# Patient Record
Sex: Male | Born: 1965 | Race: White | Hispanic: No | Marital: Married | Smoking: Former smoker
Health system: Southern US, Community
[De-identification: ages and names within clinical notes are randomized; demographics above are authoritative.]

## PROBLEM LIST (undated history)

## (undated) DIAGNOSIS — G4730 Sleep apnea, unspecified: Secondary | ICD-10-CM

## (undated) DIAGNOSIS — Z9989 Dependence on other enabling machines and devices: Secondary | ICD-10-CM

## (undated) DIAGNOSIS — E119 Type 2 diabetes mellitus without complications: Secondary | ICD-10-CM

## (undated) DIAGNOSIS — M4800 Spinal stenosis, site unspecified: Secondary | ICD-10-CM

## (undated) DIAGNOSIS — K9041 Non-celiac gluten sensitivity: Secondary | ICD-10-CM

## (undated) DIAGNOSIS — R7989 Other specified abnormal findings of blood chemistry: Secondary | ICD-10-CM

## (undated) DIAGNOSIS — K279 Peptic ulcer, site unspecified, unspecified as acute or chronic, without hemorrhage or perforation: Secondary | ICD-10-CM

## (undated) DIAGNOSIS — E785 Hyperlipidemia, unspecified: Secondary | ICD-10-CM

## (undated) DIAGNOSIS — S3992XA Unspecified injury of lower back, initial encounter: Secondary | ICD-10-CM

## (undated) DIAGNOSIS — F32A Depression, unspecified: Secondary | ICD-10-CM

## (undated) DIAGNOSIS — T7840XA Allergy, unspecified, initial encounter: Secondary | ICD-10-CM

## (undated) DIAGNOSIS — M1990 Unspecified osteoarthritis, unspecified site: Secondary | ICD-10-CM

## (undated) DIAGNOSIS — G4733 Obstructive sleep apnea (adult) (pediatric): Secondary | ICD-10-CM

## (undated) DIAGNOSIS — F329 Major depressive disorder, single episode, unspecified: Secondary | ICD-10-CM

## (undated) DIAGNOSIS — R0602 Shortness of breath: Secondary | ICD-10-CM

## (undated) DIAGNOSIS — Z6841 Body Mass Index (BMI) 40.0 and over, adult: Secondary | ICD-10-CM

## (undated) DIAGNOSIS — M21969 Unspecified acquired deformity of unspecified lower leg: Secondary | ICD-10-CM

## (undated) DIAGNOSIS — K219 Gastro-esophageal reflux disease without esophagitis: Secondary | ICD-10-CM

## (undated) HISTORY — DX: Obstructive sleep apnea (adult) (pediatric): G47.33

## (undated) HISTORY — PX: COLONOSCOPY: SHX174

## (undated) HISTORY — PX: BLADDER SURGERY: SHX569

## (undated) HISTORY — DX: Peptic ulcer, site unspecified, unspecified as acute or chronic, without hemorrhage or perforation: K27.9

## (undated) HISTORY — DX: Other specified abnormal findings of blood chemistry: R79.89

## (undated) HISTORY — DX: Depression, unspecified: F32.A

## (undated) HISTORY — DX: Major depressive disorder, single episode, unspecified: F32.9

## (undated) HISTORY — DX: Hyperlipidemia, unspecified: E78.5

## (undated) HISTORY — DX: Dependence on other enabling machines and devices: Z99.89

## (undated) HISTORY — DX: Gastro-esophageal reflux disease without esophagitis: K21.9

## (undated) HISTORY — DX: Unspecified acquired deformity of unspecified lower leg: M21.969

## (undated) HISTORY — DX: Allergy, unspecified, initial encounter: T78.40XA

## (undated) HISTORY — PX: ESOPHAGOGASTRODUODENOSCOPY: SHX1529

---

## 2006-02-26 ENCOUNTER — Emergency Department (HOSPITAL_COMMUNITY): Admission: EM | Admit: 2006-02-26 | Discharge: 2006-02-27 | Payer: Self-pay | Admitting: Emergency Medicine

## 2012-11-22 ENCOUNTER — Encounter (HOSPITAL_COMMUNITY): Payer: Self-pay | Admitting: Emergency Medicine

## 2012-11-22 ENCOUNTER — Observation Stay (HOSPITAL_COMMUNITY)
Admission: EM | Admit: 2012-11-22 | Discharge: 2012-11-23 | Disposition: A | Discharge status: Home / Self Care | Payer: BC Managed Care – PPO | Attending: Internal Medicine | Admitting: Internal Medicine

## 2012-11-22 ENCOUNTER — Other Ambulatory Visit: Payer: Self-pay

## 2012-11-22 ENCOUNTER — Emergency Department (HOSPITAL_COMMUNITY): Payer: BC Managed Care – PPO

## 2012-11-22 DIAGNOSIS — R079 Chest pain, unspecified: Secondary | ICD-10-CM | POA: Diagnosis present

## 2012-11-22 DIAGNOSIS — M4800 Spinal stenosis, site unspecified: Secondary | ICD-10-CM | POA: Insufficient documentation

## 2012-11-22 DIAGNOSIS — Z880 Allergy status to penicillin: Secondary | ICD-10-CM | POA: Insufficient documentation

## 2012-11-22 DIAGNOSIS — R0789 Other chest pain: Principal | ICD-10-CM | POA: Insufficient documentation

## 2012-11-22 DIAGNOSIS — Z87828 Personal history of other (healed) physical injury and trauma: Secondary | ICD-10-CM | POA: Insufficient documentation

## 2012-11-22 DIAGNOSIS — E669 Obesity, unspecified: Secondary | ICD-10-CM

## 2012-11-22 DIAGNOSIS — G4730 Sleep apnea, unspecified: Secondary | ICD-10-CM | POA: Diagnosis present

## 2012-11-22 DIAGNOSIS — E119 Type 2 diabetes mellitus without complications: Secondary | ICD-10-CM | POA: Diagnosis present

## 2012-11-22 DIAGNOSIS — M129 Arthropathy, unspecified: Secondary | ICD-10-CM | POA: Insufficient documentation

## 2012-11-22 HISTORY — DX: Type 2 diabetes mellitus without complications: E11.9

## 2012-11-22 HISTORY — DX: Spinal stenosis, site unspecified: M48.00

## 2012-11-22 HISTORY — DX: Morbid (severe) obesity due to excess calories: E66.01

## 2012-11-22 HISTORY — DX: Shortness of breath: R06.02

## 2012-11-22 HISTORY — DX: Sleep apnea, unspecified: G47.30

## 2012-11-22 HISTORY — DX: Unspecified osteoarthritis, unspecified site: M19.90

## 2012-11-22 HISTORY — DX: Unspecified injury of lower back, initial encounter: S39.92XA

## 2012-11-22 HISTORY — DX: Non-celiac gluten sensitivity: K90.41

## 2012-11-22 HISTORY — DX: Body Mass Index (BMI) 40.0 and over, adult: Z684

## 2012-11-22 LAB — RAPID URINE DRUG SCREEN, HOSP PERFORMED
Amphetamines: NOT DETECTED
Benzodiazepines: NOT DETECTED
Opiates: NOT DETECTED

## 2012-11-22 LAB — BASIC METABOLIC PANEL
CO2: 26 mEq/L (ref 19–32)
Calcium: 9.4 mg/dL (ref 8.4–10.5)
Creatinine, Ser: 0.75 mg/dL (ref 0.50–1.35)
GFR calc Af Amer: >90 mL/min (ref >90)
Glucose, Bld: 365 mg/dL — ABNORMAL HIGH (ref 70–99)
Potassium: 4.3 mEq/L (ref 3.5–5.1)
Sodium: 135 mEq/L (ref 135–145)

## 2012-11-22 LAB — CBC: MCV: 88.5 fL (ref 78.0–100.0)

## 2012-11-22 LAB — POCT I-STAT TROPONIN I: Troponin i, poc: 0.01 ng/mL (ref 0.00–0.08)

## 2012-11-22 LAB — GLUCOSE, CAPILLARY: Glucose-Capillary: 250 mg/dL — ABNORMAL HIGH (ref 70–99)

## 2012-11-22 LAB — PRO B NATRIURETIC PEPTIDE: Pro B Natriuretic peptide (BNP): 7.4 pg/mL (ref 0–125)

## 2012-11-22 MED ORDER — ACETAMINOPHEN 325 MG PO TABS
650.0000 mg | ORAL_TABLET | ORAL | Status: DC | PRN
  Administered 2012-11-23: 650 mg via ORAL
  Filled 2012-11-22: qty 2

## 2012-11-22 MED ORDER — ASPIRIN 81 MG PO CHEW
81.0000 mg | CHEWABLE_TABLET | Freq: Every day | ORAL | Status: DC
  Administered 2012-11-23: 81 mg via ORAL
  Filled 2012-11-22: qty 1

## 2012-11-22 MED ORDER — NITROGLYCERIN 0.4 MG SL SUBL: 0.4000 mg | SUBLINGUAL_TABLET | SUBLINGUAL | Status: DC | PRN

## 2012-11-22 MED ORDER — ONDANSETRON HCL 4 MG/2ML IJ SOLN: 4.0000 mg | Freq: Four times a day (QID) | INTRAMUSCULAR | Status: DC | PRN

## 2012-11-22 MED ORDER — INSULIN ASPART 100 UNIT/ML ~~LOC~~ SOLN: 0.0000 [IU] | Freq: Three times a day (TID) | SUBCUTANEOUS | Status: DC

## 2012-11-22 MED ORDER — MORPHINE SULFATE 2 MG/ML IJ SOLN: 2.0000 mg | INTRAMUSCULAR | Status: DC | PRN

## 2012-11-22 MED ORDER — ENOXAPARIN SODIUM 40 MG/0.4ML ~~LOC~~ SOLN
40.0000 mg | SUBCUTANEOUS | Status: DC
  Filled 2012-11-22 (×2): qty 0.4

## 2012-11-22 NOTE — ED Notes (Signed)
MD at bedside. 

## 2012-11-22 NOTE — ED Notes (Signed)
Presents with sternal chest pressure that began this afternoon while going up and down stairs at work, associated with SOB, nausea and left arm tingling, dizziness. Rest makes pain better. Pain rated 5/10.  HX DM, mother died at early age of MI.

## 2012-11-22 NOTE — ED Notes (Signed)
Pt states he has been experiencing chest pain for a couple of hours centralized. States pain feels like a pressure. Not radiating. Pt denies cardiac hx. Hx of DM. Denies SOB at this time but states he did have SOB a little earlier.

## 2012-11-22 NOTE — ED Notes (Signed)
Gave pt. Warm blanket.

## 2012-11-22 NOTE — ED Provider Notes (Signed)
CSN: [id number]     Arrival date & time 11/22/12  1750 History   First MD Initiated Contact with Patient 11/22/12 1901     Chief Complaint  Patient presents with  . Chest Pain   (Consider location/radiation/quality/duration/timing/severity/associated sxs/prior Treatment) HPI Pt reports having to walk up several flights of stairs during work todayOnset was today at 4 PM while talking on phone with wife.  The pain is mid, located to central chest, described as pressure without radiation. Modifying factors: better with rest.  Associated symptoms: nausea, left hand tingling.  Recent medical care: went to urgent care who did EKG, gave ASA and sent here.   Past Medical History  Diagnosis Date  . Diabetes mellitus without complication   . Arthritis   . Spinal stenosis   . Back injury    History reviewed. No pertinent past surgical history. History reviewed. No pertinent family history. History  Substance Use Topics  . Smoking status: Never Smoker   . Smokeless tobacco: Not on file  . Alcohol Use: No    Review of Systems Constitutional: Negative for fever.  Eyes: Negative for vision loss.  ENT: Negative for difficulty swallowing.  Cardiovascular: Positive for chest pain. Respiratory: Negative for respiratory distress.  Gastrointestinal:  Negative for vomiting.  Genitourinary: Negative for inability to void.  Musculoskeletal: Negative for gait problem.  Integumentary: Negative for rash.  Neurological: Negative for new focal weakness.     Allergies  Penicillins and Gluten meal  Home Medications  No current outpatient prescriptions on file. BP 131/73  Pulse 77  Temp(Src) 98.3 F (36.8 C) (Oral)  Resp 18  Wt 344 lb (156.037 kg)  SpO2 98% Physical Exam Nursing note and vitals reviewed.  Constitutional: Pt is alert and appears stated age. Obese.  Eyes: No injection, no scleral icterus. HENT: Atraumatic, airway open without erythema or exudate.  Respiratory: No respiratory  distress. Equal breathing bilaterally. Cardiovascular: Normal rate. Extremities warm and well perfused.  Abdomen: Soft, non-tender. MSK: Extremities are atraumatic without deformity. Skin: No rash, no wounds.   Neuro: No motor nor sensory deficit.     ED Course  Procedures (including critical care time) Labs Review Labs Reviewed  CBC - Abnormal; Notable for the following:    MCHC 36.4 (*)    All other components within normal limits  BASIC METABOLIC PANEL - Abnormal; Notable for the following:    Glucose, Bld 365 (*)    All other components within normal limits  PRO B NATRIURETIC PEPTIDE  POCT I-STAT TROPONIN I   Imaging Review Dg Chest 2 View  11/22/2012   CLINICAL DATA:  Chest pain, diabetes  EXAM: CHEST  2 VIEW  COMPARISON:  None.  FINDINGS: Cardiomediastinal silhouette is unremarkable. Mild degenerative changes thoracic spine. No acute infiltrate or pleural effusion. No pulmonary edema. Minimal thoracic dextroscoliosis.  IMPRESSION: No active disease. Minimal thoracic dextroscoliosis. Mild degenerative changes thoracic spine.   Electronically Signed   By: Natasha Mead M.D.   On: 11/22/2012 19:09    EKG Interpretation   None       MDM   1. Chest pain    47 y.o. male w/ PMHx of DM, obesity, family h/o cardiac disease presents w/ chest pain concerning for possible ACS. Pt medium risk. HEART score 4 given moderately suspicious story, non specific EKG changes, 3 risk factors. Troponin normal. CXR normal. Other labs normal. Not c/w PE or dissection. Received aspirin at urgent care. Pt does not complain of chest pain here. Pt  and wife updated. Plan for chest pain observation. Medicine called.       I independently viewed, interpreted, and used in my medical decision making all ordered lab and imaging tests. Medical Decision Making discussed with ED attending Darlys Gales, MD      Charm Barges, MD 11/22/12 2026

## 2012-11-22 NOTE — H&P (Signed)
Date: 11/22/2012               Patient Name:  Caleb White MRN: [id number]  DOB: [birth year] Age / Sex: 47 y.o., male   PCP: No primary provider on file.         Medical Service: Internal Medicine Teaching Service         Attending Physician: Dr. Aletta Edouard, MD    First Contact: Dr. Andrey Campanile Pager: [phone]  Second Contact: Dr. Burtis Junes Pager: [phone]       After Hours (After 5p/  First Contact Pager: [phone]  weekends / holidays): Second Contact Pager: [phone]   Chief Complaint: Chest Pain  History of Present Illness: Caleb White is a 47 year old male with a PMH of uncontrolled T2DM, HTN, OSA, Modbid obesity.  He presents today to [facility] after being sent from Urgent care for chest pain.  He reports that earlier this afternoon he had to walk up and down several flights of stairs at work (this is not all that unusual for him) after work he was talking on the phone to his wife who reported he was breathing differently.  He did admit some chest pain to her and she had him go to UC.  He reports that the chest pain was substernal, located in mid chest and 7/10 at worst, it did not radiate.  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  He also notes that his pain is worse with deep inspiration.  At the time of chest pain he does admit dizziness, light headedness, mild nausea.  He reports that the chest pain resolved at Urgent Care after receiving ASA and being placed on supplemental O2.  He currently denies any chest pain unless he takes a deep breath.  His wife reports he seems to be SOB or breath differently about once a week.  However he reports that the last time he had chest pain like this was about 8 years ago.  He was seen at [facility] for this pain and reported that his enzymes were "weird" but that everything was fine with his heart.  He denies any history of cardiac  catheretization or stress testing. He reports the last time he saw his PCP was about 3-4 years ago.  He does not currently take any prescription medications but has been prescribed Glipizide, metformin, tramadol, enalapril, and androgel in the past.  He reports he stopped taking metformin due to his gluten sensitivity and that it gave him similar side effects.   Meds: No current facility-administered medications for this encounter.   No current outpatient prescriptions on file.    Allergies: Allergies as of 11/22/2012 - Review Complete 11/22/2012  Allergen Reaction Noted  . Penicillins Anaphylaxis 11/22/2012  . Gluten meal  11/22/2012   Past Medical History  Diagnosis Date  . Diabetes mellitus without complication   . Spinal stenosis   . Back injury     per patient, history of ruptured or torn disk   . Sleep apnea     on CPAP  . Arthritis     per patient  . Gluten intolerance     self diagnosed, no h/o biopsy or blood testing  . Morbid obesity with BMI of 50.0-59.9, adult    Past Surgical History  Procedure Laterality Date  . Bladder surgery      at 47 years old, patient  unaware of details   Family History  Problem Relation Age of Onset  . Heart attack Mother   . Heart failure Mother   . Heart attack Father   . Heart disease Brother   . Diabetes Mother   . Diabetes Brother   . Hypertension Father   . Clotting disorder Paternal Aunt     Blood clot after minor surgery  . Clotting disorder Paternal Grandmother     Blood clot after minor surgery   History   Social History  . Marital Status: Single    Spouse Name: Loleta Chance    Number of Children: 3  . Years of Education: college   Occupational History  . Sign language interpretor    Social History Main Topics  . Smoking status: Former Smoker -- 1.00 packs/day for 7 years    Types: Cigarettes    Quit date: 09/22/2012  . Smokeless tobacco: Not on file  . Alcohol Use: Yes     Comment: Rarely  . Drug Use:  No  . Sexual Activity: Yes    Partners: Female   Other Topics Concern  . Not on file   Social History Narrative  . No narrative on file    Review of Systems: Review of Systems  Constitutional: Positive for diaphoresis. Negative for fever, chills, weight loss and malaise/fatigue.  HENT: Negative for tinnitus.   Eyes: Negative for blurred vision.  Respiratory: Positive for shortness of breath. Negative for cough, sputum production and wheezing.   Cardiovascular: Positive for chest pain and leg swelling. Negative for palpitations.  Gastrointestinal: Positive for nausea and diarrhea. Negative for heartburn, vomiting, abdominal pain, constipation, blood in stool and melena.  Genitourinary: Positive for frequency. Negative for dysuria and urgency.  Musculoskeletal: Positive for back pain (chronic) and joint pain (chronic). Negative for myalgias.  Neurological: Negative for dizziness, tingling, loss of consciousness and headaches.  Endo/Heme/Allergies: Negative for polydipsia.  Psychiatric/Behavioral: Negative for depression and substance abuse. The patient is not nervous/anxious.      Physical Exam: Blood pressure 131/73, pulse 77, temperature 98.3 F (36.8 C), temperature source Oral, resp. rate 18, height 5\' 7"  (1.702 m), weight 344 lb (156.037 kg), SpO2 98.00%. on RA Physical Exam  Nursing note and vitals reviewed. Constitutional: He is oriented to person, place, and time and well-developed, well-nourished, and in no distress. No distress.  HENT:  Head: Normocephalic and atraumatic.  Eyes: EOM are normal.  Cardiovascular: Normal rate, regular rhythm, normal heart sounds and intact distal pulses.   No murmur heard. Pulmonary/Chest: Effort normal and breath sounds normal. No respiratory distress. He has no wheezes. He has no rales. He exhibits no tenderness.  Abdominal: Soft. Bowel sounds are normal. He exhibits no distension. There is tenderness (Left subchrondral tenderness with  deep palpation). There is no rebound and no guarding.  Musculoskeletal: He exhibits no edema.  Neurological: He is alert and oriented to person, place, and time.  Skin: Skin is warm and dry. He is not diaphoretic.  Psychiatric: Mood, memory and affect normal.    Lab results: Basic Metabolic Panel:  Recent Labs  16/10/96 1820  NA 135  K 4.3  CL 98  CO2 26  GLUCOSE 365*  BUN 8  CREATININE 0.75  CALCIUM 9.4  AG: 11 CBC:  Recent Labs  11/22/12 1820  WBC 6.8  HGB 16.5  HCT 45.3  MCV 88.5  PLT 259   BNP:  Recent Labs  11/22/12 1820  PROBNP 7.4    Imaging results:  Dg Chest 2 View  11/22/2012   CLINICAL DATA:  Chest pain, diabetes  EXAM: CHEST  2 VIEW  COMPARISON:  None.  FINDINGS: Cardiomediastinal silhouette is unremarkable. Mild degenerative changes thoracic spine. No acute infiltrate or pleural effusion. No pulmonary edema. Minimal thoracic dextroscoliosis.  IMPRESSION: No active disease. Minimal thoracic dextroscoliosis. Mild degenerative changes thoracic spine.   Electronically Signed   By: Natasha Mead M.D.   On: 11/22/2012 19:09    Other results: EKG: NSR, Rate 80, Normal Axis, T wave inversion in lead III, no available for comparison   Assessment & Plan by Problem: [name] is a 47 yo male with PMH of uncontrolled DM, HTN, OSA, Obesity, who presents with chest pain and SOB at rest. #  Chest pain at rest -DDx Cardiovascular, MSK (less likely given presentation and non reproducible), GI (denies history of heartburn or metallic taste), Pulmonary (Wells Score 0, No history of Asthma/COPD or evidence on CXR), Skin (No rash, short duration), Referred pain. -  Patient has multiple risk factors for CAD, has a TIMI risk score of 1 for UA/MI -  Admit to Telemetry (observation) - Cycle cardiac enzymes x3. - AM EKG - Nitroglycerin SL PRN - Risk stratify with A1C and Lipid Profile - UDS -  If ACS ruled out, consider cardiac cath vs stress test to evaluate for CAD  in this high risk patient. - AM CMP to evaluate hepatic function if statin is prescribed on D/C. #  Diabetes mellitus, type 2 -  Hx of DM per patient, BG of 365 no AG.  Will start Carb mod diet, check A1C. Encourage patient to restart diabetes medications as outpatient. - SSI-R due to obesity #  Sleep apnea -CPAP #  HTN - Continue to monitor, consider restarting ACEi on discharge given DM. # ?Gluten Intolerance - Will order gluten free diet  Dispo: Disposition is deferred at this time, awaiting improvement of current medical problems. Anticipated discharge in approximately 1 day(s).   The patient does have a current PCP (No primary provider on file.) and does not need an [facility] hospital follow-up appointment after discharge.  The patient does not have transportation limitations that hinder transportation to clinic appointments.  Signed: Carlynn Purl, DO 11/22/2012, 9:02 PM

## 2012-11-22 NOTE — ED Notes (Signed)
Consulting MD at bedside

## 2012-11-23 DIAGNOSIS — I10 Essential (primary) hypertension: Secondary | ICD-10-CM

## 2012-11-23 LAB — GLUCOSE, CAPILLARY
Glucose-Capillary: 188 mg/dL — ABNORMAL HIGH (ref 70–99)
Glucose-Capillary: 238 mg/dL — ABNORMAL HIGH (ref 70–99)

## 2012-11-23 LAB — LIPID PANEL
HDL: 39 mg/dL — ABNORMAL LOW (ref >39)
LDL Cholesterol: 66 mg/dL (ref 0–99)
Total CHOL/HDL Ratio: 3.3 RATIO
Triglycerides: 118 mg/dL (ref <150)
VLDL: 24 mg/dL (ref 0–40)

## 2012-11-23 LAB — COMPREHENSIVE METABOLIC PANEL
ALT: 21 U/L (ref 0–53)
AST: 16 U/L (ref 0–37)
Albumin: 3.4 g/dL — ABNORMAL LOW (ref 3.5–5.2)
Calcium: 8.8 mg/dL (ref 8.4–10.5)
Creatinine, Ser: 0.73 mg/dL (ref 0.50–1.35)
GFR calc Af Amer: >90 mL/min (ref >90)
Glucose, Bld: 200 mg/dL — ABNORMAL HIGH (ref 70–99)
Sodium: 137 mEq/L (ref 135–145)
Total Protein: 6.2 g/dL (ref 6.0–8.3)

## 2012-11-23 LAB — TROPONIN I: Troponin I: <0.3 ng/mL (ref <0.30)

## 2012-11-23 LAB — HEMOGLOBIN A1C: Hgb A1c MFr Bld: 9.8 % — ABNORMAL HIGH (ref <5.7)

## 2012-11-23 MED ORDER — GLIPIZIDE 5 MG PO TABS: 5.0000 mg | ORAL_TABLET | Freq: Every day | ORAL | Status: DC

## 2012-11-23 MED ORDER — ASPIRIN 81 MG PO CHEW: 81.0000 mg | CHEWABLE_TABLET | Freq: Every day | ORAL | Status: DC

## 2012-11-23 MED ORDER — ENOXAPARIN SODIUM 80 MG/0.8ML ~~LOC~~ SOLN
80.0000 mg | SUBCUTANEOUS | Status: DC
  Filled 2012-11-23: qty 0.8

## 2012-11-23 MED ORDER — DIPHENHYDRAMINE HCL 50 MG PO CAPS
50.0000 mg | ORAL_CAPSULE | Freq: Once | ORAL | Status: AC
  Administered 2012-11-23: 50 mg via ORAL
  Filled 2012-11-23: qty 1
  Filled 2012-11-23: qty 2

## 2012-11-23 NOTE — Discharge Summary (Signed)
Patient Name:  Caleb White  MRN: [id number]  PCP: No PCP Per Patient  DOB:  [birth year]       Date of Admission:  11/22/2012  Date of Discharge:  11/23/2012      Attending Physician: Dr. Aletta Edouard, MD         DISCHARGE DIAGNOSES: 1.   Chest pain at rest 2.   Diabetes mellitus, type 2 3.   Morbid obesity with BMI of 50.0-59.9, adult 4.   Sleep apnea   DISPOSITION AND FOLLOW-UP: Caleb White is to follow-up with the listed providers as detailed below, at patient's visiting, please address following issues:  1) Needs outpatient cardiology referral for stress test 2) DM - is his DM being controlled on 5mg  Glipizide   Follow-up Information   Schedule an appointment as soon as possible for a visit with BOUCHERLE, AMY, PA.   Specialty:  Internal Medicine   Contact information:   [address]  [place] [address] [phone]      Discharge Orders   Future Orders Complete By Expires   Call MD for:  difficulty breathing, headache or visual disturbances  As directed    Call MD for:  extreme fatigue  As directed    Call MD for:  persistant dizziness or light-headedness  As directed    Call MD for:  persistant nausea and vomiting  As directed    Call MD for:  severe uncontrolled pain  As directed    Call MD for:  temperature >100.4  As directed    Diet - low sodium heart healthy  As directed    Increase activity slowly  As directed        DISCHARGE MEDICATIONS:   Medication List         aspirin 81 MG chewable tablet  Chew 1 tablet (81 mg total) by mouth daily.     glipiZIDE 5 MG tablet  Commonly known as:  GLUCOTROL  Take 1 tablet (5 mg total) by mouth daily before breakfast.     ibuprofen 200 MG tablet  Commonly known as:  ADVIL,MOTRIN  Take 200-400 mg by mouth as needed for moderate pain.         CONSULTS:    None   PROCEDURES PERFORMED:  Dg Chest 2 View  11/22/2012   CLINICAL DATA:  Chest pain, diabetes  EXAM:  CHEST  2 VIEW  COMPARISON:  None.  FINDINGS: Cardiomediastinal silhouette is unremarkable. Mild degenerative changes thoracic spine. No acute infiltrate or pleural effusion. No pulmonary edema. Minimal thoracic dextroscoliosis.  IMPRESSION: No active disease. Minimal thoracic dextroscoliosis. Mild degenerative changes thoracic spine.   Electronically Signed   By: Natasha Mead M.D.   On: 11/22/2012 19:09      ADMISSION DATA: H&P: History of Present Illness: Caleb White is a 47 year old male with a PMH of uncontrolled T2DM, HTN, OSA, Modbid obesity. He presents today to [facility] after being sent from Urgent care for chest pain. He reports that earlier this afternoon he had to walk up and down several flights of stairs at work (this is not all that unusual for him) after work he was talking on the phone to his wife who reported he was breathing differently. He did admit some chest pain to her and she had him go to UC. He reports that the chest pain was substernal, located in mid chest and 7/10 at worst, it did not radiate. 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 He also notes that his pain is worse with deep inspiration. At the time of chest pain he does admit dizziness, light headedness, mild nausea. He reports that the chest pain resolved at Urgent Care after receiving ASA and being placed on supplemental O2. He currently denies any chest pain unless he takes a deep breath. His wife reports he seems to be SOB or breath differently about once a week. However he reports that the last time he had chest pain like this was about 8 years ago. He was seen at [facility] for this pain and reported that his enzymes were "weird" but that everything was fine with his heart. He denies any history of cardiac catheretization or stress testing.  He reports the last time he saw his PCP was about 3-4 years ago. He does not  currently take any prescription medications but has been prescribed Glipizide, metformin, tramadol, enalapril, and androgel in the past. He reports he stopped taking metformin due to his gluten sensitivity and that it gave him similar side effects.  Physical Exam: Blood pressure 131/73, pulse 77, temperature 98.3 F (36.8 C), temperature source Oral, resp. rate 18, height 5\' 7"  (1.702 m), weight 344 lb (156.037 kg), SpO2 98.00%. on RA  Physical Exam  Nursing note and vitals reviewed.  Constitutional: He is oriented to person, place, and time and well-developed, well-nourished, and in no distress. No distress.  HENT:  Head: Normocephalic and atraumatic.  Eyes: EOM are normal.  Cardiovascular: Normal rate, regular rhythm, normal heart sounds and intact distal pulses.  No murmur heard.  Pulmonary/Chest: Effort normal and breath sounds normal. No respiratory distress. He has no wheezes. He has no rales. He exhibits no tenderness.  Abdominal: Soft. Bowel sounds are normal. He exhibits no distension. There is tenderness (Left subchrondral tenderness with deep palpation). There is no rebound and no guarding.  Musculoskeletal: He exhibits no edema.  Neurological: He is alert and oriented to person, place, and time.  Skin: Skin is warm and dry. He is not diaphoretic.  Psychiatric: Mood, memory and affect normal.   Labs: Basic Metabolic Panel:   Recent Labs   11/22/12 1820   NA  135   K  4.3   CL  98   CO2  26   GLUCOSE  365*   BUN  8   CREATININE  0.75   CALCIUM  9.4   AG: 11  CBC:   Recent Labs   11/22/12 1820   WBC  6.8   HGB  16.5   HCT  45.3   MCV  88.5   PLT  259    BNP:   Recent Labs   11/22/12 1820   PROBNP  7.4     HOSPITAL COURSE: Chest pain rule out ACS [name] was observed overnight on telemetry given his symptoms and multiple risk factors for CAD (including DM, HTN, obesity) and non-compliance with medications.  ACS work-up was negative, including  negative cardiac enzymes and patient was symptom-free during his hospitalization.  He had no electrolyte abnormalities or arrhythmias on telemetry.  He was feeling well and was discharged home in stable condition.  The patient has already scheduled a hospital follow-up appointment with his new PCP, Amy Boucherle PA-C for Friday, 29 November 2012.  At that visit he can be referred for stress test.   Diabetes mellitus, type 2  [name] reported a history of DM type 2 and his BG was 365. He reported having been prescribed Glipizide and metformin in the past but reports he was not currently taking these medications.  He reported stopping the metformin because it aggravated his Celiac's.  Blood glucose in hospital were 180s-250 without treatment so he was discharged on Glipizide 5mg  until he can follow-up with PCP.  Metformin was not prescribed at discharge since he reports intolerance.  HTN - normotensive without medications during hospitalization.  Patient reports non-compliant with medications.  BP 126/61 at discharge.   DISCHARGE DATA: Vital Signs: BP 126/61  Pulse 71  Temp(Src) 98.5 F (36.9 C) (Oral)  Resp 17  Ht 5\' 7"  (1.702 m)  Wt 156.037 kg (344 lb)  BMI 53.87 kg/m2  SpO2 98%  Labs: Results for orders placed during the hospital encounter of 11/22/12 (from the past 24 hour(s))  CBC     Status: Abnormal   Collection Time    11/22/12  6:20 PM      Result Value Range   WBC 6.8  4.0 - 10.5 K/uL   RBC 5.12  4.22 - 5.81 MIL/uL   Hemoglobin 16.5  13.0 - 17.0 g/dL   HCT 16.1  09.6 - 04.5 %   MCV 88.5  78.0 - 100.0 fL   MCH 32.2  26.0 - 34.0 pg   MCHC 36.4 (*) 30.0 - 36.0 g/dL   RDW 40.9  81.1 - 91.4 %   Platelets 259  150 - 400 K/uL  BASIC METABOLIC PANEL     Status: Abnormal   Collection Time    11/22/12  6:20 PM      Result Value Range   Sodium 135  135 - 145 mEq/L   Potassium 4.3  3.5 - 5.1 mEq/L   Chloride 98  96 - 112 mEq/L   CO2 26  19 - 32 mEq/L   Glucose, Bld 365  (*) 70 - 99 mg/dL   BUN 8  6 - 23 mg/dL   Creatinine, Ser 7.82  0.50 - 1.35 mg/dL   Calcium 9.4  8.4 - 95.6 mg/dL   GFR calc non Af Amer >90  >90 mL/min   GFR calc Af Amer >90  >90 mL/min  PRO B NATRIURETIC PEPTIDE     Status: None   Collection Time    11/22/12  6:20 PM      Result Value Range   Pro B Natriuretic peptide (BNP) 7.4  0 - 125 pg/mL  POCT I-STAT TROPONIN I     Status: None   Collection Time    11/22/12  6:58 PM      Result Value Range   Troponin i, poc 0.01  0.00 - 0.08 ng/mL   Comment 3           URINE RAPID DRUG SCREEN (HOSP PERFORMED)     Status: None   Collection Time    11/22/12  8:41 PM      Result Value Range   Opiates NONE DETECTED  NONE DETECTED   Cocaine NONE DETECTED  NONE DETECTED   Benzodiazepines NONE DETECTED  NONE DETECTED   Amphetamines NONE DETECTED  NONE DETECTED   Tetrahydrocannabinol NONE DETECTED  NONE DETECTED   Barbiturates NONE DETECTED  NONE DETECTED  GLUCOSE, CAPILLARY     Status: Abnormal   Collection Time    11/22/12  9:52 PM      Result Value Range   Glucose-Capillary 250 (*) 70 - 99  mg/dL   Comment 1 Notify RN    TROPONIN I     Status: None   Collection Time    11/23/12 12:10 AM      Result Value Range   Troponin I <0.30  <0.30 ng/mL  TROPONIN I     Status: None   Collection Time    11/23/12  5:30 AM      Result Value Range   Troponin I <0.30  <0.30 ng/mL  COMPREHENSIVE METABOLIC PANEL     Status: Abnormal   Collection Time    11/23/12  5:30 AM      Result Value Range   Sodium 137  135 - 145 mEq/L   Potassium 3.6  3.5 - 5.1 mEq/L   Chloride 102  96 - 112 mEq/L   CO2 22  19 - 32 mEq/L   Glucose, Bld 200 (*) 70 - 99 mg/dL   BUN 9  6 - 23 mg/dL   Creatinine, Ser 1.61  0.50 - 1.35 mg/dL   Calcium 8.8  8.4 - 09.6 mg/dL   Total Protein 6.2  6.0 - 8.3 g/dL   Albumin 3.4 (*) 3.5 - 5.2 g/dL   AST 16  0 - 37 U/L   ALT 21  0 - 53 U/L   Alkaline Phosphatase 59  39 - 117 U/L   Total Bilirubin 0.4  0.3 - 1.2 mg/dL   GFR calc  non Af Amer >90  >90 mL/min   GFR calc Af Amer >90  >90 mL/min  LIPID PANEL     Status: Abnormal   Collection Time    11/23/12  5:38 AM      Result Value Range   Cholesterol 129  0 - 200 mg/dL   Triglycerides 045  <409 mg/dL   HDL 39 (*) >81 mg/dL   Total CHOL/HDL Ratio 3.3     VLDL 24  0 - 40 mg/dL   LDL Cholesterol 66  0 - 99 mg/dL  GLUCOSE, CAPILLARY     Status: Abnormal   Collection Time    11/23/12  8:09 AM      Result Value Range   Glucose-Capillary 188 (*) 70 - 99 mg/dL   Comment 1 Documented in Chart     Comment 2 Notify RN       Services Ordered on Discharge: Y = Yes; Blank = No PT:   OT:   RN:   Equipment:   Other:      Time Spent on Discharge: 35 min   Signed: Evelena Peat PGY 1, Internal Medicine Resident 11/23/2012, 11:57 AM

## 2012-11-23 NOTE — Progress Notes (Addendum)
Subjective: [name] was seen and examined by me this morning.  He is feeling better and denies CP/SOB.  His appetite is normal.  He has already scheduled a follow-up appointment with a new PCP for next Friday.  Objective: Vital signs in last 24 hours: Filed Vitals:   11/22/12 2140 11/23/12 0023 11/23/12 0508 11/23/12 0804  BP: 150/97  98/68 99/44  Pulse: 70 75 60 71  Temp: 98 F (36.7 C)  97.7 F (36.5 C) 97.7 F (36.5 C)  TempSrc: Oral  Oral Oral  Resp: 20 19 18 18   Height: 5\' 7"  (1.702 m)     Weight: 156.037 kg (344 lb)     SpO2: 97% 98% 96% 98%   Weight change:  No intake or output data in the 24 hours ending 11/23/12 1128  General: resting in bed in NAD Cardiac: RRR, no rubs, murmurs or gallops Pulm: clear to auscultation bilaterally, moving normal volumes of air Abd: soft, obese, nontender, nondistended, BS present Ext: warm and well perfused, 3+ B/L lower extremity edema Neuro: alert and oriented X3, mentating appropriately  Lab Results: Basic Metabolic Panel:  Recent Labs Lab 11/22/12 1820 11/23/12 0530  NA 135 137  K 4.3 3.6  CL 98 102  CO2 26 22  GLUCOSE 365* 200*  BUN 8 9  CREATININE 0.75 0.73  CALCIUM 9.4 8.8   Liver Function Tests:  Recent Labs Lab 11/23/12 0530  AST 16  ALT 21  ALKPHOS 59  BILITOT 0.4  PROT 6.2  ALBUMIN 3.4*   CBC:  Recent Labs Lab 11/22/12 1820  WBC 6.8  HGB 16.5  HCT 45.3  MCV 88.5  PLT 259   Cardiac Enzymes:  Recent Labs Lab 11/23/12 0010 11/23/12 0530  TROPONINI <0.30 <0.30   BNP:  Recent Labs Lab 11/22/12 1820  PROBNP 7.4   CBG:  Recent Labs Lab 11/22/12 2152 11/23/12 0809  GLUCAP 250* 188*   Fasting Lipid Panel:  Recent Labs Lab 11/23/12 0538  CHOL 129  HDL 39*  LDLCALC 66  TRIG 161  CHOLHDL 3.3   Urine Drug Screen: Drugs of Abuse     Component Value Date/Time   LABOPIA NONE DETECTED 11/22/2012 2041   COCAINSCRNUR NONE DETECTED 11/22/2012 2041   LABBENZ NONE DETECTED  11/22/2012 2041   AMPHETMU NONE DETECTED 11/22/2012 2041   THCU NONE DETECTED 11/22/2012 2041   LABBARB NONE DETECTED 11/22/2012 2041     Studies/Results: Dg Chest 2 View  11/22/2012   CLINICAL DATA:  Chest pain, diabetes  EXAM: CHEST  2 VIEW  COMPARISON:  None.  FINDINGS: Cardiomediastinal silhouette is unremarkable. Mild degenerative changes thoracic spine. No acute infiltrate or pleural effusion. No pulmonary edema. Minimal thoracic dextroscoliosis.  IMPRESSION: No active disease. Minimal thoracic dextroscoliosis. Mild degenerative changes thoracic spine.   Electronically Signed   By: Natasha Mead M.D.   On: 11/22/2012 19:09   Medications: I have reviewed the patient's current medications. Scheduled Meds: . aspirin  81 mg Oral Daily  . enoxaparin (LOVENOX) injection  40 mg Subcutaneous Q24H  . insulin aspart  0-20 Units Subcutaneous TID WC   Continuous Infusions:  PRN Meds:.acetaminophen, morphine injection, nitroGLYCERIN, ondansetron (ZOFRAN) IV  Assessment/Plan: [name] is a 47 yo male with PMH of uncontrolled DM, HTN, OSA, Obesity, who presents with chest pain and SOB at rest.   # Chest pain at rest  -DDx Cardiovascular, MSK (less likely given presentation and non reproducible), GI (denies history of heartburn or metallic taste), Pulmonary (  Wells Score 0, No history of Asthma/COPD or evidence on CXR), Skin (No rash, short duration), Referred pain.  Patient has multiple risk factors for CAD, has a TIMI risk score of 1 for UA/MI.  ACS workup neg, CE neg x 3.   - Outpatient cardiac follow-up - consider cardiac cath vs stress test to evaluate for CAD in this high risk patient.   # Diabetes mellitus, type 2 - Hx of DM per patient, BG 180s-200s on resistant SSI - Dicharge on glipizide 5mg  immediate release - outpatient follow-up with PCP on 11/29/12  # Sleep apnea  -CPAP   # HTN - normotensive without BP medications  Dispo: ACS ruled out, patient stable and feeling ready to go  home.  Plan for d/c this afternoon with rx for Glipizide 5mg  daily.  Follow-up appointment scheduled by patient with PA at [facility] next Friday, 11/29/12.   The patient does have a current PCP (No Pcp Per Patient) and does need an [facility] hospital follow-up appointment after discharge.  The patient does not have transportation limitations that hinder transportation to clinic appointments.  .Services Needed at time of discharge: Y = Yes, Blank = No PT:   OT:   RN:   Equipment:   Other:     LOS: 1 day   Evelena Peat, DO 11/23/2012, 11:28 AM

## 2012-11-23 NOTE — H&P (Signed)
   The patient, Caleb White, is a 47 y.o. year old male, with past medical history of type 2 DM, HTN, OSA and morbid obesity, who comes in with the chief complaint of chest pain. He has not been taking any of his medications for the past many years. Past medical history, social history and medications have been reviewed.  Review of systems as per HPI and resident note. On my exam today, the patient was well, with no more CP. Exam positive for 1+ pedal edema and morbid obesity.   I have read the documentation on this case by Dr. Mikey Bussing and agree with it with the following additions/observations:    Chest pain - ruled out of ACS. No electrolyte abnormalities or arrhythmias notices overnight. No signs or symptoms and Pro BNP suggest HF. Patient to be risk stratified and followed up for stress test as outpatient. Ok to discharge if stable vitals.  Diabetes - Patient to be started on his home medications, metformin and glipizide for now until he is able to see a PCP on the outside.   Disposition - The patient has already booked an appointment with a physician of his choice on the outside and information to be sent to that office as well as given to the patient.   Leg swelling - likely venous statis related. Advised leg elevation and further follow up with PCP.     Counseled on compliance and weight loss for more than 20 minutes.

## 2012-11-23 NOTE — ED Provider Notes (Signed)
I saw and evaluated the patient, reviewed the resident's note and I agree with the findings and plan.  EKG Interpretation     Ventricular Rate:    PR Interval:    QRS Duration:   QT Interval:    QTC Calculation:   R Axis:     Text Interpretation:               Date: 11/23/2012  Rate: 80  Rhythm: normal sinus rhythm  QRS Axis: normal  Intervals: normal  ST/T Wave abnormalities: normal  Conduction Disutrbances:none  Narrative Interpretation:   Old EKG Reviewed: none available  CP in pt with cardiac risk factors.  Medicine admit for r/o.  VSS.  No issue during ER stay.    Darlys Gales, MD 11/23/12 [phone]

## 2012-11-29 NOTE — Discharge Summary (Signed)
I saw this patient on 11/22/12 and wrote a note. Please refer to that note.  I have read the discharge summary by Dr. Andrey Campanile and agree with the documentation.

## 2012-12-09 LAB — MICROALBUMIN, URINE: MICROALB UR: 3

## 2012-12-16 ENCOUNTER — Other Ambulatory Visit: Payer: Self-pay | Admitting: Internal Medicine

## 2012-12-20 ENCOUNTER — Other Ambulatory Visit: Payer: Self-pay | Admitting: Internal Medicine

## 2013-04-07 ENCOUNTER — Other Ambulatory Visit: Payer: Self-pay | Admitting: Internal Medicine

## 2013-04-07 DIAGNOSIS — E236 Other disorders of pituitary gland: Secondary | ICD-10-CM

## 2013-04-21 LAB — HEMOGLOBIN A1C: Hemoglobin A1C: 6.9

## 2013-04-30 ENCOUNTER — Other Ambulatory Visit: Payer: Self-pay | Admitting: Pain Medicine

## 2013-04-30 DIAGNOSIS — M25572 Pain in left ankle and joints of left foot: Secondary | ICD-10-CM

## 2013-05-01 ENCOUNTER — Ambulatory Visit
Admission: RE | Admit: 2013-05-01 | Discharge: 2013-05-01 | Disposition: A | Discharge status: Home / Self Care | Payer: BC Managed Care – PPO | Source: Ambulatory Visit | Attending: Internal Medicine | Admitting: Internal Medicine

## 2013-05-01 ENCOUNTER — Ambulatory Visit
Admission: RE | Admit: 2013-05-01 | Discharge: 2013-05-01 | Disposition: A | Discharge status: Home / Self Care | Payer: BC Managed Care – PPO | Source: Ambulatory Visit | Attending: Pain Medicine | Admitting: Pain Medicine

## 2013-05-01 DIAGNOSIS — E236 Other disorders of pituitary gland: Secondary | ICD-10-CM

## 2013-05-01 DIAGNOSIS — M25572 Pain in left ankle and joints of left foot: Secondary | ICD-10-CM

## 2013-05-01 MED ORDER — GADOBENATE DIMEGLUMINE 529 MG/ML IV SOLN
10.0000 mL | Freq: Once | INTRAVENOUS | Status: AC | PRN
  Administered 2013-05-01: 10 mL via INTRAVENOUS

## 2015-04-19 IMAGING — CR DG CHEST 2V
2 series · 2 of 2 positions shown · non-contrast
Comparison: None.

CLINICAL DATA: Chest pain, diabetes

EXAM:
CHEST  2 VIEW

[w chest pa]
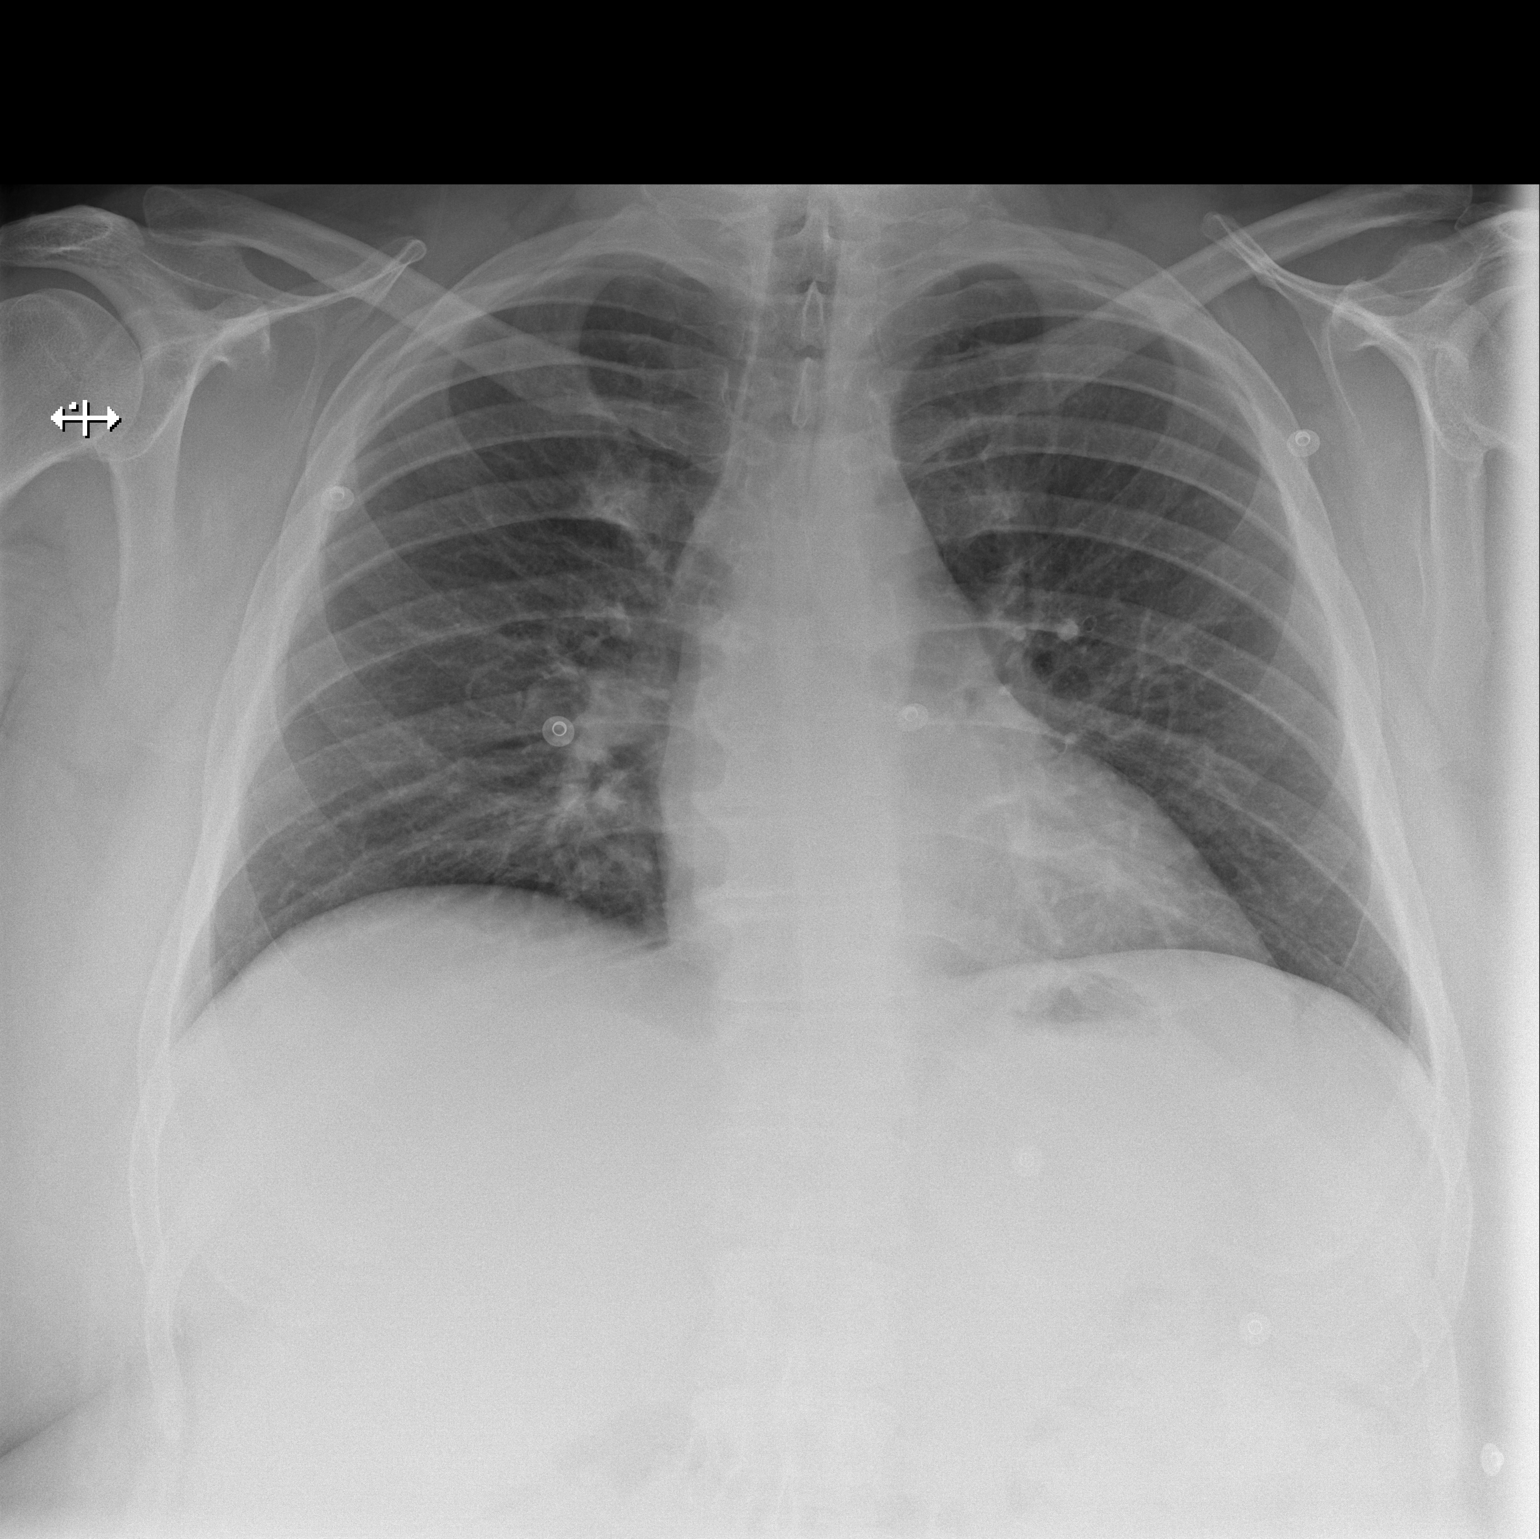

[w chest lat]
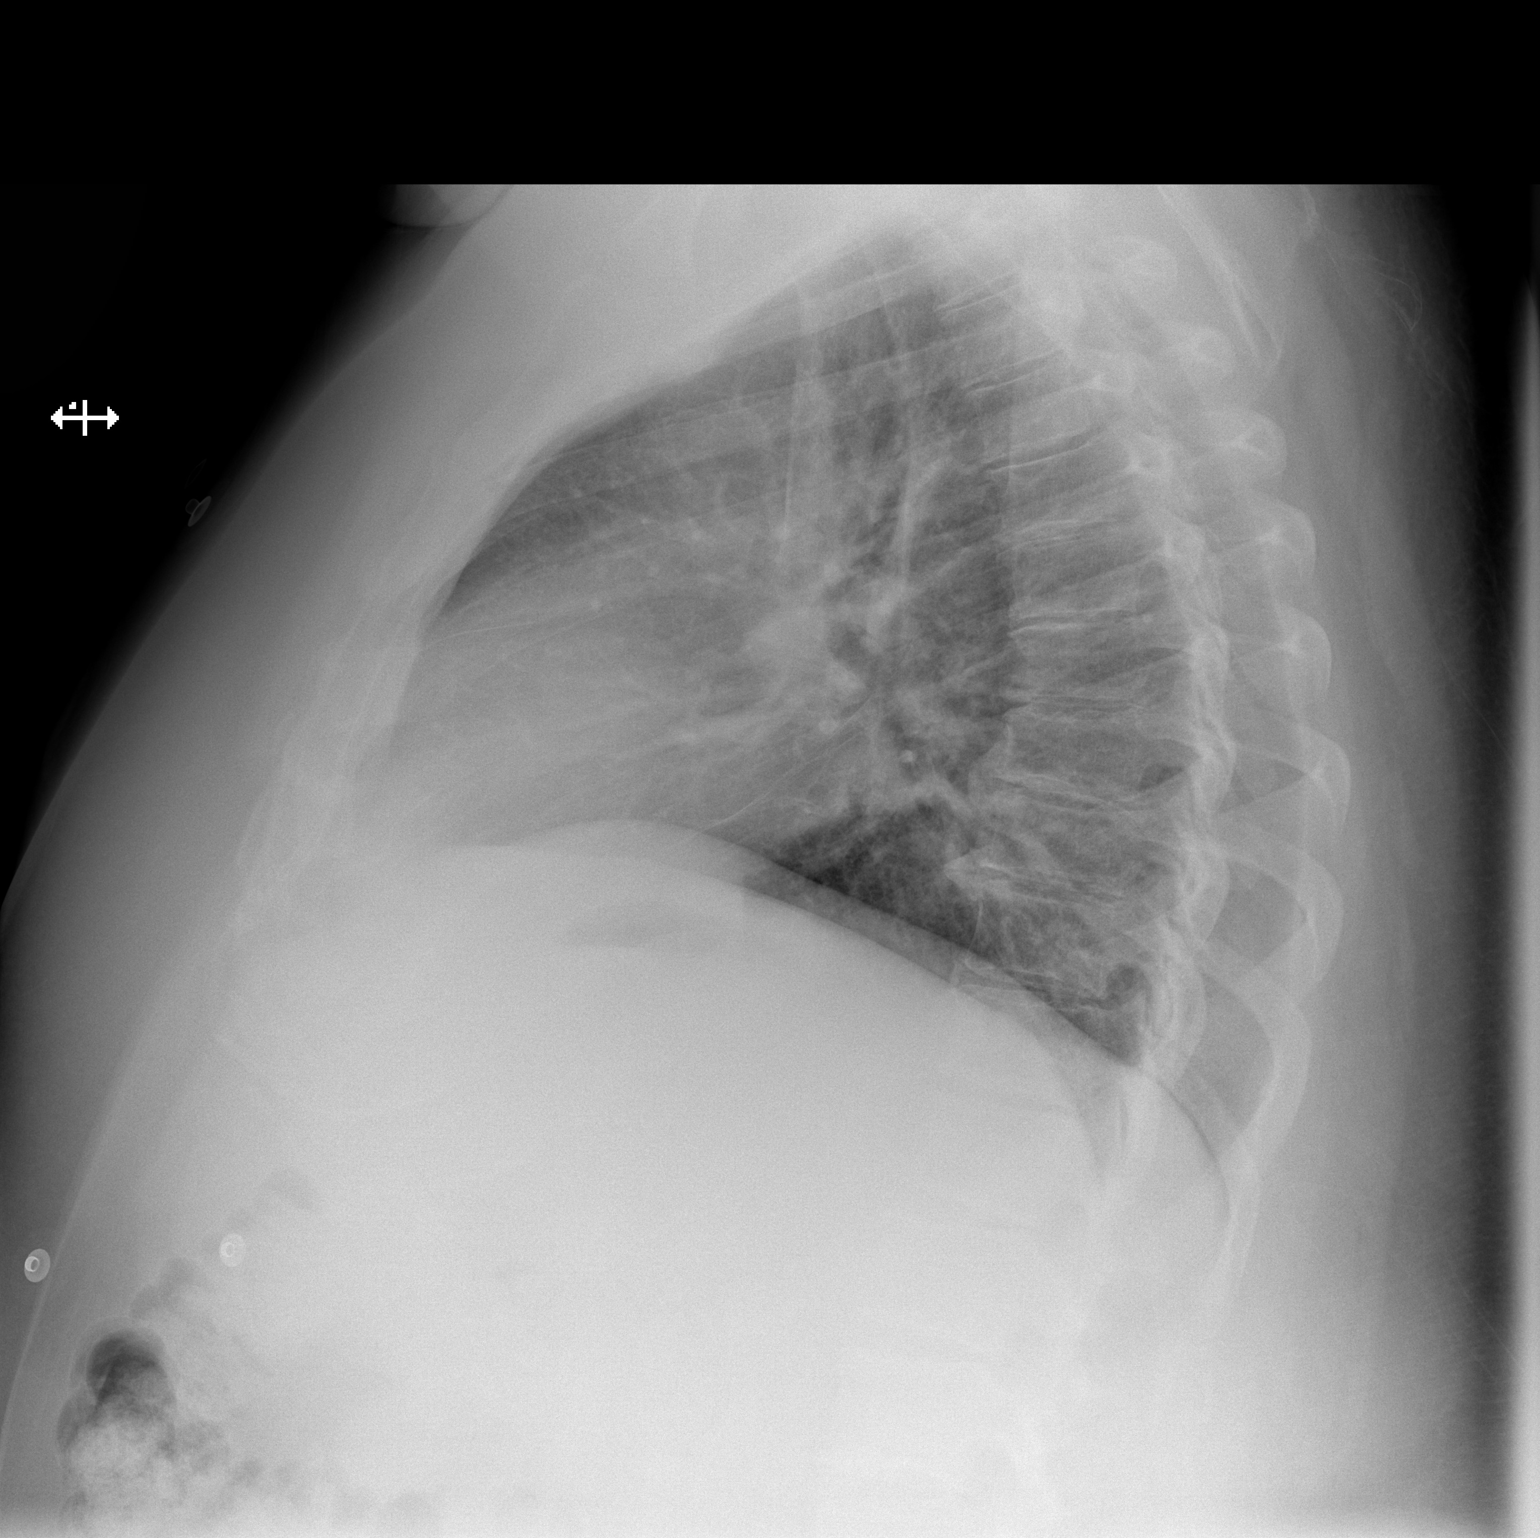

[2 of 2 positions shown; findings below may reference images not displayed]

FINDINGS: Cardiomediastinal silhouette is unremarkable. Mild degenerative
changes thoracic spine. No acute infiltrate or pleural effusion. No
pulmonary edema. Minimal thoracic dextroscoliosis.
IMPRESSION: No active disease. Minimal thoracic dextroscoliosis. Mild
degenerative changes thoracic spine.

## 2015-04-27 DIAGNOSIS — R52 Pain, unspecified: Secondary | ICD-10-CM | POA: Diagnosis not present

## 2015-04-27 DIAGNOSIS — G894 Chronic pain syndrome: Secondary | ICD-10-CM | POA: Diagnosis not present

## 2015-04-27 DIAGNOSIS — M792 Neuralgia and neuritis, unspecified: Secondary | ICD-10-CM | POA: Diagnosis not present

## 2015-04-27 DIAGNOSIS — F4542 Pain disorder with related psychological factors: Secondary | ICD-10-CM | POA: Diagnosis not present

## 2015-04-27 DIAGNOSIS — Z79891 Long term (current) use of opiate analgesic: Secondary | ICD-10-CM | POA: Diagnosis not present

## 2015-04-27 DIAGNOSIS — Z79899 Other long term (current) drug therapy: Secondary | ICD-10-CM | POA: Diagnosis not present

## 2015-04-28 DIAGNOSIS — M545 Low back pain: Secondary | ICD-10-CM | POA: Diagnosis not present

## 2015-04-28 DIAGNOSIS — R0789 Other chest pain: Secondary | ICD-10-CM | POA: Diagnosis not present

## 2015-04-28 DIAGNOSIS — Z79891 Long term (current) use of opiate analgesic: Secondary | ICD-10-CM | POA: Diagnosis not present

## 2015-04-28 DIAGNOSIS — Z79899 Other long term (current) drug therapy: Secondary | ICD-10-CM | POA: Diagnosis not present

## 2015-04-28 DIAGNOSIS — M25579 Pain in unspecified ankle and joints of unspecified foot: Secondary | ICD-10-CM | POA: Diagnosis not present

## 2015-04-28 DIAGNOSIS — G894 Chronic pain syndrome: Secondary | ICD-10-CM | POA: Diagnosis not present

## 2015-05-15 DIAGNOSIS — J0141 Acute recurrent pansinusitis: Secondary | ICD-10-CM | POA: Diagnosis not present

## 2015-05-15 DIAGNOSIS — J302 Other seasonal allergic rhinitis: Secondary | ICD-10-CM | POA: Diagnosis not present

## 2015-07-08 ENCOUNTER — Encounter: Payer: Self-pay | Admitting: General Practice

## 2015-07-21 ENCOUNTER — Telehealth: Payer: Self-pay | Admitting: General Practice

## 2015-07-21 NOTE — Telephone Encounter (Signed)
Pt was advised and understood.

## 2015-07-21 NOTE — Telephone Encounter (Signed)
Pt has not been seen yet by Dr Birdie Riddle and asking if he could get a TB test done. Pt states that he is do one.

## 2015-07-21 NOTE — Telephone Encounter (Signed)
He would have to go to urgent care or previous PCP.

## 2015-07-23 DIAGNOSIS — M5136 Other intervertebral disc degeneration, lumbar region: Secondary | ICD-10-CM | POA: Diagnosis not present

## 2015-07-23 DIAGNOSIS — G894 Chronic pain syndrome: Secondary | ICD-10-CM | POA: Diagnosis not present

## 2015-07-23 DIAGNOSIS — M4696 Unspecified inflammatory spondylopathy, lumbar region: Secondary | ICD-10-CM | POA: Diagnosis not present

## 2015-07-23 DIAGNOSIS — Z79899 Other long term (current) drug therapy: Secondary | ICD-10-CM | POA: Diagnosis not present

## 2015-07-23 DIAGNOSIS — Z79891 Long term (current) use of opiate analgesic: Secondary | ICD-10-CM | POA: Diagnosis not present

## 2015-07-23 DIAGNOSIS — M179 Osteoarthritis of knee, unspecified: Secondary | ICD-10-CM | POA: Diagnosis not present

## 2015-08-11 DIAGNOSIS — M5136 Other intervertebral disc degeneration, lumbar region: Secondary | ICD-10-CM | POA: Diagnosis not present

## 2015-08-11 DIAGNOSIS — Z79899 Other long term (current) drug therapy: Secondary | ICD-10-CM | POA: Diagnosis not present

## 2015-08-11 DIAGNOSIS — M4696 Unspecified inflammatory spondylopathy, lumbar region: Secondary | ICD-10-CM | POA: Diagnosis not present

## 2015-08-11 DIAGNOSIS — Z79891 Long term (current) use of opiate analgesic: Secondary | ICD-10-CM | POA: Diagnosis not present

## 2015-08-11 DIAGNOSIS — G894 Chronic pain syndrome: Secondary | ICD-10-CM | POA: Diagnosis not present

## 2015-09-13 DIAGNOSIS — Z79899 Other long term (current) drug therapy: Secondary | ICD-10-CM | POA: Diagnosis not present

## 2015-09-13 DIAGNOSIS — M4696 Unspecified inflammatory spondylopathy, lumbar region: Secondary | ICD-10-CM | POA: Diagnosis not present

## 2015-09-13 DIAGNOSIS — Z79891 Long term (current) use of opiate analgesic: Secondary | ICD-10-CM | POA: Diagnosis not present

## 2015-09-13 DIAGNOSIS — G894 Chronic pain syndrome: Secondary | ICD-10-CM | POA: Diagnosis not present

## 2015-09-13 DIAGNOSIS — M5136 Other intervertebral disc degeneration, lumbar region: Secondary | ICD-10-CM | POA: Diagnosis not present

## 2015-09-13 DIAGNOSIS — M79606 Pain in leg, unspecified: Secondary | ICD-10-CM | POA: Diagnosis not present

## 2015-09-22 ENCOUNTER — Encounter: Payer: Self-pay | Admitting: Emergency Medicine

## 2015-09-24 ENCOUNTER — Ambulatory Visit (INDEPENDENT_AMBULATORY_CARE_PROVIDER_SITE_OTHER): Payer: BLUE CROSS/BLUE SHIELD | Admitting: Family Medicine

## 2015-09-24 ENCOUNTER — Encounter: Payer: Self-pay | Admitting: Family Medicine

## 2015-09-24 VITALS — BP 134/80 | HR 80 | Temp 98.0°F | Resp 16 | Ht 67.0 in | Wt 359.1 lb

## 2015-09-24 DIAGNOSIS — E119 Type 2 diabetes mellitus without complications: Secondary | ICD-10-CM

## 2015-09-24 DIAGNOSIS — R7989 Other specified abnormal findings of blood chemistry: Secondary | ICD-10-CM | POA: Insufficient documentation

## 2015-09-24 DIAGNOSIS — F329 Major depressive disorder, single episode, unspecified: Secondary | ICD-10-CM

## 2015-09-24 DIAGNOSIS — E291 Testicular hypofunction: Secondary | ICD-10-CM

## 2015-09-24 DIAGNOSIS — Z23 Encounter for immunization: Secondary | ICD-10-CM | POA: Diagnosis not present

## 2015-09-24 DIAGNOSIS — N400 Enlarged prostate without lower urinary tract symptoms: Secondary | ICD-10-CM | POA: Insufficient documentation

## 2015-09-24 DIAGNOSIS — F32A Depression, unspecified: Secondary | ICD-10-CM | POA: Insufficient documentation

## 2015-09-24 DIAGNOSIS — M2550 Pain in unspecified joint: Secondary | ICD-10-CM

## 2015-09-24 DIAGNOSIS — G8929 Other chronic pain: Secondary | ICD-10-CM

## 2015-09-24 DIAGNOSIS — Z6841 Body Mass Index (BMI) 40.0 and over, adult: Secondary | ICD-10-CM

## 2015-09-24 MED ORDER — TAMSULOSIN HCL 0.4 MG PO CAPS: 0.4000 mg | ORAL_CAPSULE | Freq: Every day | ORAL | 6 refills | Status: DC

## 2015-09-24 MED ORDER — ATORVASTATIN CALCIUM 10 MG PO TABS: 10.0000 mg | ORAL_TABLET | Freq: Every day | ORAL | 6 refills | Status: DC

## 2015-09-24 MED ORDER — FLUOXETINE HCL 20 MG PO TABS: 20.0000 mg | ORAL_TABLET | Freq: Every day | ORAL | 3 refills | Status: DC

## 2015-09-24 NOTE — Progress Notes (Signed)
Pre visit review using our clinic review tool, if applicable. No additional management support is needed unless otherwise documented below in the visit note. 

## 2015-09-24 NOTE — Patient Instructions (Signed)
Follow up in 4-6 weeks to recheck depression/anxiety We'll notify you of your lab results and make any changes if needed Please schedule your eye exam at [facility] Please work on healthy diet and regular exercise- you can do it! Start the Prozac daily for depression Call with any questions or concerns Welcome!  We're glad to have you!!!

## 2015-09-24 NOTE — Progress Notes (Signed)
   Subjective:    Patient ID: Lenox Ahr, male    DOB: 10/31/1965, 50 y.o.   MRN: [id number]  HPI New to establish.  Previous MD- [facility]  [facility]- [place] Vira Blanco)  DM- chronic problem, pt on Januvia, Glipizide.  On ACE for renal protection.  UTD on foot exam- sees Dr Raelyn Ensign.  Due for eye exam- Gershon Crane.  Pt admits to not taking care of himself, 'my A1C sucks'.  Low T- pt was previously on androgel and creams but 'I don't like the creams'.  Has been on injxns previously.  Asking to restart injxns.  Morbid obesity- BMI now 56.25.  Pt is on Lipitor as preventative measure due to family hx of CAD.    BPH- chronic problem, on Flomax to improve his urinary sxs  Depression- chronic problem, previously on Wellbutrin but he doesn't feel this was effective.  Currently going to therapy but 'still feels like my life is falling apart'.  Pt reports an 'interesting childhood'.  Pt was on other medications in the past but can't recall which ones.    OSA- chronic problem, using CPAP 'religiously'.     Review of Systems For ROS see HPI     Objective:   Physical Exam  Constitutional: He is oriented to person, place, and time. He appears well-developed and well-nourished. No distress.  obese  HENT:  Head: Normocephalic and atraumatic.  Eyes: Conjunctivae and EOM are normal. Pupils are equal, round, and reactive to light.  Neck: Normal range of motion. Neck supple. No thyromegaly present.  Cardiovascular: Normal rate, regular rhythm, normal heart sounds and intact distal pulses.   No murmur heard. Pulmonary/Chest: Effort normal and breath sounds normal. No respiratory distress.  Abdominal: Soft. Bowel sounds are normal. He exhibits no distension.  Musculoskeletal: He exhibits no edema.  Lymphadenopathy:    He has no cervical adenopathy.  Neurological: He is alert and oriented to person, place, and time. No cranial nerve deficit.  Skin: Skin is warm and  dry.  Psychiatric: He has a normal mood and affect. His behavior is normal.  Vitals reviewed.         Assessment & Plan:

## 2015-10-04 ENCOUNTER — Encounter: Payer: Self-pay | Admitting: Family Medicine

## 2015-10-05 MED ORDER — ESZOPICLONE 2 MG PO TABS: 2.0000 mg | ORAL_TABLET | Freq: Every evening | ORAL | 3 refills | Status: DC | PRN

## 2015-10-13 ENCOUNTER — Telehealth: Payer: Self-pay | Admitting: Emergency Medicine

## 2015-10-13 NOTE — Telephone Encounter (Signed)
-----   Message from [name], MD sent at 09/29/2015  7:49 AM EDT ----- Please have pt return for her CPE labs  Thanks!! KT ----- Message ----- From: SYSTEM Sent: 09/29/2015  12:05 AM To: [name], MD

## 2015-10-13 NOTE — Telephone Encounter (Signed)
Left message for patient advising needing patient to stop by the lab to have orders drawn from 09/24/15 visit per Dr. Birdie Riddle. Patient does have issues with getting blood drawn. Did advise he could go to the [place] lab.

## 2015-10-15 NOTE — Assessment & Plan Note (Signed)
New to provider, ongoing for pt.  BMI is 56.25  Pt admits to not taking care of himself.  Stressed need for healthy diet and regular exercise.  Check labs to risk stratify.  Will follow closely.

## 2015-10-15 NOTE — Assessment & Plan Note (Signed)
New to provider, ongoing for pt.  He admits to not taking care of himself.  UTD on foot exam w/ podiatry.  Due for eye exam- encouraged him to schedule.  On ACE for renal protection.  Stressed need for low carb diet and regular exercise.  Check labs.  Adjust meds prn

## 2015-10-15 NOTE — Assessment & Plan Note (Signed)
New to provider, ongoing for pt.  sxs are not well controlled on current wellbutrin.  Add Prozac and monitor closely for improvement.  Encouraged him to continue therapy.  Will follow.

## 2015-10-15 NOTE — Assessment & Plan Note (Signed)
New to provider, ongoing for pt.  Does not like the testosterone creams and is asking to restart injxns.  Will need to get labs to determine if testosterone is even needed at this time.  Will discuss tx options if and when appropriate.  Pt expressed understanding and is in agreement w/ plan.

## 2015-10-15 NOTE — Assessment & Plan Note (Signed)
New to provider, ongoing for pt.  On Flomax to improve sxs

## 2015-10-15 NOTE — Assessment & Plan Note (Signed)
Chronic problem.  Seeing Dr Vira Blanco at [facility].  Will follow along

## 2015-10-22 ENCOUNTER — Other Ambulatory Visit (INDEPENDENT_AMBULATORY_CARE_PROVIDER_SITE_OTHER): Payer: BLUE CROSS/BLUE SHIELD

## 2015-10-22 DIAGNOSIS — E119 Type 2 diabetes mellitus without complications: Secondary | ICD-10-CM

## 2015-10-22 DIAGNOSIS — Z6841 Body Mass Index (BMI) 40.0 and over, adult: Secondary | ICD-10-CM | POA: Diagnosis not present

## 2015-10-22 DIAGNOSIS — N400 Enlarged prostate without lower urinary tract symptoms: Secondary | ICD-10-CM | POA: Diagnosis not present

## 2015-10-22 LAB — HEPATIC FUNCTION PANEL
ALT: 23 U/L (ref 9–46)
AST: 22 U/L (ref 10–40)
Albumin: 4 g/dL (ref 3.6–5.1)
Alkaline Phosphatase: 42 U/L (ref 40–115)
BILIRUBIN INDIRECT: 0.5 mg/dL (ref 0.2–1.2)
Bilirubin, Direct: 0.1 mg/dL (ref <=0.2)
TOTAL PROTEIN: 6.7 g/dL (ref 6.1–8.1)
Total Bilirubin: 0.6 mg/dL (ref 0.2–1.2)

## 2015-10-22 LAB — BASIC METABOLIC PANEL
BUN: 15 mg/dL (ref 7–25)
CALCIUM: 9.2 mg/dL (ref 8.6–10.3)
CO2: 23 mmol/L (ref 20–31)
Chloride: 102 mmol/L (ref 98–110)
Creat: 0.91 mg/dL (ref 0.60–1.35)
GLUCOSE: 107 mg/dL — AB (ref 65–99)
POTASSIUM: 4.1 mmol/L (ref 3.5–5.3)
Sodium: 137 mmol/L (ref 135–146)

## 2015-10-22 LAB — PSA: PSA: 0.1 ng/mL (ref <=4.0)

## 2015-10-22 LAB — LIPID PANEL
CHOL/HDL RATIO: 2.7 ratio (ref <=5.0)
Cholesterol: 112 mg/dL — ABNORMAL LOW (ref 125–200)
HDL: 42 mg/dL (ref >=40)
LDL Cholesterol: 49 mg/dL (ref <130)
TRIGLYCERIDES: 107 mg/dL (ref <150)
VLDL: 21 mg/dL (ref <30)

## 2015-10-22 LAB — CBC WITH DIFFERENTIAL/PLATELET
BASOS PCT: 0 %
Basophils Absolute: 0 cells/uL (ref 0–200)
Eosinophils Absolute: 104 cells/uL (ref 15–500)
Eosinophils Relative: 2 %
HCT: 42.7 % (ref 38.5–50.0)
Hemoglobin: 14.6 g/dL (ref 13.2–17.1)
LYMPHS PCT: 21 %
Lymphs Abs: 1092 cells/uL (ref 850–3900)
MCH: 31.6 pg (ref 27.0–33.0)
MCHC: 34.2 g/dL (ref 32.0–36.0)
MCV: 92.4 fL (ref 80.0–100.0)
MONOS PCT: 13 %
MPV: 8.9 fL (ref 7.5–12.5)
Monocytes Absolute: 676 cells/uL (ref 200–950)
NEUTROS ABS: 3328 {cells}/uL (ref 1500–7800)
Neutrophils Relative %: 64 %
PLATELETS: 209 10*3/uL (ref 140–400)
RBC: 4.62 MIL/uL (ref 4.20–5.80)
RDW: 13.1 % (ref 11.0–15.0)
WBC: 5.2 10*3/uL (ref 3.8–10.8)

## 2015-10-22 LAB — HEMOGLOBIN A1C
Hgb A1c MFr Bld: 6.8 % — ABNORMAL HIGH (ref <5.7)
Mean Plasma Glucose: 148 mg/dL

## 2015-10-22 LAB — TSH: TSH: 2.3 mIU/L (ref 0.40–4.50)

## 2015-10-25 LAB — TESTOSTERONE TOTAL,FREE,BIO, MALES
Albumin: 4 g/dL (ref 3.6–5.1)
Sex Hormone Binding: 44 nmol/L (ref 10–50)
Testosterone: 175 ng/dL — ABNORMAL LOW (ref 250–827)

## 2015-10-29 ENCOUNTER — Telehealth: Payer: Self-pay | Admitting: Family Medicine

## 2015-10-29 ENCOUNTER — Encounter: Payer: Self-pay | Admitting: Family Medicine

## 2015-10-29 MED ORDER — TESTOSTERONE ENANTHATE 200 MG/ML IM SOLN: 200.0000 mg | INTRAMUSCULAR | 1 refills | Status: DC

## 2015-10-29 NOTE — Telephone Encounter (Signed)
[facility] pharmacy initiated prior authorization for Testosterone Enanthate. Key: [id number] [place] approved effective 10/29/15-01/15/38 Notified patient pharmacy and patient

## 2015-11-05 ENCOUNTER — Ambulatory Visit: Payer: BLUE CROSS/BLUE SHIELD | Admitting: Family Medicine

## 2015-11-08 ENCOUNTER — Ambulatory Visit: Payer: BLUE CROSS/BLUE SHIELD | Admitting: Family Medicine

## 2015-11-10 ENCOUNTER — Encounter: Payer: Self-pay | Admitting: Family Medicine

## 2015-11-10 ENCOUNTER — Encounter: Payer: Self-pay | Admitting: General Practice

## 2015-11-10 ENCOUNTER — Other Ambulatory Visit: Payer: Self-pay | Admitting: General Practice

## 2015-11-10 MED ORDER — FLUOXETINE HCL 20 MG PO TABS: 20.0000 mg | ORAL_TABLET | Freq: Every day | ORAL | 1 refills | Status: DC

## 2015-11-12 ENCOUNTER — Ambulatory Visit (INDEPENDENT_AMBULATORY_CARE_PROVIDER_SITE_OTHER): Payer: BLUE CROSS/BLUE SHIELD | Admitting: Family Medicine

## 2015-11-12 ENCOUNTER — Encounter: Payer: Self-pay | Admitting: Family Medicine

## 2015-11-12 VITALS — BP 128/82 | HR 77 | Temp 98.4°F | Resp 17 | Ht 67.0 in | Wt 355.4 lb

## 2015-11-12 DIAGNOSIS — F331 Major depressive disorder, recurrent, moderate: Secondary | ICD-10-CM

## 2015-11-12 MED ORDER — FLUOXETINE HCL 40 MG PO CAPS: 40.0000 mg | ORAL_CAPSULE | Freq: Every day | ORAL | 3 refills | Status: DC

## 2015-11-12 NOTE — Progress Notes (Signed)
   Subjective:    Patient ID: Caleb White, male    DOB: Jun 03, 1965, 50 y.o.   MRN: [id number]  HPI Depression/anxiety- pt was switched from Wellbutrin to Prozac at last visit.  Pt doesn't note much difference in mood but notes improved endurance during sex.  Pt doesn't feel down as often as previously but when he is down, the mood is similar to when he was on the Wellbutrin.  Pt has lost 4 lbs since last visit.   Review of Systems For ROS see HPI     Objective:   Physical Exam  Constitutional: He is oriented to person, place, and time. He appears well-developed and well-nourished. No distress.  obese  Neurological: He is alert and oriented to person, place, and time.  Skin: Skin is warm and dry.  Psychiatric: He has a normal mood and affect. His behavior is normal. Thought content normal.  Vitals reviewed.         Assessment & Plan:

## 2015-11-12 NOTE — Progress Notes (Signed)
Pre visit review using our clinic review tool, if applicable. No additional management support is needed unless otherwise documented below in the visit note. 

## 2015-11-12 NOTE — Patient Instructions (Signed)
Follow up in 3 months to recheck diabetes Follow up by phone or MyChart in 3-4 weeks to let me know how you are doing Increase the Prozac to 40mg - 2 of what you have and 1 of the new prescription Call and schedule an appt with your therapist and your eye exam Call with any questions or concerns Happy Halloween!!!

## 2015-11-12 NOTE — Assessment & Plan Note (Signed)
Ongoing issue for pt.  # of depressed days are improving.  Continues to have down mood that is similar to previous.  Will increase the Prozac to 40mg  daily and follow closely.  Pt expressed understanding and is in agreement w/ plan.

## 2015-12-27 ENCOUNTER — Encounter: Payer: Self-pay | Admitting: Family Medicine

## 2015-12-27 ENCOUNTER — Ambulatory Visit (INDEPENDENT_AMBULATORY_CARE_PROVIDER_SITE_OTHER): Payer: BLUE CROSS/BLUE SHIELD | Admitting: Family Medicine

## 2015-12-27 ENCOUNTER — Encounter: Payer: Self-pay | Admitting: General Practice

## 2015-12-27 VITALS — BP 130/78 | HR 71 | Temp 98.0°F | Resp 16 | Ht 67.0 in | Wt 354.2 lb

## 2015-12-27 DIAGNOSIS — M75102 Unspecified rotator cuff tear or rupture of left shoulder, not specified as traumatic: Secondary | ICD-10-CM | POA: Diagnosis not present

## 2015-12-27 DIAGNOSIS — M79622 Pain in left upper arm: Secondary | ICD-10-CM | POA: Diagnosis not present

## 2015-12-27 DIAGNOSIS — G4733 Obstructive sleep apnea (adult) (pediatric): Secondary | ICD-10-CM | POA: Diagnosis not present

## 2015-12-27 DIAGNOSIS — M2550 Pain in unspecified joint: Secondary | ICD-10-CM | POA: Diagnosis not present

## 2015-12-27 DIAGNOSIS — M79602 Pain in left arm: Secondary | ICD-10-CM | POA: Diagnosis not present

## 2015-12-27 DIAGNOSIS — G8929 Other chronic pain: Secondary | ICD-10-CM

## 2015-12-27 DIAGNOSIS — M25512 Pain in left shoulder: Secondary | ICD-10-CM | POA: Diagnosis not present

## 2015-12-27 MED ORDER — HYDROCODONE-ACETAMINOPHEN 5-325 MG PO TABS: 1.0000 | ORAL_TABLET | Freq: Four times a day (QID) | ORAL | 0 refills | Status: DC | PRN

## 2015-12-27 MED ORDER — MELOXICAM 15 MG PO TABS
15.0000 mg | ORAL_TABLET | Freq: Every day | ORAL | 0 refills | Status: DC
Start: 2015-12-27 — End: 2016-01-22

## 2015-12-27 NOTE — Progress Notes (Signed)
   Subjective:    Patient ID: Caleb White, male    DOB: November 02, 1965, 50 y.o.   MRN: [id number]  HPI L arm pain- was reaching out on Saturday and felt a 'pop' w/ sharp pain.  Works as a Copy.  Used ice and left over muscle relaxers w/ some improvement.  The initial sharp pain has improved but he has severely limited ROM- particularly w/ overhead motion.  Pain radiates down from shoulder into forearm.  OSA- chronic problem, using CPAP nightly but only sleeping 3-4 hrs/night.  Asking for a repeat sleep study.  Chronic pain- ongoing issue for pt.  Will alternate between 2-6 hydrocodone daily based on level of pain.  Having difficulty w/ billing at the Pain Clinic ([facility] in [place]), 'i'm kinda done w/ them'.  Getting 120 pills/month.  Pain is 'mostly back' but also in knees and ankles.   Review of Systems For ROS see HPI     Objective:   Physical Exam  Constitutional: He is oriented to person, place, and time. He appears well-developed and well-nourished. No distress.  Morbidly obese  HENT:  Head: Normocephalic and atraumatic.  Cardiovascular: Intact distal pulses.   Musculoskeletal: He exhibits tenderness (TTP over coracoid process and head of bicep on L). He exhibits no edema or deformity.  Pain w/ abduction >80 degrees, forward flexion >90, no pain w/ elbow flexion/extension  Neurological: He is alert and oriented to person, place, and time. He has normal reflexes. No cranial nerve deficit. Coordination normal.  Skin: Skin is warm and dry. No erythema.  No bruising seen  Psychiatric: He has a normal mood and affect. His behavior is normal. Thought content normal.  Vitals reviewed.         Assessment & Plan:

## 2015-12-27 NOTE — Patient Instructions (Signed)
Follow up as scheduled We'll notify you of your pulmonary and ortho appts Start the Meloxicam once daily- take w/ food Continue the hydrocodone as needed for pain Call with any questions or concerns Hang in there!!! Happy Holidays!!!

## 2015-12-27 NOTE — Assessment & Plan Note (Signed)
Chronic problem.  Pt still not sleeping well despite CPAP.  Refer to pulmonary for repeat sleep study and adjustment of pressures.  Pt expressed understanding and is in agreement w/ plan.

## 2015-12-27 NOTE — Progress Notes (Signed)
Pre visit review using our clinic review tool, if applicable. No additional management support is needed unless otherwise documented below in the visit note. 

## 2015-12-27 NOTE — Assessment & Plan Note (Signed)
New.  Pt felt and heard a 'pop' 2 days ago and now w/ limited ROM.  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  Based on this, will start scheduled NSAIDs and refer to Ortho for complete evaluation and tx.  Pt expressed understanding and is in agreement w/ plan.

## 2015-12-27 NOTE — Assessment & Plan Note (Signed)
Ongoing issue.  Pt was unhappy w/ his provider at [facility].  Asking for me to assume his Hydrocodone prescribing.  Reviewed need for controlled substance agreement, UDS, and routine f/u appts.  Indication for chronic opioid: chronic back and joint pain Medication and dose: Hydrocodone 5/325mg  Q6 PRN # pills per month: 120 Last UDS date: Due at next appt Pain contract signed (Y/N): Y Date narcotic database last reviewed (include red flags): 12/27/15- no red flags

## 2016-01-03 DIAGNOSIS — M25512 Pain in left shoulder: Secondary | ICD-10-CM | POA: Diagnosis not present

## 2016-01-05 DIAGNOSIS — M25512 Pain in left shoulder: Secondary | ICD-10-CM | POA: Diagnosis not present

## 2016-01-12 DIAGNOSIS — M25512 Pain in left shoulder: Secondary | ICD-10-CM | POA: Diagnosis not present

## 2016-01-18 DIAGNOSIS — R110 Nausea: Secondary | ICD-10-CM | POA: Diagnosis not present

## 2016-01-18 DIAGNOSIS — R109 Unspecified abdominal pain: Secondary | ICD-10-CM | POA: Diagnosis not present

## 2016-01-18 DIAGNOSIS — R1084 Generalized abdominal pain: Secondary | ICD-10-CM | POA: Diagnosis not present

## 2016-01-22 ENCOUNTER — Other Ambulatory Visit: Payer: Self-pay | Admitting: Family Medicine

## 2016-02-07 ENCOUNTER — Ambulatory Visit: Payer: Self-pay | Admitting: Family Medicine

## 2016-02-07 ENCOUNTER — Ambulatory Visit (INDEPENDENT_AMBULATORY_CARE_PROVIDER_SITE_OTHER): Payer: BLUE CROSS/BLUE SHIELD | Admitting: Family Medicine

## 2016-02-07 ENCOUNTER — Encounter: Payer: Self-pay | Admitting: Family Medicine

## 2016-02-07 VITALS — BP 126/86 | HR 86 | Temp 98.1°F | Resp 16 | Ht 67.0 in | Wt 350.1 lb

## 2016-02-07 DIAGNOSIS — G8929 Other chronic pain: Secondary | ICD-10-CM

## 2016-02-07 DIAGNOSIS — M2550 Pain in unspecified joint: Secondary | ICD-10-CM

## 2016-02-07 DIAGNOSIS — E119 Type 2 diabetes mellitus without complications: Secondary | ICD-10-CM

## 2016-02-07 LAB — BASIC METABOLIC PANEL
BUN: 18 mg/dL (ref 6–23)
CHLORIDE: 102 meq/L (ref 96–112)
CO2: 24 meq/L (ref 19–32)
CREATININE: 0.83 mg/dL (ref 0.40–1.50)
Calcium: 9.6 mg/dL (ref 8.4–10.5)
GFR: 104.72 mL/min (ref >60.00)
Glucose, Bld: 246 mg/dL — ABNORMAL HIGH (ref 70–99)
Potassium: 4.4 mEq/L (ref 3.5–5.1)
Sodium: 135 mEq/L (ref 135–145)

## 2016-02-07 LAB — HEMOGLOBIN A1C: HEMOGLOBIN A1C: 7.7 % — AB (ref 4.6–6.5)

## 2016-02-07 MED ORDER — ESZOPICLONE 2 MG PO TABS: 2.0000 mg | ORAL_TABLET | Freq: Every evening | ORAL | 3 refills | Status: DC | PRN

## 2016-02-07 MED ORDER — HYDROCODONE-ACETAMINOPHEN 5-325 MG PO TABS: 1.0000 | ORAL_TABLET | Freq: Four times a day (QID) | ORAL | 0 refills | Status: DC | PRN

## 2016-02-07 MED ORDER — NITROGLYCERIN 0.4 MG SL SUBL: 0.4000 mg | SUBLINGUAL_TABLET | SUBLINGUAL | 3 refills | Status: DC | PRN

## 2016-02-07 NOTE — Assessment & Plan Note (Signed)
Due for med refill today.  Will get UDS

## 2016-02-07 NOTE — Progress Notes (Signed)
Pre visit review using our clinic review tool, if applicable. No additional management support is needed unless otherwise documented below in the visit note. 

## 2016-02-07 NOTE — Progress Notes (Signed)
   Subjective:    Patient ID: Caleb White, male    DOB: 01/14/1966, 51 y.o.   MRN: [id number]  HPI DM- chronic problem, currently on Glipizide and Januvia.  Pt is down 4 lbs from last visit.  Due for eye exam- pt admits to putting this off.  No numbness/tingling of hands or feet.  Rare symptomatic lows.  No CP, SOB, HAs, visual changes.  No sores or open wounds.  Pt was previously on Alogliptin 25mg  but he has not refilled this recently.   Review of Systems For ROS see HPI     Objective:   Physical Exam  Constitutional: He is oriented to person, place, and time. He appears well-developed and well-nourished. No distress.  obese  HENT:  Head: Normocephalic and atraumatic.  Eyes: Conjunctivae and EOM are normal. Pupils are equal, round, and reactive to light.  Neck: Normal range of motion. Neck supple. No thyromegaly present.  Cardiovascular: Normal rate, regular rhythm, normal heart sounds and intact distal pulses.   No murmur heard. Pulmonary/Chest: Effort normal and breath sounds normal. No respiratory distress.  Abdominal: Soft. Bowel sounds are normal. He exhibits no distension.  Musculoskeletal: He exhibits no edema.  Lymphadenopathy:    He has no cervical adenopathy.  Neurological: He is alert and oriented to person, place, and time. No cranial nerve deficit.  Skin: Skin is warm and dry.  Psychiatric: He has a normal mood and affect. His behavior is normal.  Vitals reviewed.         Assessment & Plan:

## 2016-02-07 NOTE — Patient Instructions (Addendum)
Schedule your complete physical in 3-4 months We'll notify you of your lab results and make any changes if needed Continue to work on healthy diet and regular exercise- you can do it! Please call and schedule your eye exam!!! Carry the nitro and only use as needed Call with any questions or concerns Happy New Year!

## 2016-02-07 NOTE — Assessment & Plan Note (Signed)
Chronic problem.  On Glipizide and Januvia.  Asymptomatic at this time.  Due for eye exam- pt states he will schedule.  On ACE for renal protection.  Foot exam done today.  Stressed need for healthy diet and regular exercise.  Check labs.  Adjust meds prn

## 2016-02-08 ENCOUNTER — Encounter: Payer: Self-pay | Admitting: Family Medicine

## 2016-02-08 DIAGNOSIS — Z79891 Long term (current) use of opiate analgesic: Secondary | ICD-10-CM | POA: Diagnosis not present

## 2016-02-09 ENCOUNTER — Encounter: Payer: Self-pay | Admitting: Family Medicine

## 2016-02-15 ENCOUNTER — Ambulatory Visit: Payer: BLUE CROSS/BLUE SHIELD | Admitting: *Deleted

## 2016-02-15 DIAGNOSIS — Z23 Encounter for immunization: Secondary | ICD-10-CM

## 2016-02-15 MED ORDER — TETANUS-DIPHTH-ACELL PERTUSSIS 5-2.5-18.5 LF-MCG/0.5 IM SUSP
0.5000 mL | Freq: Once | INTRAMUSCULAR | Status: AC
  Administered 2016-02-15: 0.5 mL via INTRAMUSCULAR

## 2016-02-16 ENCOUNTER — Other Ambulatory Visit: Payer: Self-pay | Admitting: Family Medicine

## 2016-02-21 ENCOUNTER — Institutional Professional Consult (permissible substitution): Payer: BLUE CROSS/BLUE SHIELD | Admitting: Pulmonary Disease

## 2016-03-03 ENCOUNTER — Other Ambulatory Visit: Payer: Self-pay | Admitting: Family Medicine

## 2016-03-03 ENCOUNTER — Encounter: Payer: Self-pay | Admitting: Family Medicine

## 2016-03-03 DIAGNOSIS — M2550 Pain in unspecified joint: Principal | ICD-10-CM

## 2016-03-03 DIAGNOSIS — Z0184 Encounter for antibody response examination: Secondary | ICD-10-CM

## 2016-03-03 DIAGNOSIS — G8929 Other chronic pain: Secondary | ICD-10-CM

## 2016-03-03 MED ORDER — HYDROCODONE-ACETAMINOPHEN 5-325 MG PO TABS: 1.0000 | ORAL_TABLET | Freq: Four times a day (QID) | ORAL | 0 refills | Status: DC | PRN

## 2016-03-03 NOTE — Telephone Encounter (Signed)
Last OV 02/07/16 Hydrocodone last filled 02/07/16 #120 with 0

## 2016-03-03 NOTE — Telephone Encounter (Signed)
Indication for chronic opioid: chronic joint pain Medication and dose: Hydrocodone 5/325mg  q6 prn # pills per month: #120 Last UDS date: 02/16/16 Pain contract signed (Y/N): Y Date narcotic database last reviewed (include red flags): reviewed 03/03/16  Pt is requesting this too early.  He is not due for refill until 2/22.  He can pick this up sometime next week.  Also, his prescription is written every 6 hours as needed but he appears to be taking this as a scheduled medication.  Please ask him how he's using this.

## 2016-03-04 ENCOUNTER — Other Ambulatory Visit: Payer: Self-pay | Admitting: Family Medicine

## 2016-03-07 DIAGNOSIS — R110 Nausea: Secondary | ICD-10-CM | POA: Diagnosis not present

## 2016-03-07 DIAGNOSIS — R52 Pain, unspecified: Secondary | ICD-10-CM | POA: Diagnosis not present

## 2016-03-24 ENCOUNTER — Other Ambulatory Visit (INDEPENDENT_AMBULATORY_CARE_PROVIDER_SITE_OTHER): Payer: BLUE CROSS/BLUE SHIELD

## 2016-03-24 DIAGNOSIS — Z0184 Encounter for antibody response examination: Secondary | ICD-10-CM

## 2016-03-25 LAB — QUANTIFERON TB GOLD ASSAY (BLOOD)
Interferon Gamma Release Assay: NEGATIVE
MITOGEN-NIL SO: 9.28 [IU]/mL
QUANTIFERON TB AG MINUS NIL: 0.01 [IU]/mL
Quantiferon Nil Value: 0.03 IU/mL

## 2016-04-04 ENCOUNTER — Encounter: Payer: Self-pay | Admitting: Physician Assistant

## 2016-04-04 ENCOUNTER — Ambulatory Visit (INDEPENDENT_AMBULATORY_CARE_PROVIDER_SITE_OTHER): Payer: BLUE CROSS/BLUE SHIELD | Admitting: Physician Assistant

## 2016-04-04 VITALS — BP 130/84 | HR 75 | Temp 98.6°F | Resp 16 | Ht 67.0 in | Wt 358.0 lb

## 2016-04-04 DIAGNOSIS — E11628 Type 2 diabetes mellitus with other skin complications: Secondary | ICD-10-CM | POA: Diagnosis not present

## 2016-04-04 DIAGNOSIS — L03032 Cellulitis of left toe: Secondary | ICD-10-CM | POA: Diagnosis not present

## 2016-04-04 MED ORDER — DOXYCYCLINE HYCLATE 100 MG PO CAPS: 100.0000 mg | ORAL_CAPSULE | Freq: Two times a day (BID) | ORAL | 0 refills | Status: DC

## 2016-04-04 NOTE — Progress Notes (Signed)
Pre visit review using our clinic review tool, if applicable. No additional management support is needed unless otherwise documented below in the visit note. 

## 2016-04-04 NOTE — Patient Instructions (Signed)
Please keep area clean and dry.  Continue care of the area as we discussed. Take antibiotic as directed. Follow-up on Friday for repeat assessment. If you note any worsening symptoms on antibiotic or any fever, please go to the ER.

## 2016-04-05 ENCOUNTER — Encounter: Payer: Self-pay | Admitting: Family Medicine

## 2016-04-05 DIAGNOSIS — M2550 Pain in unspecified joint: Principal | ICD-10-CM

## 2016-04-05 DIAGNOSIS — G8929 Other chronic pain: Secondary | ICD-10-CM

## 2016-04-05 MED ORDER — HYDROCODONE-ACETAMINOPHEN 5-325 MG PO TABS: 1.0000 | ORAL_TABLET | Freq: Four times a day (QID) | ORAL | 0 refills | Status: DC | PRN

## 2016-04-05 NOTE — Progress Notes (Signed)
Pt presents to clinic today c/o toe infection x 2 weeks. Pt states that area is located on L great toe around the toe nail on the lateral side. Reports localized swelling, erythema, and pus drainage. States drainage has been occurring approximately 1 week. Pt has been soaking in Epsom salt and using peroxide and Neosporin with relief. Denies any trauma/injury to the nail/foot. Pt is diabetic and states his wife "doesn't think he properly attends to his feet." Denies fevers. States he has had infections like this in the past, but typically resolve earlier than this has. Also admits hx of ingrown toenails.   Past Medical History:  Diagnosis Date  . Arthritis    per patient  . Back injury    per patient, history of ruptured or torn disk   . Depression   . Diabetes mellitus without complication ([place])   . Foot deformity   . Gluten intolerance    self diagnosed, no h/o biopsy or blood testing  . Hyperlipidemia   . Low testosterone   . Morbid obesity with BMI of 50.0-59.9, adult ([place])   . Shortness of breath   . Sleep apnea    on CPAP  . Spinal stenosis     Current Outpatient Prescriptions on File Prior to Visit  Medication Sig Dispense Refill  . Alogliptin Benzoate 25 MG TABS Take 1 tablet by mouth daily.    Marland Kitchen atorvastatin (LIPITOR) 10 MG tablet TAKE 1 TABLET DAILY 30 tablet 6  . Blood Glucose Monitoring Suppl (ONETOUCH VERIO IQ SYSTEM) w/Device KIT by Does not apply route.    . dicyclomine (BENTYL) 20 MG tablet Take 20 mg by mouth.    . eszopiclone (LUNESTA) 2 MG TABS tablet Take 1 tablet (2 mg total) by mouth at bedtime as needed for sleep. Take immediately before bedtime 30 tablet 3  . FLUoxetine (PROZAC) 40 MG capsule TAKE 1 CAPSULE (40 MG TOTAL) BY MOUTH DAILY. 30 capsule 3  . glipiZIDE (GLUCOTROL) 10 MG tablet TAKE 1 TABLET BY MOUTH EVERY 12 HOURS 180 tablet 3  . HYDROcodone-acetaminophen (NORCO/VICODIN) 5-325 MG tablet Take 1 tablet by mouth every 6 (six) hours as needed for  moderate pain. 120 tablet 0  . lisinopril (PRINIVIL,ZESTRIL) 2.5 MG tablet TAKE 1 TABLET BY MOUTH DAILY AS DIRECTED 90 tablet 3  . loratadine (CLARITIN) 10 MG tablet Take 10 mg by mouth daily.    . meloxicam (MOBIC) 15 MG tablet TAKE 1 TABLET (15 MG TOTAL) BY MOUTH DAILY. 30 tablet 2  . nitroGLYCERIN (NITROSTAT) 0.4 MG SL tablet Place 1 tablet (0.4 mg total) under the tongue every 5 (five) minutes as needed for chest pain. 30 tablet 3  . sitaGLIPtin (JANUVIA) 100 MG tablet Take 100 mg by mouth daily.    . tamsulosin (FLOMAX) 0.4 MG CAPS capsule TAKE 1 CAPSULE DAILY 30 capsule 6  . testosterone enanthate (DELATESTRYL) 200 MG/ML injection Inject 1 mL (200 mg total) into the muscle every 14 (fourteen) days. For IM use only 5 mL 1  . tiZANidine (ZANAFLEX) 4 MG tablet   5   No current facility-administered medications on file prior to visit.     Allergies  Allergen Reactions  . Penicillins Anaphylaxis  . Ambien [Zolpidem Tartrate] Other (See Comments)    nightmares  . Gluten Meal Other (See Comments)    Diarrhea and headache  . Latex     Family History  Problem Relation Age of Onset  . Heart attack Mother   . Heart failure  Mother   . Diabetes Mother   . Heart attack Father   . Hypertension Father   . Heart disease Brother   . Diabetes Brother   . Clotting disorder Paternal Aunt     Blood clot after minor surgery  . Clotting disorder Paternal Grandmother     Blood clot after minor surgery  . Clotting disorder Maternal Grandmother   . Cancer Maternal Grandmother   . Diabetes Maternal Grandfather   . Alcohol abuse Paternal Grandfather     Social History   Social History  . Marital status: Single    Spouse name: Anitra Lauth  . Number of children: 3  . Years of education: college   Occupational History  . Sign language interpretor    Social History Main Topics  . Smoking status: Former Smoker    Packs/day: 1.00    Years: 7.00    Types: Cigarettes    Quit date:  09/22/2012  . Smokeless tobacco: Never Used  . Alcohol use Yes     Comment: Rarely  . Drug use: No  . Sexual activity: Yes    Partners: Female   Other Topics Concern  . None   Social History Narrative  . None   Review of Systems - See HPI.  All other ROS are negative.  BP 130/84   Pulse 75   Temp 98.6 F (37 C) (Oral)   Resp 16   Ht 5' 7" (1.702 m)   Wt (!) 358 lb (162.4 kg)   SpO2 95%   BMI 56.07 kg/m   Physical Exam  Constitutional: He is oriented to person, place, and time and well-developed, well-nourished, and in no distress.  HENT:  Head: Normocephalic and atraumatic.  Eyes: Conjunctivae are normal.  Neck: Neck supple.  Cardiovascular: Normal rate, regular rhythm, normal heart sounds and intact distal pulses.   Pulmonary/Chest: Effort normal and breath sounds normal. No respiratory distress. He has no wheezes. He has no rales. He exhibits no tenderness.  Musculoskeletal:       Feet:  Neurological: He is alert and oriented to person, place, and time.  Skin: Skin is warm and dry.  Vitals reviewed.  Recent Results (from the past 2160 hour(s))  Hemoglobin A1c     Status: Abnormal   Collection Time: 02/07/16  1:45 PM  Result Value Ref Range   Hgb A1c MFr Bld 7.7 (H) 4.6 - 6.5 %    Comment: Glycemic Control Guidelines for People with Diabetes:Non Diabetic:  <6%Goal of Therapy: <7%Additional Action Suggested:  >0%   Basic metabolic panel     Status: Abnormal   Collection Time: 02/07/16  1:45 PM  Result Value Ref Range   Sodium 135 135 - 145 mEq/L   Potassium 4.4 3.5 - 5.1 mEq/L   Chloride 102 96 - 112 mEq/L   CO2 24 19 - 32 mEq/L   Glucose, Bld 246 (H) 70 - 99 mg/dL   BUN 18 6 - 23 mg/dL   Creatinine, Ser 0.83 0.40 - 1.50 mg/dL   Calcium 9.6 8.4 - 10.5 mg/dL   GFR 104.72 >60.00 mL/min  Quantiferon tb gold assay     Status: None   Collection Time: 03/24/16  2:10 PM  Result Value Ref Range   Interferon Gamma Release Assay NEGATIVE NEGATIVE    Comment:  Negative test result. M. tuberculosis complex infection unlikely.   Quantiferon Nil Value 0.03 IU/mL   Mitogen-Nil 9.28 IU/mL   Quantiferon Tb Ag Minus Nil Value 0.01 IU/mL  Comment:   The Nil tube value is used to determine if the patient has a preexisting immune response which could cause a false-positive reading on the test. In order for a test to be valid, the Nil tube must have a value of less than or equal to 8.0 IU/mL.   The mitogen control tube is used to assure the patient has a healthy immune status and also serves as a control for correct blood handling and incubation. It is used to detect false-negative readings. The mitogen tube must have a gamma interferon value of greater than or equal to 0.5 IU/mL higher than the value of the Nil tube.   The TB antigen tube is coated with the M. tuberculosis specific antigens. For a test to be considered positive, the TB antigen tube value minus the Nil tube value must be greater than or equal to 0.35 IU/mL.   For additional information, please refer to http://education.quest[facility].com/faq/QFT (This link is being provided for informational/educational purposes only.)     Assessment/Plan: 1. Paronychia of great toe of left foot Giving DM and not-improving over the past week with supportive measures and topical OTC antibiotics, will start oral antibiotics. Rx Doxycycline. Culture sent. Close F/U scheduled for Friday.  Alarm signs/symptoms that would prompt Er assessment reviewed with patient. Patient voices understanding and agreement with the plan.   - Wound culture   Leeanne Rio, PA-C

## 2016-04-05 NOTE — Telephone Encounter (Signed)
Last OV 02/07/16 Hydrocodone last filled 03/03/16 #120 with 0\   CSC, and UDS

## 2016-04-07 ENCOUNTER — Encounter: Payer: Self-pay | Admitting: Family Medicine

## 2016-04-07 ENCOUNTER — Ambulatory Visit (INDEPENDENT_AMBULATORY_CARE_PROVIDER_SITE_OTHER): Payer: BLUE CROSS/BLUE SHIELD | Admitting: Family Medicine

## 2016-04-07 VITALS — BP 126/86 | HR 81 | Temp 98.6°F | Resp 16 | Ht 67.0 in | Wt 358.5 lb

## 2016-04-07 DIAGNOSIS — F331 Major depressive disorder, recurrent, moderate: Secondary | ICD-10-CM | POA: Diagnosis not present

## 2016-04-07 DIAGNOSIS — L03032 Cellulitis of left toe: Secondary | ICD-10-CM | POA: Diagnosis not present

## 2016-04-07 LAB — WOUND CULTURE
Gram Stain: NONE SEEN
Gram Stain: NONE SEEN
Gram Stain: NONE SEEN
ORGANISM ID, BACTERIA: NORMAL

## 2016-04-07 MED ORDER — BUPROPION HCL ER (XL) 150 MG PO TB24: 150.0000 mg | ORAL_TABLET | Freq: Every day | ORAL | 3 refills | Status: DC

## 2016-04-07 MED ORDER — FLUOXETINE HCL 20 MG PO TABS: 20.0000 mg | ORAL_TABLET | Freq: Every day | ORAL | 3 refills | Status: DC

## 2016-04-07 NOTE — Assessment & Plan Note (Signed)
Chronic problem.  Mood has improved but pt is now having sexual side effects and this is unacceptable to him.  Decrease Prozac back to 20mg  daily and add Wellbutrin 150mg  daily.  Pt expressed understanding and is in agreement w/ plan.

## 2016-04-07 NOTE — Patient Instructions (Signed)
Follow up as scheduled- sooner if needed We'll call you with your podiatry appt Continue the Doxycycline twice daily- take w/ food Decrease the Fluoxetine to 20mg  daily (new prescription sent) and ADD Wellbutrin 150mg  daily Call with any questions or concerns Hang in there!!!

## 2016-04-07 NOTE — Progress Notes (Signed)
Pre visit review using our clinic review tool, if applicable. No additional management support is needed unless otherwise documented below in the visit note. 

## 2016-04-07 NOTE — Progress Notes (Signed)
   Subjective:    Patient ID: Lenox Ahr, male    DOB: May 24, 1965, 51 y.o.   MRN: [id number]  HPI paronychia- pt was seen 3/20 and started on Doxy.  Pt took meds on empty stomach this AM and was very sick this AM.    Depression- chronic problem.  Pt reports improved mood w/ Prozac 40mg  but 'total lack of libido' which is not acceptable to him.   Review of Systems For ROS see HPI     Objective:   Physical Exam  Constitutional: He is oriented to person, place, and time. He appears well-developed and well-nourished. No distress.  Morbidly obese  Cardiovascular: Intact distal pulses.   Neurological: He is alert and oriented to person, place, and time.  Skin: Skin is warm and dry. There is erythema (mild erythema surrounding lateral edge of L great toe nail w/ purulent drainage and mild TTP).  Psychiatric: He has a normal mood and affect. His behavior is normal. Thought content normal.  Vitals reviewed.         Assessment & Plan:

## 2016-04-07 NOTE — Assessment & Plan Note (Signed)
New to provider, ongoing for pt.  Area is less painful but still oozing.  Suspect ingrown nail edge.  Refer to podiatry for definitive treatment.  Pt expressed understanding and is in agreement w/ plan.

## 2016-04-13 DIAGNOSIS — R0683 Snoring: Secondary | ICD-10-CM | POA: Diagnosis not present

## 2016-04-13 DIAGNOSIS — Z9989 Dependence on other enabling machines and devices: Secondary | ICD-10-CM | POA: Diagnosis not present

## 2016-04-13 DIAGNOSIS — G4733 Obstructive sleep apnea (adult) (pediatric): Secondary | ICD-10-CM | POA: Diagnosis not present

## 2016-04-13 DIAGNOSIS — G4719 Other hypersomnia: Secondary | ICD-10-CM | POA: Diagnosis not present

## 2016-04-20 DIAGNOSIS — L600 Ingrowing nail: Secondary | ICD-10-CM | POA: Diagnosis not present

## 2016-04-22 ENCOUNTER — Other Ambulatory Visit: Payer: Self-pay | Admitting: Family Medicine

## 2016-04-24 NOTE — Telephone Encounter (Signed)
Medication filled to pharmacy as requested.   

## 2016-04-24 NOTE — Telephone Encounter (Signed)
Last OV 04/07/16 Testosterone last filled 10/29/15 73mL with 1 refill  Testosterone level last checked 10/22/15 (175)

## 2016-04-25 ENCOUNTER — Encounter: Payer: Self-pay | Admitting: Family Medicine

## 2016-04-30 ENCOUNTER — Other Ambulatory Visit: Payer: Self-pay | Admitting: Family Medicine

## 2016-05-09 ENCOUNTER — Other Ambulatory Visit: Payer: Self-pay | Admitting: Family Medicine

## 2016-05-09 DIAGNOSIS — G8929 Other chronic pain: Secondary | ICD-10-CM

## 2016-05-09 DIAGNOSIS — M2550 Pain in unspecified joint: Principal | ICD-10-CM

## 2016-05-09 NOTE — Telephone Encounter (Signed)
Last OV 04/07/16 Hydrocodone last filled 04/05/16 #120 with 0

## 2016-05-10 ENCOUNTER — Other Ambulatory Visit: Payer: Self-pay | Admitting: General Practice

## 2016-05-10 MED ORDER — MELOXICAM 15 MG PO TABS: 15.0000 mg | ORAL_TABLET | Freq: Every day | ORAL | 0 refills | Status: DC

## 2016-05-10 MED ORDER — BUPROPION HCL ER (XL) 150 MG PO TB24: 150.0000 mg | ORAL_TABLET | Freq: Every day | ORAL | 1 refills | Status: DC

## 2016-05-11 MED ORDER — HYDROCODONE-ACETAMINOPHEN 5-325 MG PO TABS: 1.0000 | ORAL_TABLET | Freq: Four times a day (QID) | ORAL | 0 refills | Status: DC | PRN

## 2016-05-11 NOTE — Telephone Encounter (Signed)
Reviewed database.  No red flags

## 2016-05-12 NOTE — Telephone Encounter (Signed)
Pt advised that Rx is available at the front desk for pick up.

## 2016-06-01 ENCOUNTER — Ambulatory Visit (INDEPENDENT_AMBULATORY_CARE_PROVIDER_SITE_OTHER): Payer: BLUE CROSS/BLUE SHIELD | Admitting: Family Medicine

## 2016-06-01 ENCOUNTER — Encounter: Payer: Self-pay | Admitting: Family Medicine

## 2016-06-01 VITALS — BP 121/81 | HR 77 | Temp 98.5°F | Resp 17 | Ht 67.0 in | Wt 362.2 lb

## 2016-06-01 DIAGNOSIS — Z0000 Encounter for general adult medical examination without abnormal findings: Secondary | ICD-10-CM

## 2016-06-01 DIAGNOSIS — N401 Enlarged prostate with lower urinary tract symptoms: Secondary | ICD-10-CM | POA: Diagnosis not present

## 2016-06-01 DIAGNOSIS — M2550 Pain in unspecified joint: Secondary | ICD-10-CM

## 2016-06-01 DIAGNOSIS — E119 Type 2 diabetes mellitus without complications: Secondary | ICD-10-CM

## 2016-06-01 DIAGNOSIS — R350 Frequency of micturition: Secondary | ICD-10-CM

## 2016-06-01 DIAGNOSIS — Z1211 Encounter for screening for malignant neoplasm of colon: Secondary | ICD-10-CM | POA: Insufficient documentation

## 2016-06-01 DIAGNOSIS — G8929 Other chronic pain: Secondary | ICD-10-CM | POA: Diagnosis not present

## 2016-06-01 LAB — HEPATIC FUNCTION PANEL
ALT: 24 U/L (ref 0–53)
AST: 19 U/L (ref 0–37)
Albumin: 4.2 g/dL (ref 3.5–5.2)
Alkaline Phosphatase: 53 U/L (ref 39–117)
Bilirubin, Direct: 0.1 mg/dL (ref 0.0–0.3)
Total Bilirubin: 0.5 mg/dL (ref 0.2–1.2)
Total Protein: 6.6 g/dL (ref 6.0–8.3)

## 2016-06-01 LAB — LIPID PANEL
CHOL/HDL RATIO: 3
Cholesterol: 134 mg/dL (ref 0–200)
HDL: 38.6 mg/dL — AB (ref >39.00)
LDL CALC: 58 mg/dL (ref 0–99)
NONHDL: 94.91
Triglycerides: 187 mg/dL — ABNORMAL HIGH (ref 0.0–149.0)
VLDL: 37.4 mg/dL (ref 0.0–40.0)

## 2016-06-01 LAB — CBC WITH DIFFERENTIAL/PLATELET
BASOS ABS: 0 10*3/uL (ref 0.0–0.1)
Basophils Relative: 0.6 % (ref 0.0–3.0)
EOS PCT: 2.4 % (ref 0.0–5.0)
Eosinophils Absolute: 0.2 10*3/uL (ref 0.0–0.7)
HCT: 43.9 % (ref 39.0–52.0)
HEMOGLOBIN: 15.2 g/dL (ref 13.0–17.0)
Lymphocytes Relative: 27.3 % (ref 12.0–46.0)
Lymphs Abs: 2 10*3/uL (ref 0.7–4.0)
MCHC: 34.6 g/dL (ref 30.0–36.0)
MCV: 92.9 fl (ref 78.0–100.0)
MONOS PCT: 8.1 % (ref 3.0–12.0)
Monocytes Absolute: 0.6 10*3/uL (ref 0.1–1.0)
Neutro Abs: 4.6 10*3/uL (ref 1.4–7.7)
Neutrophils Relative %: 61.6 % (ref 43.0–77.0)
Platelets: 253 10*3/uL (ref 150.0–400.0)
RBC: 4.73 Mil/uL (ref 4.22–5.81)
RDW: 13.1 % (ref 11.5–15.5)
WBC: 7.4 10*3/uL (ref 4.0–10.5)

## 2016-06-01 LAB — HEMOGLOBIN A1C: Hgb A1c MFr Bld: 7.7 % — ABNORMAL HIGH (ref 4.6–6.5)

## 2016-06-01 LAB — BASIC METABOLIC PANEL
BUN: 10 mg/dL (ref 6–23)
CALCIUM: 9.3 mg/dL (ref 8.4–10.5)
CHLORIDE: 103 meq/L (ref 96–112)
CO2: 26 mEq/L (ref 19–32)
CREATININE: 0.83 mg/dL (ref 0.40–1.50)
GFR: 104.58 mL/min (ref >60.00)
Glucose, Bld: 103 mg/dL — ABNORMAL HIGH (ref 70–99)
Potassium: 4.1 mEq/L (ref 3.5–5.1)
Sodium: 137 mEq/L (ref 135–145)

## 2016-06-01 LAB — TSH: TSH: 0.68 u[IU]/mL (ref 0.35–4.50)

## 2016-06-01 MED ORDER — HYDROCODONE-ACETAMINOPHEN 5-325 MG PO TABS: 1.0000 | ORAL_TABLET | Freq: Four times a day (QID) | ORAL | 0 refills | Status: DC | PRN

## 2016-06-01 MED ORDER — TIZANIDINE HCL 4 MG PO TABS: 4.0000 mg | ORAL_TABLET | Freq: Four times a day (QID) | ORAL | 0 refills | Status: DC | PRN

## 2016-06-01 NOTE — Patient Instructions (Signed)
Follow up in 3-4 months to recheck diabetes We'll notify you of your lab results and make any changes if needed Continue to work on healthy diet and regular exercise- this is very important! Please call and schedule your eye exam We will call you with your Urology referral for the enlarged prostate symptoms Call with any questions or concerns Have a great summer!!!

## 2016-06-01 NOTE — Progress Notes (Signed)
Pre visit review using our clinic review tool, if applicable. No additional management support is needed unless otherwise documented below in the visit note. 

## 2016-06-01 NOTE — Progress Notes (Signed)
   Subjective:    Patient ID: Caleb White, male    DOB: [birth year], 51 y.o.   MRN: [id number]  HPI CPE- UTD on Tdap.  Pt is not exercising but w/ addition of CPAP has more energy to start being more active.  DM- chronic problem, on Januvia, glipizide.  UTD on foot exam.  Due for eye exam.  On ACE for renal protection.    Review of Systems Patient reports no vision/hearing changes, anorexia, fever ,adenopathy, persistant/recurrent hoarseness, swallowing issues, chest pain, palpitations, edema, persistant/recurrent cough, hemoptysis, dyspnea (rest,exertional, paroxysmal nocturnal), gastrointestinal  bleeding (melena, rectal bleeding), abdominal pain, excessive heart burn, GU symptoms (dysuria, hematuria, voiding/incontinence issues) syncope, focal weakness, memory loss, numbness & tingling, skin/hair/nail changes, depression, anxiety, abnormal bruising/bleeding, musculoskeletal symptoms/signs.     Objective:   Physical Exam General Appearance:    Alert, cooperative, no distress, appears stated age  Head:    Normocephalic, without obvious abnormality, atraumatic  Eyes:    PERRL, conjunctiva/corneas clear, EOM's intact, fundi    benign, both eyes       Ears:    Normal TM's and external ear canals, both ears  Nose:   Nares normal, septum midline, mucosa normal, no drainage   or sinus tenderness  Throat:   Lips, mucosa, and tongue normal; teeth and gums normal  Neck:   Supple, symmetrical, trachea midline, no adenopathy;       thyroid:  No enlargement/tenderness/nodules  Back:     Symmetric, no curvature, ROM normal, no CVA tenderness  Lungs:     Clear to auscultation bilaterally, respirations unlabored  Chest wall:    No tenderness or deformity  Heart:    Regular rate and rhythm, S1 and S2 normal, no murmur, rub   or gallop  Abdomen:     Soft, non-tender, bowel sounds active all four quadrants,    no masses, no organomegaly  Genitalia:    Deferred to urology  Rectal:    Extremities:    Extremities normal, atraumatic, no cyanosis or edema  Pulses:   2+ and symmetric all extremities  Skin:   Skin color, texture, turgor normal, no rashes or lesions  Lymph nodes:   Cervical, supraclavicular, and axillary nodes normal  Neurologic:   CNII-XII intact. Normal strength, sensation and reflexes      throughout          Assessment & Plan:

## 2016-06-01 NOTE — Assessment & Plan Note (Signed)
Pt's PE unchanged from previous and WNL w/ exception of morbid obesity.  Not due for colonoscopy until next year.  UTD on immunizations.  Refer to urology.  Check labs.  Anticipatory guidance provided.

## 2016-06-01 NOTE — Assessment & Plan Note (Signed)
Chronic problem.  Hx of adequate control on Januvia and Glipizide.  Due for eye exam- pt plans to schedule.  On ACE for renal protection.  UTD on foot exam.  Check labs.  Adjust meds prn

## 2016-06-01 NOTE — Assessment & Plan Note (Signed)
Pt continues to have sxs despite Flomax.  Refer to urology

## 2016-06-06 ENCOUNTER — Other Ambulatory Visit: Payer: Self-pay | Admitting: General Practice

## 2016-06-06 MED ORDER — FLUOXETINE HCL 20 MG PO TABS: 20.0000 mg | ORAL_TABLET | Freq: Every day | ORAL | 1 refills | Status: DC

## 2016-06-19 DIAGNOSIS — R112 Nausea with vomiting, unspecified: Secondary | ICD-10-CM | POA: Diagnosis not present

## 2016-06-19 DIAGNOSIS — L03115 Cellulitis of right lower limb: Secondary | ICD-10-CM | POA: Diagnosis not present

## 2016-06-19 DIAGNOSIS — Z91040 Latex allergy status: Secondary | ICD-10-CM | POA: Diagnosis not present

## 2016-06-19 DIAGNOSIS — M7989 Other specified soft tissue disorders: Secondary | ICD-10-CM | POA: Diagnosis not present

## 2016-06-19 DIAGNOSIS — Z7984 Long term (current) use of oral hypoglycemic drugs: Secondary | ICD-10-CM | POA: Diagnosis not present

## 2016-06-19 DIAGNOSIS — E1165 Type 2 diabetes mellitus with hyperglycemia: Secondary | ICD-10-CM | POA: Diagnosis not present

## 2016-06-19 DIAGNOSIS — Z9102 Food additives allergy status: Secondary | ICD-10-CM | POA: Diagnosis not present

## 2016-06-19 DIAGNOSIS — Z87891 Personal history of nicotine dependence: Secondary | ICD-10-CM | POA: Diagnosis not present

## 2016-06-19 DIAGNOSIS — R197 Diarrhea, unspecified: Secondary | ICD-10-CM | POA: Diagnosis not present

## 2016-06-19 DIAGNOSIS — Z6841 Body Mass Index (BMI) 40.0 and over, adult: Secondary | ICD-10-CM | POA: Diagnosis not present

## 2016-06-19 DIAGNOSIS — E669 Obesity, unspecified: Secondary | ICD-10-CM | POA: Diagnosis not present

## 2016-06-19 DIAGNOSIS — E119 Type 2 diabetes mellitus without complications: Secondary | ICD-10-CM | POA: Diagnosis not present

## 2016-06-19 DIAGNOSIS — Z888 Allergy status to other drugs, medicaments and biological substances status: Secondary | ICD-10-CM | POA: Diagnosis not present

## 2016-06-19 DIAGNOSIS — M1990 Unspecified osteoarthritis, unspecified site: Secondary | ICD-10-CM | POA: Diagnosis not present

## 2016-06-19 DIAGNOSIS — E11628 Type 2 diabetes mellitus with other skin complications: Secondary | ICD-10-CM | POA: Diagnosis not present

## 2016-06-19 DIAGNOSIS — F329 Major depressive disorder, single episode, unspecified: Secondary | ICD-10-CM | POA: Diagnosis not present

## 2016-06-19 DIAGNOSIS — Z880 Allergy status to penicillin: Secondary | ICD-10-CM | POA: Diagnosis not present

## 2016-06-19 DIAGNOSIS — J8410 Pulmonary fibrosis, unspecified: Secondary | ICD-10-CM | POA: Diagnosis not present

## 2016-06-19 DIAGNOSIS — A419 Sepsis, unspecified organism: Secondary | ICD-10-CM | POA: Diagnosis not present

## 2016-06-19 DIAGNOSIS — G4730 Sleep apnea, unspecified: Secondary | ICD-10-CM | POA: Diagnosis not present

## 2016-06-19 DIAGNOSIS — R509 Fever, unspecified: Secondary | ICD-10-CM | POA: Diagnosis not present

## 2016-06-19 DIAGNOSIS — M4800 Spinal stenosis, site unspecified: Secondary | ICD-10-CM | POA: Diagnosis not present

## 2016-06-22 DIAGNOSIS — B9562 Methicillin resistant Staphylococcus aureus infection as the cause of diseases classified elsewhere: Secondary | ICD-10-CM | POA: Diagnosis not present

## 2016-06-22 DIAGNOSIS — I10 Essential (primary) hypertension: Secondary | ICD-10-CM | POA: Diagnosis not present

## 2016-06-22 DIAGNOSIS — F329 Major depressive disorder, single episode, unspecified: Secondary | ICD-10-CM | POA: Diagnosis not present

## 2016-06-22 DIAGNOSIS — Z87891 Personal history of nicotine dependence: Secondary | ICD-10-CM | POA: Diagnosis not present

## 2016-06-22 DIAGNOSIS — L03115 Cellulitis of right lower limb: Secondary | ICD-10-CM | POA: Diagnosis not present

## 2016-06-22 DIAGNOSIS — E119 Type 2 diabetes mellitus without complications: Secondary | ICD-10-CM | POA: Diagnosis not present

## 2016-06-22 DIAGNOSIS — Z79899 Other long term (current) drug therapy: Secondary | ICD-10-CM | POA: Diagnosis not present

## 2016-06-22 DIAGNOSIS — G4733 Obstructive sleep apnea (adult) (pediatric): Secondary | ICD-10-CM | POA: Diagnosis not present

## 2016-06-22 DIAGNOSIS — R509 Fever, unspecified: Secondary | ICD-10-CM | POA: Diagnosis not present

## 2016-06-22 DIAGNOSIS — Z7984 Long term (current) use of oral hypoglycemic drugs: Secondary | ICD-10-CM | POA: Diagnosis not present

## 2016-06-22 DIAGNOSIS — L0390 Cellulitis, unspecified: Secondary | ICD-10-CM | POA: Diagnosis not present

## 2016-06-26 DIAGNOSIS — I10 Essential (primary) hypertension: Secondary | ICD-10-CM | POA: Diagnosis not present

## 2016-06-26 DIAGNOSIS — Z9989 Dependence on other enabling machines and devices: Secondary | ICD-10-CM | POA: Diagnosis not present

## 2016-06-26 DIAGNOSIS — G4733 Obstructive sleep apnea (adult) (pediatric): Secondary | ICD-10-CM | POA: Diagnosis not present

## 2016-06-28 DIAGNOSIS — E119 Type 2 diabetes mellitus without complications: Secondary | ICD-10-CM | POA: Diagnosis not present

## 2016-06-28 DIAGNOSIS — Z09 Encounter for follow-up examination after completed treatment for conditions other than malignant neoplasm: Secondary | ICD-10-CM | POA: Diagnosis not present

## 2016-06-28 DIAGNOSIS — I10 Essential (primary) hypertension: Secondary | ICD-10-CM | POA: Diagnosis not present

## 2016-06-28 DIAGNOSIS — L03115 Cellulitis of right lower limb: Secondary | ICD-10-CM | POA: Diagnosis not present

## 2016-06-29 ENCOUNTER — Encounter: Payer: Self-pay | Admitting: Family Medicine

## 2016-06-29 ENCOUNTER — Ambulatory Visit (INDEPENDENT_AMBULATORY_CARE_PROVIDER_SITE_OTHER): Payer: BLUE CROSS/BLUE SHIELD | Admitting: Family Medicine

## 2016-06-29 VITALS — BP 132/82 | HR 63 | Temp 98.8°F | Resp 16 | Ht 67.0 in | Wt 349.0 lb

## 2016-06-29 DIAGNOSIS — Z6841 Body Mass Index (BMI) 40.0 and over, adult: Secondary | ICD-10-CM

## 2016-06-29 DIAGNOSIS — L03115 Cellulitis of right lower limb: Secondary | ICD-10-CM

## 2016-06-29 NOTE — Progress Notes (Signed)
Pre visit review using our clinic review tool, if applicable. No additional management support is needed unless otherwise documented below in the visit note. 

## 2016-06-29 NOTE — Progress Notes (Signed)
   Subjective:    Patient ID: Caleb White, male    DOB: 10-31-1965, 51 y.o.   MRN: [id number]  [facility] f/u- pt was admitted to [facility] 6/4-6/6 w/ R LE cellulitis.  Was treated w/ Vanc and Dalvance (weekly abx injxn).  Was readmitted 6/7-6/10 due to increased pain and redness.  Doppler was negative for DVT.  Pain is much improved, redness and swelling are much improved.  Not currently on abx.  Pt reports there was no inciting injury.    Morbid obesity- pt is interested in referral to [facility].  Pt is asking for referral in [place].   Review of Systems For ROS see HPI     Objective:   Physical Exam  Constitutional: He is oriented to person, place, and time. He appears well-developed and well-nourished. No distress.  Morbid obesity  HENT:  Head: Normocephalic and atraumatic.  Neurological: He is alert and oriented to person, place, and time.  Skin: Skin is warm and dry. No rash noted. No erythema.  Cellulitis of R lower leg has resolved  Psychiatric: He has a normal mood and affect. His behavior is normal. Thought content normal.  Vitals reviewed.         Assessment & Plan:  Cellulitis- new.  Reviewed pt's hospital record from both admissions in [place].  Pt is not currently on abx but redness and swelling have almost completely resolved.  No longer having pain.  Reviewed importance of regular skin checks to prevent similar severity in the future.  Pt expressed understanding and is in agreement w/ plan.  Morbid obesity- ongoing issue for pt.  Wife is with him today and is very supportive.  She has implemented changes at home that have resulted in 13 lb weight loss.  Applauded his efforts.  Refer to [facility] for ongoing management as he is not interested in surgery but does want to make changes.  Will follow.

## 2016-06-29 NOTE — Patient Instructions (Signed)
Follow up as scheduled Continue to work on healthy diet and regular exercise- you're down 13 lbs!!!! Continue to monitor for worsening redness and swelling Call [facility] at [phone] (this is their [facility] Location) Call with any questions or concerns Hang in there!!!

## 2016-07-06 ENCOUNTER — Encounter: Payer: Self-pay | Admitting: Family Medicine

## 2016-07-06 ENCOUNTER — Ambulatory Visit (INDEPENDENT_AMBULATORY_CARE_PROVIDER_SITE_OTHER): Payer: BLUE CROSS/BLUE SHIELD | Admitting: Family Medicine

## 2016-07-06 VITALS — BP 131/80 | HR 66 | Resp 16 | Ht 67.0 in | Wt 345.4 lb

## 2016-07-06 DIAGNOSIS — R319 Hematuria, unspecified: Secondary | ICD-10-CM | POA: Diagnosis not present

## 2016-07-06 DIAGNOSIS — M545 Low back pain, unspecified: Secondary | ICD-10-CM

## 2016-07-06 LAB — POCT URINALYSIS DIPSTICK
Bilirubin, UA: NEGATIVE
COLOR UA: NEGATIVE
Glucose, UA: NEGATIVE
Ketones, UA: NEGATIVE
Leukocytes, UA: NEGATIVE
Nitrite, UA: NEGATIVE
PH UA: 5 (ref 5.0–8.0)
PROTEIN UA: NEGATIVE
SPEC GRAV UA: 1.015 (ref 1.010–1.025)
Urobilinogen, UA: 0.2 E.U./dL

## 2016-07-06 MED ORDER — PREDNISONE 10 MG PO TABS: ORAL_TABLET | ORAL | 0 refills | Status: DC

## 2016-07-06 NOTE — Progress Notes (Signed)
   Subjective:    Patient ID: Caleb White, male    DOB: 12/06/1965, 51 y.o.   MRN: [id number]  HPI Back pain- sxs started ~1 week ago.  L sided.  Now having difficulty rising from seated position.  Having pain both w/ sitting/standing.  Lying down is the most comfortable.  No radiation of pain into buttock or thighs.  No numbness/tingling.  No bowel or bladder incontinence.  No recent heavy lifting.  Pt was recently hospitalized but no other changes in activity.  Has not tried tylenol or ibuprofen.  2 hydrocodone will improve pain, as does muscle relaxer (zanaflex).   Review of Systems For ROS see HPI     Objective:   Physical Exam  Constitutional: He is oriented to person, place, and time. He appears well-developed and well-nourished. No distress.  Morbidly obese  Cardiovascular: Intact distal pulses.   Musculoskeletal: He exhibits tenderness (TTP over L lumbar paraspinal muscle).  Neurological: He is alert and oriented to person, place, and time. No cranial nerve deficit. Coordination normal.  (-) SLR bilaterally  Skin: Skin is warm and dry. No rash noted. No erythema.  Psychiatric: He has a normal mood and affect. His behavior is normal. Thought content normal.  Vitals reviewed.         Assessment & Plan:  L lumbar low back pain- new.  No red flags on hx or PE.  UA unremarkable.  Suspect this is due to his recent hospitalization and now lumbar spasm.  Start prednisone taper- hold mobic.  Continue Zanaflex and hydrocodone prn.  Reviewed supportive care and red flags that should prompt return.  Pt expressed understanding and is in agreement w/ plan.

## 2016-07-06 NOTE — Progress Notes (Signed)
Pre visit review using our clinic review tool, if applicable. No additional management support is needed unless otherwise documented below in the visit note. 

## 2016-07-06 NOTE — Patient Instructions (Signed)
Follow up as needed/scheduled START the Prednisone as directed.  Take w/ food. HOLD the Meloxicam until you have completed the Prednisone Continue the Tizanidine and Hydrocodone as needed HEAT! Do some gentle stretching to avoid stiffness Drink plenty of fluids Make sure to watch your carb intake while on the Prednisone Call with any questions or concerns- particularly if not improving! Hang in there!!

## 2016-07-07 LAB — URINE CULTURE: Organism ID, Bacteria: NO GROWTH

## 2016-07-12 ENCOUNTER — Encounter: Payer: Self-pay | Admitting: Family Medicine

## 2016-07-12 DIAGNOSIS — G8929 Other chronic pain: Secondary | ICD-10-CM

## 2016-07-12 DIAGNOSIS — M2550 Pain in unspecified joint: Principal | ICD-10-CM

## 2016-07-13 MED ORDER — HYDROCODONE-ACETAMINOPHEN 5-325 MG PO TABS: 1.0000 | ORAL_TABLET | Freq: Four times a day (QID) | ORAL | 0 refills | Status: DC | PRN

## 2016-07-13 NOTE — Telephone Encounter (Signed)
Last OV 07/06/16 Hydrocodone last filled 06/01/16 #120 with 0

## 2016-07-20 DIAGNOSIS — Z6841 Body Mass Index (BMI) 40.0 and over, adult: Secondary | ICD-10-CM | POA: Diagnosis not present

## 2016-07-20 DIAGNOSIS — E785 Hyperlipidemia, unspecified: Secondary | ICD-10-CM | POA: Diagnosis not present

## 2016-07-20 DIAGNOSIS — I10 Essential (primary) hypertension: Secondary | ICD-10-CM | POA: Diagnosis not present

## 2016-07-20 DIAGNOSIS — E119 Type 2 diabetes mellitus without complications: Secondary | ICD-10-CM | POA: Diagnosis not present

## 2016-08-01 DIAGNOSIS — Z7189 Other specified counseling: Secondary | ICD-10-CM | POA: Diagnosis not present

## 2016-08-01 DIAGNOSIS — Z6841 Body Mass Index (BMI) 40.0 and over, adult: Secondary | ICD-10-CM | POA: Diagnosis not present

## 2016-08-01 DIAGNOSIS — F320 Major depressive disorder, single episode, mild: Secondary | ICD-10-CM | POA: Diagnosis not present

## 2016-08-02 ENCOUNTER — Ambulatory Visit (INDEPENDENT_AMBULATORY_CARE_PROVIDER_SITE_OTHER): Payer: BLUE CROSS/BLUE SHIELD | Admitting: Family Medicine

## 2016-08-02 ENCOUNTER — Encounter: Payer: Self-pay | Admitting: Family Medicine

## 2016-08-02 VITALS — BP 128/82 | HR 78 | Temp 98.0°F | Resp 16 | Ht 67.0 in | Wt 345.0 lb

## 2016-08-02 DIAGNOSIS — F331 Major depressive disorder, recurrent, moderate: Secondary | ICD-10-CM

## 2016-08-02 DIAGNOSIS — J0100 Acute maxillary sinusitis, unspecified: Secondary | ICD-10-CM

## 2016-08-02 MED ORDER — ALPRAZOLAM 0.5 MG PO TABS: 0.5000 mg | ORAL_TABLET | Freq: Two times a day (BID) | ORAL | 1 refills | Status: DC | PRN

## 2016-08-02 MED ORDER — SULFAMETHOXAZOLE-TRIMETHOPRIM 800-160 MG PO TABS: 1.0000 | ORAL_TABLET | Freq: Two times a day (BID) | ORAL | 0 refills | Status: DC

## 2016-08-02 NOTE — Patient Instructions (Signed)
Follow up in 4-6 weeks to recheck mood Start the Bactrim twice daily (w/ food) for the sinus infection Drink plenty of fluids REST! Continue the Wellbutrin and Fluoxetine daily Use the Alprazolam as needed for panicked moments Call with any questions or concerns Hang in there!!!  You can do this!

## 2016-08-02 NOTE — Progress Notes (Signed)
Pre visit review using our clinic review tool, if applicable. No additional management support is needed unless otherwise documented below in the visit note. 

## 2016-08-02 NOTE — Progress Notes (Signed)
   Subjective:    Patient ID: Caleb White, male    DOB: [birth year], 51 y.o.   MRN: [id number]  HPI URI- + facial pain/pressure, nasal congestion, wife reports 'breathing funny'.  sxs started 'a couple of weeks' ago and have been worsening.  Cough is productive.  No known sick contacts.  + L ear pain.  Depression- brother's children were taken by CPS and he has stepped in to take 2 of the 3 children (3rd child is staying w/ in-laws).  Niece is now cutting herself.  Pt himself had a 'terrible childhood'.  On Wellbutrin and Fluoxetine.     Review of Systems For ROS see HPI     Objective:   Physical Exam  Constitutional: He appears well-developed and well-nourished. No distress.  HENT:  Head: Normocephalic and atraumatic.  Right Ear: Tympanic membrane normal.  Left Ear: Tympanic membrane normal.  Nose: Mucosal edema and rhinorrhea present. Right sinus exhibits maxillary sinus tenderness. Right sinus exhibits no frontal sinus tenderness. Left sinus exhibits maxillary sinus tenderness. Left sinus exhibits no frontal sinus tenderness.  Mouth/Throat: Mucous membranes are normal. Oropharyngeal exudate and posterior oropharyngeal erythema present. No posterior oropharyngeal edema.  + PND  Eyes: Pupils are equal, round, and reactive to light. Conjunctivae and EOM are normal.  Neck: Normal range of motion. Neck supple.  Cardiovascular: Normal rate, regular rhythm and normal heart sounds.   Pulmonary/Chest: Effort normal and breath sounds normal. No respiratory distress. He has no wheezes.  + hacking cough  Lymphadenopathy:    He has no cervical adenopathy.  Skin: Skin is warm and dry.          Assessment & Plan:  Maxillary sinusitis- pt's sxs and PE consistent w/ infxn.  Start Bactrim due to PCN allergy.  Reviewed supportive care and red flags that should prompt return.  Pt expressed understanding and is in agreement w/ plan.   Depression- deteriorated due to his recent stressful  family situation.  Add Alprazolam as pt is already on Prozac and Wellbutrin.  Recommended counseling- pt to call and schedule (names and numbers provided).  Will follow closely.  Pt expressed understanding and is in agreement w/ plan.

## 2016-08-10 DIAGNOSIS — Z6841 Body Mass Index (BMI) 40.0 and over, adult: Secondary | ICD-10-CM | POA: Diagnosis not present

## 2016-08-10 DIAGNOSIS — I10 Essential (primary) hypertension: Secondary | ICD-10-CM | POA: Diagnosis not present

## 2016-08-10 DIAGNOSIS — E785 Hyperlipidemia, unspecified: Secondary | ICD-10-CM | POA: Diagnosis not present

## 2016-08-14 DIAGNOSIS — G4733 Obstructive sleep apnea (adult) (pediatric): Secondary | ICD-10-CM | POA: Diagnosis not present

## 2016-08-15 DIAGNOSIS — Z7189 Other specified counseling: Secondary | ICD-10-CM | POA: Diagnosis not present

## 2016-08-15 DIAGNOSIS — F320 Major depressive disorder, single episode, mild: Secondary | ICD-10-CM | POA: Diagnosis not present

## 2016-08-15 DIAGNOSIS — F54 Psychological and behavioral factors associated with disorders or diseases classified elsewhere: Secondary | ICD-10-CM | POA: Diagnosis not present

## 2016-08-18 ENCOUNTER — Encounter: Payer: Self-pay | Admitting: Family Medicine

## 2016-08-18 DIAGNOSIS — G8929 Other chronic pain: Secondary | ICD-10-CM

## 2016-08-18 DIAGNOSIS — M2550 Pain in unspecified joint: Principal | ICD-10-CM

## 2016-08-18 MED ORDER — ESZOPICLONE 2 MG PO TABS: 2.0000 mg | ORAL_TABLET | Freq: Every evening | ORAL | 0 refills | Status: DC | PRN

## 2016-08-18 NOTE — Telephone Encounter (Signed)
Last OV 08/02/16 lunesta last filled 02/07/16 #30 with 3

## 2016-08-18 NOTE — Telephone Encounter (Signed)
Last OV 08/02/16 Hydrocodone last filled 07/13/16 #120 with 0

## 2016-08-18 NOTE — Telephone Encounter (Signed)
Last OV Hydrocodone last filled  CSC on file, UDS low risk

## 2016-08-21 DIAGNOSIS — I10 Essential (primary) hypertension: Secondary | ICD-10-CM | POA: Diagnosis not present

## 2016-08-21 DIAGNOSIS — Z9989 Dependence on other enabling machines and devices: Secondary | ICD-10-CM | POA: Diagnosis not present

## 2016-08-21 DIAGNOSIS — G4733 Obstructive sleep apnea (adult) (pediatric): Secondary | ICD-10-CM | POA: Diagnosis not present

## 2016-08-22 DIAGNOSIS — Z3009 Encounter for other general counseling and advice on contraception: Secondary | ICD-10-CM | POA: Diagnosis not present

## 2016-08-23 ENCOUNTER — Other Ambulatory Visit: Payer: Self-pay | Admitting: Family Medicine

## 2016-08-23 MED ORDER — HYDROCODONE-ACETAMINOPHEN 5-325 MG PO TABS: 1.0000 | ORAL_TABLET | Freq: Four times a day (QID) | ORAL | 0 refills | Status: DC | PRN

## 2016-08-25 ENCOUNTER — Other Ambulatory Visit: Payer: Self-pay | Admitting: General Practice

## 2016-08-25 MED ORDER — FLUOXETINE HCL 20 MG PO TABS: 20.0000 mg | ORAL_TABLET | Freq: Every day | ORAL | 1 refills | Status: DC

## 2016-09-04 ENCOUNTER — Encounter: Payer: Self-pay | Admitting: General Practice

## 2016-09-04 ENCOUNTER — Ambulatory Visit (INDEPENDENT_AMBULATORY_CARE_PROVIDER_SITE_OTHER): Payer: BLUE CROSS/BLUE SHIELD | Admitting: Family Medicine

## 2016-09-04 ENCOUNTER — Encounter: Payer: Self-pay | Admitting: Family Medicine

## 2016-09-04 VITALS — BP 131/90 | HR 75 | Resp 17 | Ht 67.0 in | Wt 339.5 lb

## 2016-09-04 DIAGNOSIS — E119 Type 2 diabetes mellitus without complications: Secondary | ICD-10-CM

## 2016-09-04 DIAGNOSIS — F331 Major depressive disorder, recurrent, moderate: Secondary | ICD-10-CM

## 2016-09-04 LAB — BASIC METABOLIC PANEL
BUN: 11 mg/dL (ref 6–23)
CALCIUM: 9.2 mg/dL (ref 8.4–10.5)
CHLORIDE: 108 meq/L (ref 96–112)
CO2: 22 mEq/L (ref 19–32)
CREATININE: 0.93 mg/dL (ref 0.40–1.50)
GFR: 91.62 mL/min (ref >60.00)
Glucose, Bld: 134 mg/dL — ABNORMAL HIGH (ref 70–99)
Potassium: 4.1 mEq/L (ref 3.5–5.1)
Sodium: 139 mEq/L (ref 135–145)

## 2016-09-04 LAB — HEMOGLOBIN A1C: Hgb A1c MFr Bld: 6.8 % — ABNORMAL HIGH (ref 4.6–6.5)

## 2016-09-04 NOTE — Progress Notes (Signed)
Pre visit review using our clinic review tool, if applicable. No additional management support is needed unless otherwise documented below in the visit note. 

## 2016-09-04 NOTE — Assessment & Plan Note (Signed)
Chronic problem.  Stress has increased dramatically by taking in his nieces when brother lost custody.  He is managing but struggling.  Is considering counseling.  No med changes at this time.

## 2016-09-04 NOTE — Assessment & Plan Note (Signed)
Chronic problem.  Tolerating meds w/o difficulty.  UTD on foot exam, on ACE for renal protection.  Overdue for eye exam- pt plans to schedule.  Stressed need for healthy diet and regular exercise.  Check labs.  Adjust meds prn

## 2016-09-04 NOTE — Progress Notes (Signed)
   Subjective:    Patient ID: Caleb Quam., male    DOB: Mar 12, 1965, 51 y.o.   MRN: [id number]  HPI DM- chronic problem, on Glipizide and Januvia daily.  Last A1C 7.7.  Pt has lost 6 lbs since last visit.  Pt is working on diet and exercise.  On ACE for renal protection.  UTD on foot exam.  Due for eye exam.  Pt is not checking CBGs recently.  Denies symptomatic lows.    Depression- chronic problem, on Prozac and Wellbutrin.  Increased family stress recently w/ taking in his nieces when his brother lost custody.  Pt feels that things are 'ok'.  Not interested in med changes at this time.   Review of Systems For ROS see HPI     Objective:   Physical Exam  Constitutional: He is oriented to person, place, and time. He appears well-developed and well-nourished. No distress.  obese  HENT:  Head: Normocephalic and atraumatic.  Eyes: Pupils are equal, round, and reactive to light. Conjunctivae and EOM are normal.  Neck: Normal range of motion. Neck supple. No thyromegaly present.  Cardiovascular: Normal rate, regular rhythm, normal heart sounds and intact distal pulses.   No murmur heard. Pulmonary/Chest: Effort normal and breath sounds normal. No respiratory distress.  Abdominal: Soft. Bowel sounds are normal. He exhibits no distension.  Musculoskeletal: He exhibits no edema.  Lymphadenopathy:    He has no cervical adenopathy.  Neurological: He is alert and oriented to person, place, and time. No cranial nerve deficit.  Skin: Skin is warm and dry.  Psychiatric: He has a normal mood and affect. His behavior is normal.  Vitals reviewed.         Assessment & Plan:

## 2016-09-04 NOTE — Patient Instructions (Signed)
Follow up in 3-4 months to recheck diabetes and cholesterol We'll notify you of your lab results and make any changes if needed Continue to work on healthy diet and regular exercise- you can do it!!! Please consider starting counseling as this issue doesn't seem to be ending any time soon Call with any questions or concerns Hang in there!!!

## 2016-09-05 ENCOUNTER — Other Ambulatory Visit: Payer: Self-pay | Admitting: Family Medicine

## 2016-09-07 DIAGNOSIS — Z6841 Body Mass Index (BMI) 40.0 and over, adult: Secondary | ICD-10-CM | POA: Diagnosis not present

## 2016-09-07 DIAGNOSIS — G4733 Obstructive sleep apnea (adult) (pediatric): Secondary | ICD-10-CM | POA: Diagnosis not present

## 2016-09-07 DIAGNOSIS — E785 Hyperlipidemia, unspecified: Secondary | ICD-10-CM | POA: Diagnosis not present

## 2016-09-07 DIAGNOSIS — I10 Essential (primary) hypertension: Secondary | ICD-10-CM | POA: Diagnosis not present

## 2016-09-07 DIAGNOSIS — Z9989 Dependence on other enabling machines and devices: Secondary | ICD-10-CM | POA: Diagnosis not present

## 2016-09-07 DIAGNOSIS — E119 Type 2 diabetes mellitus without complications: Secondary | ICD-10-CM | POA: Diagnosis not present

## 2016-09-24 ENCOUNTER — Other Ambulatory Visit: Payer: Self-pay | Admitting: Family Medicine

## 2016-09-25 ENCOUNTER — Other Ambulatory Visit: Payer: Self-pay | Admitting: Family Medicine

## 2016-09-25 ENCOUNTER — Other Ambulatory Visit: Payer: Self-pay | Admitting: Physician Assistant

## 2016-09-25 NOTE — Telephone Encounter (Signed)
Last OV 09/04/16 lunesta last filled 08/18/16 #30 with 0

## 2016-09-26 NOTE — Telephone Encounter (Signed)
Medication filled to pharmacy as requested.   

## 2016-09-28 ENCOUNTER — Encounter: Payer: Self-pay | Admitting: Family Medicine

## 2016-09-28 DIAGNOSIS — G8929 Other chronic pain: Secondary | ICD-10-CM

## 2016-09-28 DIAGNOSIS — M2550 Pain in unspecified joint: Principal | ICD-10-CM

## 2016-10-02 MED ORDER — HYDROCODONE-ACETAMINOPHEN 5-325 MG PO TABS: 1.0000 | ORAL_TABLET | Freq: Four times a day (QID) | ORAL | 0 refills | Status: DC | PRN

## 2016-10-02 NOTE — Telephone Encounter (Signed)
Pt informed rx available at front desk for pick up.

## 2016-10-02 NOTE — Telephone Encounter (Signed)
Last OV 09/04/16 Hydrocodone last filled 08/23/16 #120 with 0

## 2016-10-06 ENCOUNTER — Other Ambulatory Visit: Payer: Self-pay | Admitting: Family Medicine

## 2016-10-06 ENCOUNTER — Ambulatory Visit (INDEPENDENT_AMBULATORY_CARE_PROVIDER_SITE_OTHER): Payer: BLUE CROSS/BLUE SHIELD

## 2016-10-06 DIAGNOSIS — Z23 Encounter for immunization: Secondary | ICD-10-CM | POA: Diagnosis not present

## 2016-10-08 DIAGNOSIS — Z9989 Dependence on other enabling machines and devices: Secondary | ICD-10-CM | POA: Diagnosis not present

## 2016-10-08 DIAGNOSIS — G4733 Obstructive sleep apnea (adult) (pediatric): Secondary | ICD-10-CM | POA: Diagnosis not present

## 2016-10-15 ENCOUNTER — Other Ambulatory Visit: Payer: Self-pay | Admitting: Family Medicine

## 2016-10-16 NOTE — Telephone Encounter (Signed)
Medication filled to pharmacy as requested.   

## 2016-10-16 NOTE — Telephone Encounter (Signed)
Last OV 09/04/16 Delatestryl last filled 04/24/16 23mL with 1

## 2016-10-18 ENCOUNTER — Telehealth: Payer: Self-pay | Admitting: General Practice

## 2016-10-18 NOTE — Telephone Encounter (Signed)
Pharmacy advised that they are blocked form filling the testosterone (delatestryl) that PCP sent in last week. Of for alternative testosterone cypionate?   If so please advise on SIG and dose

## 2016-10-25 MED ORDER — TESTOSTERONE CYPIONATE 100 MG/ML IM SOLN: 100.0000 mg | INTRAMUSCULAR | 0 refills | Status: DC

## 2016-10-25 NOTE — Telephone Encounter (Signed)
Ok for Testosterone Cypionate injection 100mg /ml- 100mg  (1 ml) injection every 2 weeks

## 2016-10-25 NOTE — Telephone Encounter (Signed)
Medication filled to pharmacy as requested.   

## 2016-10-25 NOTE — Addendum Note (Signed)
Addended by: Desmond Dike L on: 10/25/2016 10:06 AM   Modules accepted: Orders

## 2016-10-30 ENCOUNTER — Telehealth: Payer: Self-pay | Admitting: *Deleted

## 2016-10-30 NOTE — Telephone Encounter (Signed)
PA started through cover my meds  KEY: [id number]  Awaiting approval from insurance

## 2016-11-07 DIAGNOSIS — G4733 Obstructive sleep apnea (adult) (pediatric): Secondary | ICD-10-CM | POA: Diagnosis not present

## 2016-11-07 DIAGNOSIS — Z9989 Dependence on other enabling machines and devices: Secondary | ICD-10-CM | POA: Diagnosis not present

## 2016-11-23 ENCOUNTER — Encounter: Payer: Self-pay | Admitting: Family Medicine

## 2016-11-23 ENCOUNTER — Other Ambulatory Visit: Payer: Self-pay | Admitting: Family Medicine

## 2016-11-23 DIAGNOSIS — G8929 Other chronic pain: Secondary | ICD-10-CM

## 2016-11-23 DIAGNOSIS — M2550 Pain in unspecified joint: Principal | ICD-10-CM

## 2016-11-23 MED ORDER — HYDROCODONE-ACETAMINOPHEN 5-325 MG PO TABS: 1.0000 | ORAL_TABLET | Freq: Four times a day (QID) | ORAL | 0 refills | Status: DC | PRN

## 2016-11-23 NOTE — Telephone Encounter (Signed)
Last OV 09/04/16 Hydrocodone last filled 10/02/16 #120 with 0

## 2016-11-23 NOTE — Telephone Encounter (Signed)
Ok to refill- needs UDS (do not see one within the last year)

## 2016-12-08 DIAGNOSIS — Z9989 Dependence on other enabling machines and devices: Secondary | ICD-10-CM | POA: Diagnosis not present

## 2016-12-08 DIAGNOSIS — G4733 Obstructive sleep apnea (adult) (pediatric): Secondary | ICD-10-CM | POA: Diagnosis not present

## 2016-12-13 ENCOUNTER — Other Ambulatory Visit: Payer: Self-pay

## 2016-12-13 ENCOUNTER — Encounter: Payer: Self-pay | Admitting: Family Medicine

## 2016-12-13 ENCOUNTER — Ambulatory Visit: Payer: BLUE CROSS/BLUE SHIELD | Admitting: Family Medicine

## 2016-12-13 VITALS — BP 128/88 | HR 64 | Temp 98.4°F | Resp 16 | Ht 67.0 in | Wt 334.5 lb

## 2016-12-13 DIAGNOSIS — E785 Hyperlipidemia, unspecified: Secondary | ICD-10-CM | POA: Diagnosis not present

## 2016-12-13 DIAGNOSIS — Z6841 Body Mass Index (BMI) 40.0 and over, adult: Secondary | ICD-10-CM | POA: Diagnosis not present

## 2016-12-13 DIAGNOSIS — E1169 Type 2 diabetes mellitus with other specified complication: Secondary | ICD-10-CM | POA: Diagnosis not present

## 2016-12-13 DIAGNOSIS — I10 Essential (primary) hypertension: Secondary | ICD-10-CM | POA: Diagnosis not present

## 2016-12-13 DIAGNOSIS — E119 Type 2 diabetes mellitus without complications: Secondary | ICD-10-CM | POA: Diagnosis not present

## 2016-12-13 LAB — LIPID PANEL
CHOLESTEROL: 131 mg/dL (ref 0–200)
HDL: 38.3 mg/dL — ABNORMAL LOW (ref >39.00)
LDL CALC: 68 mg/dL (ref 0–99)
NonHDL: 92.42
TRIGLYCERIDES: 120 mg/dL (ref 0.0–149.0)
Total CHOL/HDL Ratio: 3
VLDL: 24 mg/dL (ref 0.0–40.0)

## 2016-12-13 LAB — HEPATIC FUNCTION PANEL
ALBUMIN: 4.3 g/dL (ref 3.5–5.2)
ALK PHOS: 71 U/L (ref 39–117)
ALT: 24 U/L (ref 0–53)
AST: 16 U/L (ref 0–37)
BILIRUBIN DIRECT: 0.1 mg/dL (ref 0.0–0.3)
TOTAL PROTEIN: 6.9 g/dL (ref 6.0–8.3)
Total Bilirubin: 0.7 mg/dL (ref 0.2–1.2)

## 2016-12-13 LAB — CBC WITH DIFFERENTIAL/PLATELET
BASOS ABS: 0 10*3/uL (ref 0.0–0.1)
Basophils Relative: 0.5 % (ref 0.0–3.0)
EOS ABS: 0.2 10*3/uL (ref 0.0–0.7)
Eosinophils Relative: 2.9 % (ref 0.0–5.0)
HCT: 44.4 % (ref 39.0–52.0)
Hemoglobin: 15.2 g/dL (ref 13.0–17.0)
LYMPHS ABS: 1.5 10*3/uL (ref 0.7–4.0)
Lymphocytes Relative: 23.7 % (ref 12.0–46.0)
MCHC: 34.3 g/dL (ref 30.0–36.0)
MCV: 93.4 fl (ref 78.0–100.0)
Monocytes Absolute: 0.6 10*3/uL (ref 0.1–1.0)
Monocytes Relative: 9.9 % (ref 3.0–12.0)
NEUTROS ABS: 4 10*3/uL (ref 1.4–7.7)
NEUTROS PCT: 63 % (ref 43.0–77.0)
PLATELETS: 237 10*3/uL (ref 150.0–400.0)
RBC: 4.76 Mil/uL (ref 4.22–5.81)
RDW: 12.8 % (ref 11.5–15.5)
WBC: 6.4 10*3/uL (ref 4.0–10.5)

## 2016-12-13 LAB — BASIC METABOLIC PANEL
BUN: 17 mg/dL (ref 6–23)
CALCIUM: 9.5 mg/dL (ref 8.4–10.5)
CO2: 24 meq/L (ref 19–32)
Chloride: 105 mEq/L (ref 96–112)
Creatinine, Ser: 0.96 mg/dL (ref 0.40–1.50)
GFR: 88.22 mL/min (ref >60.00)
GLUCOSE: 122 mg/dL — AB (ref 70–99)
POTASSIUM: 4.2 meq/L (ref 3.5–5.1)
SODIUM: 139 meq/L (ref 135–145)

## 2016-12-13 LAB — HEMOGLOBIN A1C: HEMOGLOBIN A1C: 6.3 % (ref 4.6–6.5)

## 2016-12-13 LAB — TSH: TSH: 2.3 u[IU]/mL (ref 0.35–4.50)

## 2016-12-13 NOTE — Progress Notes (Signed)
   Subjective:    Patient ID: Caleb White., male    DOB: 1965/08/23, 51 y.o.   MRN: [id number]  HPI DM- chronic problem, on Januvia, Glipizide daily.  Last A1C 6.8.  On ACE for renal protection.  Due for eye exam.  UTD on foot exam.  Pt has lost another 6 lbs.  Denies symptomatic lows, no numbness/tingling of hands/feet  Hyperlipidemia- on Lipitor 10mg  daily.  Denies abd pain, N/V, myalgias.  HTN- on Lisinopril 2.5mg  daily w/ adequate control.  Denies CP, SOB, HAs, visual changes, edema.   Review of Systems For ROS see HPI     Objective:   Physical Exam  Constitutional: He is oriented to person, place, and time. He appears well-developed and well-nourished. No distress.  Morbidly obese  HENT:  Head: Normocephalic and atraumatic.  Eyes: Conjunctivae and EOM are normal. Pupils are equal, round, and reactive to light.  Neck: Normal range of motion. Neck supple. No thyromegaly present.  Cardiovascular: Normal rate, regular rhythm, normal heart sounds and intact distal pulses.  No murmur heard. Pulmonary/Chest: Effort normal and breath sounds normal. No respiratory distress.  Abdominal: Soft. Bowel sounds are normal. He exhibits no distension.  Musculoskeletal: He exhibits no edema.  Lymphadenopathy:    He has no cervical adenopathy.  Neurological: He is alert and oriented to person, place, and time. No cranial nerve deficit.  Skin: Skin is warm and dry.  Psychiatric: He has a normal mood and affect. His behavior is normal.  Vitals reviewed.         Assessment & Plan:

## 2016-12-13 NOTE — Assessment & Plan Note (Signed)
Chronic problem.  Pt is down another 6 lbs.  Applauded his efforts.  Will continue to follow.

## 2016-12-13 NOTE — Assessment & Plan Note (Signed)
Chronic problem.  Tolerating statin w/o difficulty.  Check labs.  Adjust meds prn  

## 2016-12-13 NOTE — Assessment & Plan Note (Signed)
Chronic problem.  Adequate control today on the low dose Lisinopril that was started for renal protection.  Check labs.  Will continue to follow and adjust meds prn.

## 2016-12-13 NOTE — Patient Instructions (Addendum)
Follow up in 3-4 months to recheck sugars We'll notify you of your lab results and make any changes if needed Continue to work on healthy diet and regular exercise- you're doing great!! SCHEDULE YOUR EYE EXAM!!! Call with any questions or concerns Happy Holidays!!!

## 2016-12-13 NOTE — Assessment & Plan Note (Signed)
Chronic problem.  A1C has been adequately controlled.  Pt continues to work on weight loss.  Applauded his efforts.  Foot exam done today.  On ACE for renal protection.  Overdue for eye exam- encouraged pt to schedule.  Check labs.  Adjust meds prn

## 2016-12-14 ENCOUNTER — Encounter: Payer: Self-pay | Admitting: General Practice

## 2017-01-04 ENCOUNTER — Encounter: Payer: Self-pay | Admitting: Family Medicine

## 2017-01-04 DIAGNOSIS — G8929 Other chronic pain: Secondary | ICD-10-CM

## 2017-01-04 DIAGNOSIS — M2550 Pain in unspecified joint: Principal | ICD-10-CM

## 2017-01-04 NOTE — Telephone Encounter (Signed)
Last OV 12/13/16 Hydrocodone last filled 11/23/16 #120 with 0   CSC on file, 08/06/16 UDS low risk

## 2017-01-05 MED ORDER — HYDROCODONE-ACETAMINOPHEN 5-325 MG PO TABS: 1.0000 | ORAL_TABLET | Freq: Four times a day (QID) | ORAL | 0 refills | Status: DC | PRN

## 2017-01-05 NOTE — Telephone Encounter (Signed)
Reviewed CSC on file.  Last UDS 01/2016. Database reviewed today. No red flags. He is on multiple controlled medications from PCP. A copy will be scanned into patient's chart.  I am willing to give 30 tablets in PCP absence.  Once he is out, he can get regular fill from Dr. Birdie Riddle.  Rx sent to his pharmacy using [facility].

## 2017-01-07 DIAGNOSIS — G4733 Obstructive sleep apnea (adult) (pediatric): Secondary | ICD-10-CM | POA: Diagnosis not present

## 2017-01-07 DIAGNOSIS — Z9989 Dependence on other enabling machines and devices: Secondary | ICD-10-CM | POA: Diagnosis not present

## 2017-01-14 ENCOUNTER — Other Ambulatory Visit: Payer: Self-pay | Admitting: Family Medicine

## 2017-01-16 HISTORY — PX: VASECTOMY: SHX75

## 2017-01-17 ENCOUNTER — Other Ambulatory Visit: Payer: Self-pay | Admitting: General Practice

## 2017-01-17 DIAGNOSIS — M2550 Pain in unspecified joint: Principal | ICD-10-CM

## 2017-01-17 DIAGNOSIS — G8929 Other chronic pain: Secondary | ICD-10-CM

## 2017-01-17 MED ORDER — HYDROCODONE-ACETAMINOPHEN 5-325 MG PO TABS: 1.0000 | ORAL_TABLET | Freq: Four times a day (QID) | ORAL | 0 refills | Status: DC | PRN

## 2017-01-17 NOTE — Telephone Encounter (Signed)
Medication filled to pharmacy as requested.  Pt made aware.  

## 2017-01-17 NOTE — Telephone Encounter (Signed)
Could you please send this in again for pt. The rx was listed as do not fill until 05/31/2017.

## 2017-02-01 ENCOUNTER — Other Ambulatory Visit: Payer: Self-pay | Admitting: Family Medicine

## 2017-02-01 NOTE — Telephone Encounter (Signed)
Medication filled to pharmacy as requested.   

## 2017-02-01 NOTE — Telephone Encounter (Signed)
Last OV 12/13/16 Alprazolam last filled 08/02/16 #60 with 1

## 2017-02-04 ENCOUNTER — Other Ambulatory Visit: Payer: Self-pay | Admitting: Family Medicine

## 2017-02-05 NOTE — Telephone Encounter (Signed)
Medication filled to pharmacy as requested.   

## 2017-02-05 NOTE — Telephone Encounter (Signed)
Last OV 12/13/16 Testosterone last filled 10/25/16 5mL with 0

## 2017-02-07 DIAGNOSIS — G4733 Obstructive sleep apnea (adult) (pediatric): Secondary | ICD-10-CM | POA: Diagnosis not present

## 2017-02-07 DIAGNOSIS — Z9989 Dependence on other enabling machines and devices: Secondary | ICD-10-CM | POA: Diagnosis not present

## 2017-02-15 ENCOUNTER — Encounter: Payer: Self-pay | Admitting: Family Medicine

## 2017-02-25 ENCOUNTER — Other Ambulatory Visit: Payer: Self-pay | Admitting: Family Medicine

## 2017-02-26 NOTE — Telephone Encounter (Signed)
Last OV 12/13/16 lunesta last filled 09/26/16 #30 with 3

## 2017-03-07 ENCOUNTER — Encounter: Payer: Self-pay | Admitting: Family Medicine

## 2017-03-07 ENCOUNTER — Other Ambulatory Visit: Payer: Self-pay

## 2017-03-07 ENCOUNTER — Ambulatory Visit: Payer: BLUE CROSS/BLUE SHIELD | Admitting: Family Medicine

## 2017-03-07 VITALS — BP 123/83 | HR 60 | Temp 97.9°F | Resp 17 | Ht 67.0 in | Wt 352.2 lb

## 2017-03-07 DIAGNOSIS — L600 Ingrowing nail: Secondary | ICD-10-CM | POA: Diagnosis not present

## 2017-03-07 DIAGNOSIS — B9689 Other specified bacterial agents as the cause of diseases classified elsewhere: Secondary | ICD-10-CM | POA: Diagnosis not present

## 2017-03-07 DIAGNOSIS — G4700 Insomnia, unspecified: Secondary | ICD-10-CM | POA: Diagnosis not present

## 2017-03-07 DIAGNOSIS — J329 Chronic sinusitis, unspecified: Secondary | ICD-10-CM | POA: Diagnosis not present

## 2017-03-07 MED ORDER — DOXYCYCLINE HYCLATE 100 MG PO TABS: 100.0000 mg | ORAL_TABLET | Freq: Two times a day (BID) | ORAL | 0 refills | Status: DC

## 2017-03-07 MED ORDER — ESZOPICLONE 3 MG PO TABS: 3.0000 mg | ORAL_TABLET | Freq: Every day | ORAL | 3 refills | Status: DC

## 2017-03-07 NOTE — Progress Notes (Signed)
   Subjective:    Patient ID: Caleb Quam., male    DOB: 10-14-1965, 52 y.o.   MRN: [id number]  HPI Insomnia- 'I don't ever sleep well'.  Taking Lunesta 2mg  nightly and reports getting 2-4 hrs nightly.  Wearing CPAP nightly.  URI- sxs started 'a few weeks ago' w/ laryngitis.  + PND.  'I haven't felt like i'm sick'.  No fevers.  Taking Flonase and Claritin 'sporadically'.  L great toe- 'I think my nail is infected'.  sxs started 'a couple of weeks ago'.  Has been applying Neosporin w/o improvement.  + TTP.  No drainage from toe   Review of Systems For ROS see HPI     Objective:   Physical Exam  Constitutional: He is oriented to person, place, and time. He appears well-developed and well-nourished. No distress.  Morbidly obese  HENT:  Head: Normocephalic and atraumatic.  Right Ear: Tympanic membrane normal.  Left Ear: Tympanic membrane normal.  Nose: Mucosal edema and rhinorrhea present. Right sinus exhibits maxillary sinus tenderness and frontal sinus tenderness. Left sinus exhibits maxillary sinus tenderness and frontal sinus tenderness.  Mouth/Throat: Mucous membranes are normal. Oropharyngeal exudate and posterior oropharyngeal erythema present. No posterior oropharyngeal edema.  + PND  Eyes: Conjunctivae and EOM are normal. Pupils are equal, round, and reactive to light.  Neck: Normal range of motion. Neck supple.  Cardiovascular: Normal rate, regular rhythm and normal heart sounds.  Pulmonary/Chest: Effort normal and breath sounds normal. No respiratory distress. He has no wheezes.  + hacking cough  Lymphadenopathy:    He has no cervical adenopathy.  Neurological: He is alert and oriented to person, place, and time.  Skin: Skin is warm and dry.  L great toe w/ ingrown lateral nail w/ infection  Vitals reviewed.         Assessment & Plan:  Sinusitis- new.  Pt's sxs and PE consistent w/ infxn.  Start abx that will cover both sinuses and infected toenail.  Start  Doxy.  Reviewed supportive care and red flags that should prompt return.  Infected ingrown toenail- new.  Refer to podiatry and start Doxy.  Pt expressed understanding and is in agreement w/ plan.

## 2017-03-07 NOTE — Assessment & Plan Note (Signed)
Chronic problem.  No improvement w/ Lunesta 2mg .  Will increase to 3mg  nightly and monitor for improvement as pt is intolerant to Ambien.  Pt expressed understanding and is in agreement w/ plan.

## 2017-03-07 NOTE — Patient Instructions (Signed)
Follow up as needed or as scheduled We'll call you with your podiatry appt START the Doxycycline twice daily- w/ food- for sinus infection and ingrown toenail Soak your toe in Epsom salt soaks Change the Lunesta to 3mg  nightly Call with any questions or concerns Hang in there!!!

## 2017-03-10 DIAGNOSIS — G4733 Obstructive sleep apnea (adult) (pediatric): Secondary | ICD-10-CM | POA: Diagnosis not present

## 2017-03-10 DIAGNOSIS — Z9989 Dependence on other enabling machines and devices: Secondary | ICD-10-CM | POA: Diagnosis not present

## 2017-03-14 ENCOUNTER — Encounter: Payer: Self-pay | Admitting: Family Medicine

## 2017-03-14 DIAGNOSIS — G8929 Other chronic pain: Secondary | ICD-10-CM

## 2017-03-14 DIAGNOSIS — M2550 Pain in unspecified joint: Principal | ICD-10-CM

## 2017-03-14 MED ORDER — HYDROCODONE-ACETAMINOPHEN 5-325 MG PO TABS: 1.0000 | ORAL_TABLET | Freq: Four times a day (QID) | ORAL | 0 refills | Status: DC | PRN

## 2017-03-14 NOTE — Telephone Encounter (Signed)
Last OV 03/07/17 Hydrocodone last filled 01/17/17 #120 with 0

## 2017-03-16 DIAGNOSIS — G4733 Obstructive sleep apnea (adult) (pediatric): Secondary | ICD-10-CM | POA: Diagnosis not present

## 2017-03-16 DIAGNOSIS — Z9989 Dependence on other enabling machines and devices: Secondary | ICD-10-CM | POA: Diagnosis not present

## 2017-03-19 ENCOUNTER — Ambulatory Visit: Payer: BLUE CROSS/BLUE SHIELD | Admitting: Family Medicine

## 2017-03-19 DIAGNOSIS — Z0289 Encounter for other administrative examinations: Secondary | ICD-10-CM

## 2017-04-04 DIAGNOSIS — L600 Ingrowing nail: Secondary | ICD-10-CM | POA: Diagnosis not present

## 2017-04-04 DIAGNOSIS — L03032 Cellulitis of left toe: Secondary | ICD-10-CM | POA: Diagnosis not present

## 2017-04-04 DIAGNOSIS — I10 Essential (primary) hypertension: Secondary | ICD-10-CM | POA: Diagnosis not present

## 2017-04-04 DIAGNOSIS — E1142 Type 2 diabetes mellitus with diabetic polyneuropathy: Secondary | ICD-10-CM | POA: Diagnosis not present

## 2017-04-07 ENCOUNTER — Other Ambulatory Visit: Payer: Self-pay | Admitting: Family Medicine

## 2017-04-07 DIAGNOSIS — G4733 Obstructive sleep apnea (adult) (pediatric): Secondary | ICD-10-CM | POA: Diagnosis not present

## 2017-04-07 DIAGNOSIS — Z9989 Dependence on other enabling machines and devices: Secondary | ICD-10-CM | POA: Diagnosis not present

## 2017-04-09 NOTE — Telephone Encounter (Signed)
Last OV 03/07/17 Testosterone last filled 02/05/17 38mL with 0 refills

## 2017-04-11 ENCOUNTER — Other Ambulatory Visit: Payer: Self-pay

## 2017-04-11 ENCOUNTER — Encounter: Payer: Self-pay | Admitting: Family Medicine

## 2017-04-11 ENCOUNTER — Ambulatory Visit: Payer: BLUE CROSS/BLUE SHIELD | Admitting: Family Medicine

## 2017-04-11 VITALS — BP 118/70 | HR 59 | Temp 98.0°F | Resp 16 | Ht 67.0 in | Wt 353.0 lb

## 2017-04-11 DIAGNOSIS — E785 Hyperlipidemia, unspecified: Secondary | ICD-10-CM | POA: Diagnosis not present

## 2017-04-11 DIAGNOSIS — Z111 Encounter for screening for respiratory tuberculosis: Secondary | ICD-10-CM

## 2017-04-11 DIAGNOSIS — Z6841 Body Mass Index (BMI) 40.0 and over, adult: Secondary | ICD-10-CM

## 2017-04-11 DIAGNOSIS — E1169 Type 2 diabetes mellitus with other specified complication: Secondary | ICD-10-CM

## 2017-04-11 DIAGNOSIS — E119 Type 2 diabetes mellitus without complications: Secondary | ICD-10-CM

## 2017-04-11 LAB — BASIC METABOLIC PANEL
BUN: 10 mg/dL (ref 6–23)
CALCIUM: 9.3 mg/dL (ref 8.4–10.5)
CO2: 24 meq/L (ref 19–32)
CREATININE: 0.91 mg/dL (ref 0.40–1.50)
Chloride: 102 mEq/L (ref 96–112)
GFR: 93.72 mL/min (ref >60.00)
GLUCOSE: 166 mg/dL — AB (ref 70–99)
Potassium: 3.8 mEq/L (ref 3.5–5.1)
Sodium: 137 mEq/L (ref 135–145)

## 2017-04-11 LAB — HEMOGLOBIN A1C: HEMOGLOBIN A1C: 7.2 % — AB (ref 4.6–6.5)

## 2017-04-11 LAB — TSH: TSH: 2.34 u[IU]/mL (ref 0.35–4.50)

## 2017-04-11 LAB — CBC WITH DIFFERENTIAL/PLATELET
BASOS PCT: 0.6 % (ref 0.0–3.0)
Basophils Absolute: 0 10*3/uL (ref 0.0–0.1)
EOS PCT: 4.4 % (ref 0.0–5.0)
Eosinophils Absolute: 0.3 10*3/uL (ref 0.0–0.7)
HEMATOCRIT: 45.3 % (ref 39.0–52.0)
HEMOGLOBIN: 15.8 g/dL (ref 13.0–17.0)
LYMPHS PCT: 27.9 % (ref 12.0–46.0)
Lymphs Abs: 1.7 10*3/uL (ref 0.7–4.0)
MCHC: 34.9 g/dL (ref 30.0–36.0)
MCV: 91.9 fl (ref 78.0–100.0)
MONOS PCT: 8.7 % (ref 3.0–12.0)
Monocytes Absolute: 0.5 10*3/uL (ref 0.1–1.0)
NEUTROS ABS: 3.5 10*3/uL (ref 1.4–7.7)
Neutrophils Relative %: 58.4 % (ref 43.0–77.0)
PLATELETS: 217 10*3/uL (ref 150.0–400.0)
RBC: 4.93 Mil/uL (ref 4.22–5.81)
RDW: 13 % (ref 11.5–15.5)
WBC: 6.1 10*3/uL (ref 4.0–10.5)

## 2017-04-11 LAB — HEPATIC FUNCTION PANEL
ALT: 28 U/L (ref 0–53)
AST: 18 U/L (ref 0–37)
Albumin: 4 g/dL (ref 3.5–5.2)
Alkaline Phosphatase: 64 U/L (ref 39–117)
BILIRUBIN DIRECT: 0.1 mg/dL (ref 0.0–0.3)
BILIRUBIN TOTAL: 0.4 mg/dL (ref 0.2–1.2)
Total Protein: 6.9 g/dL (ref 6.0–8.3)

## 2017-04-11 LAB — LIPID PANEL
CHOLESTEROL: 174 mg/dL (ref 0–200)
HDL: 37 mg/dL — ABNORMAL LOW (ref >39.00)
Total CHOL/HDL Ratio: 5
Triglycerides: 462 mg/dL — ABNORMAL HIGH (ref 0.0–149.0)

## 2017-04-11 LAB — LDL CHOLESTEROL, DIRECT: Direct LDL: 81 mg/dL

## 2017-04-11 MED ORDER — GLIPIZIDE 10 MG PO TABS: 10.0000 mg | ORAL_TABLET | Freq: Two times a day (BID) | ORAL | 1 refills | Status: DC

## 2017-04-11 MED ORDER — BUPROPION HCL ER (XL) 150 MG PO TB24: 150.0000 mg | ORAL_TABLET | Freq: Every day | ORAL | 1 refills | Status: DC

## 2017-04-11 MED ORDER — LISINOPRIL 2.5 MG PO TABS: 2.5000 mg | ORAL_TABLET | Freq: Every day | ORAL | 1 refills | Status: DC

## 2017-04-11 MED ORDER — ATORVASTATIN CALCIUM 10 MG PO TABS: 10.0000 mg | ORAL_TABLET | Freq: Every day | ORAL | 1 refills | Status: DC

## 2017-04-11 MED ORDER — ESZOPICLONE 3 MG PO TABS: 3.0000 mg | ORAL_TABLET | Freq: Every day | ORAL | 1 refills | Status: DC

## 2017-04-11 MED ORDER — TAMSULOSIN HCL 0.4 MG PO CAPS: 0.4000 mg | ORAL_CAPSULE | Freq: Every day | ORAL | 1 refills | Status: DC

## 2017-04-11 MED ORDER — FLUOXETINE HCL 20 MG PO TABS: 20.0000 mg | ORAL_TABLET | Freq: Every day | ORAL | 1 refills | Status: DC

## 2017-04-11 MED ORDER — MELOXICAM 15 MG PO TABS: 15.0000 mg | ORAL_TABLET | Freq: Every day | ORAL | 1 refills | Status: DC

## 2017-04-11 NOTE — Patient Instructions (Signed)
Follow up in 3-4 months to recheck sugars We'll notify you of your lab results and make any changes if needed Continue to work on healthy diet and regular exercise- you can do it!  And it will help with the stress level Please call and schedule your eye exam- try to do this before you lose your insurance Call with any questions or concerns Hang in there!!!

## 2017-04-11 NOTE — Progress Notes (Signed)
   Subjective:    Patient ID: Caleb Quam., male    DOB: 06-30-1965, 52 y.o.   MRN: [id number]  HPI DM- chronic problem, on Glipizide 10mg .  On ACE for renal protection.  UTD on foot exam (podiatry).  Overdue for eye exam- is about to lose health insurance.  No CP, SOB, HAs, visual changes, edema.  No numbness/tingling of hands/feet.  Denies symptomatic lows.  Morbid Obesity- pt has gained another 7 lbs.  Not exercising regularly.  Not currently following a particular diet.  Hyperlipidemia- chronic problem, on Lipitor daily.  Denies abd pain, N/V.   Review of Systems For ROS see HPI     Objective:   Physical Exam  Constitutional: He is oriented to person, place, and time. He appears well-developed and well-nourished. No distress.  Morbidly obese  HENT:  Head: Normocephalic and atraumatic.  Eyes: Pupils are equal, round, and reactive to light. Conjunctivae and EOM are normal.  Neck: Normal range of motion. Neck supple. No thyromegaly present.  Cardiovascular: Normal rate, regular rhythm, normal heart sounds and intact distal pulses.  No murmur heard. Pulmonary/Chest: Effort normal and breath sounds normal. No respiratory distress.  Abdominal: Soft. Bowel sounds are normal. He exhibits no distension.  Musculoskeletal: He exhibits no edema.  Lymphadenopathy:    He has no cervical adenopathy.  Neurological: He is alert and oriented to person, place, and time. No cranial nerve deficit.  Skin: Skin is warm and dry.  Psychiatric: He has a normal mood and affect. His behavior is normal.  Vitals reviewed.         Assessment & Plan:

## 2017-04-11 NOTE — Assessment & Plan Note (Signed)
Chronic problem.  On Glipizide twice daily.  UTD on foot exam.  On ACE for renal protection.  Overdue on eye exam- pt encouraged to schedule.  Check labs.  Adjust meds prn

## 2017-04-11 NOTE — Assessment & Plan Note (Signed)
Chronic problem.  Tolerating statin w/o difficulty.  Stressed need for healthy diet and regular exercise as weight loss will improve this issue.  Check labs.  Adjust meds prn

## 2017-04-11 NOTE — Assessment & Plan Note (Signed)
Deteriorated.  Pt has gained another 7 lbs.  Stressed need for healthy diet and regular exercise.  Will continue to monitor.

## 2017-04-12 ENCOUNTER — Other Ambulatory Visit: Payer: Self-pay | Admitting: Emergency Medicine

## 2017-04-12 ENCOUNTER — Encounter: Payer: Self-pay | Admitting: General Practice

## 2017-04-12 LAB — QUANTIFERON-TB GOLD PLUS
MITOGEN-NIL: 7.19 [IU]/mL
NIL: 0.03 IU/mL
QuantiFERON-TB Gold Plus: NEGATIVE
TB1-NIL: <0 IU/mL
TB2-NIL: <0 IU/mL

## 2017-04-12 MED ORDER — FENOFIBRATE 160 MG PO TABS: 160.0000 mg | ORAL_TABLET | Freq: Every day | ORAL | 1 refills | Status: DC

## 2017-04-20 ENCOUNTER — Encounter: Payer: Self-pay | Admitting: Family Medicine

## 2017-04-20 DIAGNOSIS — M2550 Pain in unspecified joint: Principal | ICD-10-CM

## 2017-04-20 DIAGNOSIS — G8929 Other chronic pain: Secondary | ICD-10-CM

## 2017-04-20 MED ORDER — HYDROCODONE-ACETAMINOPHEN 5-325 MG PO TABS: 1.0000 | ORAL_TABLET | Freq: Four times a day (QID) | ORAL | 0 refills | Status: DC | PRN

## 2017-05-08 DIAGNOSIS — Z9989 Dependence on other enabling machines and devices: Secondary | ICD-10-CM | POA: Diagnosis not present

## 2017-05-08 DIAGNOSIS — G4733 Obstructive sleep apnea (adult) (pediatric): Secondary | ICD-10-CM | POA: Diagnosis not present

## 2017-05-24 ENCOUNTER — Encounter: Payer: Self-pay | Admitting: Family Medicine

## 2017-05-24 DIAGNOSIS — G8929 Other chronic pain: Secondary | ICD-10-CM

## 2017-05-24 DIAGNOSIS — M2550 Pain in unspecified joint: Principal | ICD-10-CM

## 2017-05-25 MED ORDER — TRAZODONE HCL 50 MG PO TABS: 25.0000 mg | ORAL_TABLET | Freq: Every evening | ORAL | 3 refills | Status: DC | PRN

## 2017-05-25 MED ORDER — HYDROCODONE-ACETAMINOPHEN 5-325 MG PO TABS: 1.0000 | ORAL_TABLET | Freq: Four times a day (QID) | ORAL | 0 refills | Status: DC | PRN

## 2017-05-25 NOTE — Telephone Encounter (Signed)
Last OV 04/11/17 Hydrocodone last filled 04/20/17 #120 with 0

## 2017-06-07 DIAGNOSIS — Z9989 Dependence on other enabling machines and devices: Secondary | ICD-10-CM | POA: Diagnosis not present

## 2017-06-07 DIAGNOSIS — G4733 Obstructive sleep apnea (adult) (pediatric): Secondary | ICD-10-CM | POA: Diagnosis not present

## 2017-06-19 DIAGNOSIS — G4733 Obstructive sleep apnea (adult) (pediatric): Secondary | ICD-10-CM | POA: Diagnosis not present

## 2017-06-19 DIAGNOSIS — Z9989 Dependence on other enabling machines and devices: Secondary | ICD-10-CM | POA: Diagnosis not present

## 2017-06-25 ENCOUNTER — Encounter: Payer: Self-pay | Admitting: Family Medicine

## 2017-06-25 DIAGNOSIS — M2550 Pain in unspecified joint: Principal | ICD-10-CM

## 2017-06-25 DIAGNOSIS — G8929 Other chronic pain: Secondary | ICD-10-CM

## 2017-06-25 MED ORDER — HYDROCODONE-ACETAMINOPHEN 5-325 MG PO TABS: 1.0000 | ORAL_TABLET | Freq: Four times a day (QID) | ORAL | 0 refills | Status: DC | PRN

## 2017-07-25 ENCOUNTER — Other Ambulatory Visit: Payer: Self-pay | Admitting: Family Medicine

## 2017-07-25 ENCOUNTER — Encounter: Payer: Self-pay | Admitting: Family Medicine

## 2017-07-25 DIAGNOSIS — G8929 Other chronic pain: Secondary | ICD-10-CM

## 2017-07-25 DIAGNOSIS — M2550 Pain in unspecified joint: Principal | ICD-10-CM

## 2017-07-26 MED ORDER — HYDROCODONE-ACETAMINOPHEN 5-325 MG PO TABS: 1.0000 | ORAL_TABLET | Freq: Four times a day (QID) | ORAL | 0 refills | Status: DC | PRN

## 2017-07-26 NOTE — Telephone Encounter (Signed)
Last OV 04/11/17 Testosterone last filled 04/09/17 10 mL with 0

## 2017-07-26 NOTE — Telephone Encounter (Signed)
Last OV 04/11/17 Hydrocodone last filled 06/25/17 #120 with 0

## 2017-08-02 ENCOUNTER — Ambulatory Visit: Payer: BLUE CROSS/BLUE SHIELD | Admitting: Family Medicine

## 2017-08-02 ENCOUNTER — Other Ambulatory Visit: Payer: Self-pay

## 2017-08-02 ENCOUNTER — Encounter: Payer: Self-pay | Admitting: Family Medicine

## 2017-08-02 VITALS — BP 118/72 | HR 84 | Temp 98.6°F | Resp 16 | Ht 67.0 in | Wt 349.5 lb

## 2017-08-02 DIAGNOSIS — Z3009 Encounter for other general counseling and advice on contraception: Secondary | ICD-10-CM

## 2017-08-02 DIAGNOSIS — F331 Major depressive disorder, recurrent, moderate: Secondary | ICD-10-CM | POA: Diagnosis not present

## 2017-08-02 DIAGNOSIS — Z6841 Body Mass Index (BMI) 40.0 and over, adult: Secondary | ICD-10-CM

## 2017-08-02 DIAGNOSIS — E119 Type 2 diabetes mellitus without complications: Secondary | ICD-10-CM

## 2017-08-02 NOTE — Patient Instructions (Signed)
Schedule your complete physical in 3-4 months We'll notify you of your lab results and make any changes if needed SCHEDULE your eye exam and have them send me a copy Get back on track w/ healthy diet and regular exercise- you can do it! Call with any questions or concerns Hang in there!  You can do this!

## 2017-08-02 NOTE — Assessment & Plan Note (Signed)
Deteriorated.  Pt has gained another 4 lbs.  Stressed need for healthy diet and regular exercise.  Will follow.

## 2017-08-02 NOTE — Progress Notes (Signed)
   Subjective:    Patient ID: Caleb White., male    DOB: 1965-10-04, 52 y.o.   MRN: [id number]  HPI DM- chronic problem.  On Glipizide 10mg  daily.  Intolerant to Metformin due to GI issues.  On ACE for renal protection.  Due for eye exam.  UTD on foot exam.  Pt has gained 4 lbs since last visit.  No regular exercise.  No CP, SOB, HAs, visual changes, edema, abd pain, N/V.  Denies numbness/tingling of hands/feet.  Denies symptomatic lows.  Depression- chronic problem, on Prozac 20mg , Alprazolam 0.5mg .  Cat of 58 yrs died, brother pilfered money, continues to have family issues.  Pt is realizing he will likely need to cut ties w/ his family- which he is coming to peace with.  Obesity- pt has gained 4 lbs since last visit.  Admits to being 'worthless' for the last month.  No exercise, bad food choices, 'lots of alcohol'.   Review of Systems For ROS see HPI     Objective:   Physical Exam  Constitutional: He is oriented to person, place, and time. He appears well-developed and well-nourished. No distress.  obese  HENT:  Head: Normocephalic and atraumatic.  Eyes: Pupils are equal, round, and reactive to light. Conjunctivae and EOM are normal.  Neck: Normal range of motion. Neck supple. No thyromegaly present.  Cardiovascular: Normal rate, regular rhythm, normal heart sounds and intact distal pulses.  No murmur heard. Pulmonary/Chest: Effort normal and breath sounds normal. No respiratory distress.  Abdominal: Soft. Bowel sounds are normal. He exhibits no distension.  Musculoskeletal: He exhibits no edema.  Lymphadenopathy:    He has no cervical adenopathy.  Neurological: He is alert and oriented to person, place, and time. No cranial nerve deficit.  Skin: Skin is warm and dry.  Psychiatric: He has a normal mood and affect. His behavior is normal.  Vitals reviewed.         Assessment & Plan:

## 2017-08-02 NOTE — Assessment & Plan Note (Signed)
Chronic problem.  Tolerating Glipizide w/o difficulty.  Admits to going off the rails for the last month- not taking meds, eating poorly, no exercise, increased alcohol.  Stressed the need for him to do better- on all fronts.  Pt to schedule eye exam as he is overdue.  Check labs.  Adjust tx plan based on results

## 2017-08-02 NOTE — Assessment & Plan Note (Signed)
Ongoing issue for pt.  He feels that he has come to terms w/ some hard decisions and that after some soul searching for the last month he is ready to turn a corner.  Not interested in increasing medication at this time.  Will continue to follow.

## 2017-08-03 ENCOUNTER — Encounter: Payer: Self-pay | Admitting: General Practice

## 2017-08-03 LAB — BASIC METABOLIC PANEL
BUN: 18 mg/dL (ref 6–23)
CHLORIDE: 103 meq/L (ref 96–112)
CO2: 22 mEq/L (ref 19–32)
CREATININE: 1.02 mg/dL (ref 0.40–1.50)
Calcium: 9.3 mg/dL (ref 8.4–10.5)
GFR: 82.05 mL/min (ref >60.00)
GLUCOSE: 249 mg/dL — AB (ref 70–99)
POTASSIUM: 3.9 meq/L (ref 3.5–5.1)
Sodium: 135 mEq/L (ref 135–145)

## 2017-08-03 LAB — HEMOGLOBIN A1C: Hgb A1c MFr Bld: 8.5 % — ABNORMAL HIGH (ref 4.6–6.5)

## 2017-08-25 ENCOUNTER — Encounter: Payer: Self-pay | Admitting: Family Medicine

## 2017-08-25 DIAGNOSIS — G8929 Other chronic pain: Secondary | ICD-10-CM

## 2017-08-25 DIAGNOSIS — M2550 Pain in unspecified joint: Principal | ICD-10-CM

## 2017-08-27 ENCOUNTER — Encounter: Payer: Self-pay | Admitting: Family Medicine

## 2017-08-27 MED ORDER — HYDROCODONE-ACETAMINOPHEN 5-325 MG PO TABS: 1.0000 | ORAL_TABLET | Freq: Four times a day (QID) | ORAL | 0 refills | Status: DC | PRN

## 2017-08-27 NOTE — Telephone Encounter (Signed)
Last OV 08/02/17 Hydrocodone last filled 07/26/17 #120 with 0

## 2017-09-03 ENCOUNTER — Encounter (HOSPITAL_COMMUNITY): Payer: Self-pay | Admitting: Emergency Medicine

## 2017-09-03 ENCOUNTER — Emergency Department (HOSPITAL_COMMUNITY)
Admission: EM | Admit: 2017-09-03 | Discharge: 2017-09-03 | Disposition: A | Discharge status: Home / Self Care | Payer: BLUE CROSS/BLUE SHIELD | Attending: Emergency Medicine | Admitting: Emergency Medicine

## 2017-09-03 ENCOUNTER — Emergency Department (HOSPITAL_COMMUNITY): Payer: BLUE CROSS/BLUE SHIELD

## 2017-09-03 ENCOUNTER — Other Ambulatory Visit: Payer: Self-pay

## 2017-09-03 DIAGNOSIS — E119 Type 2 diabetes mellitus without complications: Secondary | ICD-10-CM | POA: Diagnosis not present

## 2017-09-03 DIAGNOSIS — Z79899 Other long term (current) drug therapy: Secondary | ICD-10-CM | POA: Insufficient documentation

## 2017-09-03 DIAGNOSIS — Z87891 Personal history of nicotine dependence: Secondary | ICD-10-CM | POA: Insufficient documentation

## 2017-09-03 DIAGNOSIS — R197 Diarrhea, unspecified: Secondary | ICD-10-CM | POA: Diagnosis not present

## 2017-09-03 DIAGNOSIS — R079 Chest pain, unspecified: Secondary | ICD-10-CM

## 2017-09-03 DIAGNOSIS — R0789 Other chest pain: Secondary | ICD-10-CM | POA: Diagnosis not present

## 2017-09-03 DIAGNOSIS — I10 Essential (primary) hypertension: Secondary | ICD-10-CM | POA: Insufficient documentation

## 2017-09-03 DIAGNOSIS — R110 Nausea: Secondary | ICD-10-CM | POA: Diagnosis not present

## 2017-09-03 LAB — BASIC METABOLIC PANEL
ANION GAP: 11 (ref 5–15)
BUN: 17 mg/dL (ref 6–20)
CO2: 20 mmol/L — ABNORMAL LOW (ref 22–32)
CREATININE: 1.24 mg/dL (ref 0.61–1.24)
Calcium: 9.4 mg/dL (ref 8.9–10.3)
Chloride: 104 mmol/L (ref 98–111)
GFR calc non Af Amer: >60 mL/min (ref >60)
Glucose, Bld: 366 mg/dL — ABNORMAL HIGH (ref 70–99)
POTASSIUM: 4.1 mmol/L (ref 3.5–5.1)
SODIUM: 135 mmol/L (ref 135–145)

## 2017-09-03 LAB — I-STAT TROPONIN, ED
TROPONIN I, POC: 0 ng/mL (ref 0.00–0.08)
TROPONIN I, POC: 0 ng/mL (ref 0.00–0.08)

## 2017-09-03 LAB — CBC
HCT: 44.9 % (ref 39.0–52.0)
Hemoglobin: 15.4 g/dL (ref 13.0–17.0)
MCH: 31.6 pg (ref 26.0–34.0)
MCHC: 34.3 g/dL (ref 30.0–36.0)
MCV: 92 fL (ref 78.0–100.0)
PLATELETS: 243 10*3/uL (ref 150–400)
RBC: 4.88 MIL/uL (ref 4.22–5.81)
RDW: 11.9 % (ref 11.5–15.5)
WBC: 7.6 10*3/uL (ref 4.0–10.5)

## 2017-09-03 LAB — CBG MONITORING, ED: Glucose-Capillary: 236 mg/dL — ABNORMAL HIGH (ref 70–99)

## 2017-09-03 NOTE — ED Notes (Signed)
Chest Pain returned while patient waiting, pt re-evaluated, VS rechecked

## 2017-09-03 NOTE — ED Triage Notes (Signed)
Pt to ER for evaluation of sharp chest pain, states took two nitro with total relief. States is prescribed nitro for cp, although patient does not have a cardiac hx. VSS. Pt in NAD.

## 2017-09-03 NOTE — ED Provider Notes (Signed)
[facility] EMERGENCY DEPARTMENT Provider Note   CSN: [id number] Arrival date & time: 09/03/17  1614     History   Chief Complaint Chief Complaint  Patient presents with  . Chest Pain    HPI Caleb Olver. is a 52 y.o. male.  HPI Patient presents with chest pain.  Dull in his mid chest.  Began today after being exposed to gluten.  States he has severe gluten allergy.  States he developed diarrhea some chest pain and nausea.  States he does get chest pain sometimes with exposures.  Had been doing somewhat better after taking some nitroglycerin.  Does not have a known coronary artery disease history but has family history.  States his primary care doctor gave him nitroglycerin just to be safe.  Has not had any chest pains over the last week.  Is no problem with exertion over the last week.  Had a separate episode while waiting in the waiting room for his room for today.  Feels much better now.  Has some chronic swelling in his legs that is unchanged.  No fevers.  No cough. Past Medical History:  Diagnosis Date  . Arthritis    per patient  . Back injury    per patient, history of ruptured or torn disk   . Depression   . Diabetes mellitus without complication ([place])   . Foot deformity   . Gluten intolerance    self diagnosed, no h/o biopsy or blood testing  . Hyperlipidemia   . Low testosterone   . Morbid obesity with BMI of 50.0-59.9, adult ([place])   . OSA on CPAP   . Shortness of breath   . Sleep apnea    on CPAP  . Spinal stenosis     Patient Active Problem List   Diagnosis Date Noted  . Insomnia 03/07/2017  . Hyperlipidemia associated with type 2 diabetes mellitus ([place]) 12/13/2016  . HTN (hypertension) 12/13/2016  . Physical exam 06/01/2016  . BPH (benign prostatic hyperplasia) 09/24/2015  . Low testosterone 09/24/2015  . Depression 09/24/2015  . Chronic joint pain 09/24/2015  . Chest pain at rest 11/22/2012  . Diabetes mellitus, type 2 ([place])  11/22/2012  . Morbid obesity with BMI of 50.0-59.9, adult ([place]) 11/22/2012  . Sleep apnea 11/22/2012    Past Surgical History:  Procedure Laterality Date  . BLADDER SURGERY     at 52 years old, patient unaware of details        Home Medications    Prior to Admission medications   Medication Sig Start Date End Date Taking? Authorizing Provider  ALPRAZolam (XANAX) 0.5 MG tablet TAKE 1 TABLET BY MOUTH TWICE A DAY AS NEEDED FOR ANXIETY Patient taking differently: Take 1 mg by mouth 2 (two) times daily as needed for anxiety.  02/01/17  Yes [name], MD  atorvastatin (LIPITOR) 10 MG tablet Take 1 tablet (10 mg total) by mouth daily. 04/11/17  Yes [name], MD  buPROPion (WELLBUTRIN XL) 150 MG 24 hr tablet Take 1 tablet (150 mg total) by mouth daily. 04/11/17  Yes [name], MD  eszopiclone (LUNESTA) 2 MG TABS tablet Take 2 mg by mouth daily as needed for sleep. 02/07/16  Yes [provider]  fenofibrate 160 MG tablet Take 1 tablet (160 mg total) by mouth daily. 04/12/17  Yes [name], MD  FLUoxetine (PROZAC) 20 MG tablet Take 1 tablet (20 mg total) by mouth daily. 04/11/17  Yes [name],  MD  glipiZIDE (GLUCOTROL) 10 MG tablet Take 1 tablet (10 mg total) by mouth every 12 (twelve) hours. 04/11/17  Yes [name], MD  HYDROcodone-acetaminophen (NORCO/VICODIN) 5-325 MG tablet Take 1 tablet by mouth every 6 (six) hours as needed for moderate pain. 08/27/17  Yes [name], MD  lisinopril (PRINIVIL,ZESTRIL) 2.5 MG tablet Take 1 tablet (2.5 mg total) by mouth daily. as directed 04/11/17  Yes [name], MD  loratadine (CLARITIN) 10 MG tablet Take 10 mg by mouth daily as needed for allergies.    Yes [provider]  meloxicam (MOBIC) 15 MG tablet Take 1 tablet (15 mg total) by mouth daily. 04/11/17  Yes [name], MD  nitroGLYCERIN (NITROSTAT) 0.4 MG SL tablet PLACE 1 TABLET (0.4 MG TOTAL) UNDER THE  TONGUE EVERY 5 (FIVE) MINUTES AS NEEDED FOR CHEST PAIN. 01/15/17  Yes [name], MD  tamsulosin (FLOMAX) 0.4 MG CAPS capsule Take 1 capsule (0.4 mg total) by mouth daily. 04/11/17  Yes [name], MD  testosterone cypionate (DEPOTESTOTERONE CYPIONATE) 100 MG/ML injection INJECT 1 ML INTO THE MUSCLE EVERY 14 DAYS Patient taking differently: Inject 100 mg into the muscle every 14 (fourteen) days.  07/26/17  Yes [name], MD  tiZANidine (ZANAFLEX) 4 MG tablet TAKE 1 TABLET EVERY 6 HOURS AS NEEDED FOR MUSCLE SPASMS Patient taking differently: Take 4 mg by mouth every 6 (six) hours as needed for muscle spasms.  10/06/16  Yes [name], MD  topiramate (TOPAMAX) 50 MG tablet Take 50 mg by mouth daily.  07/21/16  Yes [provider]  traZODone (DESYREL) 50 MG tablet Take 0.5-1 tablets (25-50 mg total) by mouth at bedtime as needed for sleep. 05/25/17  Yes [name], MD    Family History Family History  Problem Relation Age of Onset  . Heart attack Mother   . Heart failure Mother   . Diabetes Mother   . Heart attack Father   . Hypertension Father   . Heart disease Brother   . Diabetes Brother   . Clotting disorder Paternal Aunt        Blood clot after minor surgery  . Clotting disorder Paternal Grandmother        Blood clot after minor surgery  . Clotting disorder Maternal Grandmother   . Cancer Maternal Grandmother   . Diabetes Maternal Grandfather   . Alcohol abuse Paternal Grandfather     Social History Social History   Tobacco Use  . Smoking status: Former Smoker    Packs/day: 1.00    Years: 7.00    Pack years: 7.00    Types: Cigarettes    Last attempt to quit: 09/22/2012    Years since quitting: 4.9  . Smokeless tobacco: Never Used  Substance Use Topics  . Alcohol use: Yes    Comment: Rarely  . Drug use: No     Allergies   Penicillins; Ambien [zolpidem tartrate]; Cetirizine; Gluten meal; Lidocaine; Pseudoephedrine hcl;  Zolpidem; and Latex   Review of Systems Review of Systems  Constitutional: Negative for appetite change.  HENT: Negative for congestion.   Respiratory: Negative for shortness of breath.   Cardiovascular: Positive for chest pain.  Gastrointestinal: Positive for diarrhea and nausea. Negative for abdominal pain.  Genitourinary: Negative for flank pain.  Musculoskeletal: Negative for arthralgias.  Neurological: Negative for seizures.  Hematological: Negative for adenopathy.  Psychiatric/Behavioral: Negative for confusion.     Physical Exam Updated Vital Signs BP (!) 142/101  Pulse 66   Temp 98 F (36.7 C) (Oral)   Resp 13   SpO2 99%   Physical Exam  Constitutional: He appears well-developed.  Patient is obese.  HENT:  Head: Normocephalic.  Eyes: EOM are normal.  Neck: Neck supple.  Cardiovascular: Normal rate.  Pulmonary/Chest: Effort normal and breath sounds normal.  Abdominal: Soft. Bowel sounds are normal.  Musculoskeletal:       Right lower leg: He exhibits edema.       Left lower leg: He exhibits edema.  Neurological: He is alert.  Skin: Skin is warm. Capillary refill takes less than 2 seconds.  Psychiatric: He has a normal mood and affect.     ED Treatments / Results  Labs (all labs ordered are listed, but only abnormal results are displayed) Labs Reviewed  BASIC METABOLIC PANEL - Abnormal; Notable for the following components:      Result Value   CO2 20 (*)    Glucose, Bld 366 (*)    All other components within normal limits  CBG MONITORING, ED - Abnormal; Notable for the following components:   Glucose-Capillary 236 (*)    All other components within normal limits  CBC  I-STAT TROPONIN, ED  I-STAT TROPONIN, ED    EKG EKG Interpretation  Date/Time:  Monday September 03 2017 16:19:32 EDT Ventricular Rate:  94 PR Interval:  154 QRS Duration: 92 QT Interval:  354 QTC Calculation: 442 R Axis:   20 Text Interpretation:  Normal sinus rhythm Cannot  rule out Anterior infarct , age undetermined Abnormal ECG No significant change since last tracing Confirmed by Davonna Belling [phone]) on 09/03/2017 8:35:31 PM   Radiology Dg Chest 2 View  Result Date: 09/03/2017 CLINICAL DATA:  Sharp chest pain with relief from 2 nitroglycerin tablets EXAM: CHEST - 2 VIEW COMPARISON:  11/22/2012 FINDINGS: Normal heart size, mediastinal contours, and pulmonary vascularity. Lungs clear. No infiltrate, pleural effusion or pneumothorax. Minimal scattered endplate spur formation thoracic spine. IMPRESSION: No acute abnormalities. Electronically Signed   By: Lavonia Dana M.D.   On: 09/03/2017 16:51    Procedures Procedures (including critical care time)  Medications Ordered in ED Medications - No data to display   Initial Impression / Assessment and Plan / ED Course  I have reviewed the triage vital signs and the nursing notes.  Pertinent labs & imaging results that were available during my care of the patient were reviewed by me and considered in my medical decision making (see chart for details).    Patient with chest pain.  Enzymes negative x2 and EKG reassuring.  No known cardiac history and had negative stress test around 4 years ago.  However does have risk factors.  Enzymes negative x2.  Was exposed to gluten today and states that can give him similar symptoms.  Will discharge home and follow-up with PCP as needed.  Final Clinical Impressions(s) / ED Diagnoses   Final diagnoses:  Nonspecific chest pain    ED Discharge Orders    None       Davonna Belling, MD 09/03/17 2136

## 2017-09-11 DIAGNOSIS — Z3009 Encounter for other general counseling and advice on contraception: Secondary | ICD-10-CM | POA: Diagnosis not present

## 2017-09-20 ENCOUNTER — Other Ambulatory Visit: Payer: Self-pay | Admitting: Family Medicine

## 2017-09-20 NOTE — Telephone Encounter (Signed)
Last OV 08/02/17, Next OV 12/27/17  Last filled 05/25/17, # 30 with 3 refills

## 2017-09-25 DIAGNOSIS — G4733 Obstructive sleep apnea (adult) (pediatric): Secondary | ICD-10-CM | POA: Diagnosis not present

## 2017-09-26 ENCOUNTER — Other Ambulatory Visit: Payer: Self-pay | Admitting: Family Medicine

## 2017-10-06 ENCOUNTER — Other Ambulatory Visit: Payer: Self-pay | Admitting: Family Medicine

## 2017-10-08 ENCOUNTER — Encounter: Payer: Self-pay | Admitting: Family Medicine

## 2017-10-08 ENCOUNTER — Other Ambulatory Visit: Payer: Self-pay | Admitting: Family Medicine

## 2017-10-08 DIAGNOSIS — G8929 Other chronic pain: Secondary | ICD-10-CM

## 2017-10-08 DIAGNOSIS — M2550 Pain in unspecified joint: Principal | ICD-10-CM

## 2017-10-08 NOTE — Telephone Encounter (Signed)
Last OV 08/02/17 Hydrocodone last filled 08/27/17 #120 with 0

## 2017-10-09 MED ORDER — HYDROCODONE-ACETAMINOPHEN 5-325 MG PO TABS: 1.0000 | ORAL_TABLET | Freq: Four times a day (QID) | ORAL | 0 refills | Status: DC | PRN

## 2017-10-09 NOTE — Telephone Encounter (Signed)
Database reviewed- unable to print due to error in application.  No red flags.  Refill provided.

## 2017-10-11 ENCOUNTER — Ambulatory Visit (INDEPENDENT_AMBULATORY_CARE_PROVIDER_SITE_OTHER): Payer: BLUE CROSS/BLUE SHIELD

## 2017-10-11 DIAGNOSIS — Z23 Encounter for immunization: Secondary | ICD-10-CM

## 2017-10-24 ENCOUNTER — Encounter: Payer: Self-pay | Admitting: General Practice

## 2017-10-24 ENCOUNTER — Encounter: Payer: Self-pay | Admitting: Family Medicine

## 2017-10-31 ENCOUNTER — Other Ambulatory Visit: Payer: Self-pay | Admitting: Family Medicine

## 2017-11-02 ENCOUNTER — Encounter: Payer: Self-pay | Admitting: Family Medicine

## 2017-11-14 ENCOUNTER — Encounter: Payer: Self-pay | Admitting: Family Medicine

## 2017-11-14 DIAGNOSIS — G8929 Other chronic pain: Secondary | ICD-10-CM

## 2017-11-14 DIAGNOSIS — M2550 Pain in unspecified joint: Principal | ICD-10-CM

## 2017-11-14 NOTE — Telephone Encounter (Signed)
Last OV 08/02/17 Hydrocodone last filled 10/09/17 #120 with 0

## 2017-11-15 MED ORDER — HYDROCODONE-ACETAMINOPHEN 5-325 MG PO TABS: 1.0000 | ORAL_TABLET | Freq: Four times a day (QID) | ORAL | 0 refills | Status: DC | PRN

## 2017-11-30 DIAGNOSIS — Z302 Encounter for sterilization: Secondary | ICD-10-CM | POA: Diagnosis not present

## 2017-12-27 ENCOUNTER — Encounter: Payer: BLUE CROSS/BLUE SHIELD | Admitting: Family Medicine

## 2017-12-30 ENCOUNTER — Other Ambulatory Visit: Payer: Self-pay | Admitting: Family Medicine

## 2018-01-10 ENCOUNTER — Encounter: Payer: Self-pay | Admitting: Family Medicine

## 2018-01-10 DIAGNOSIS — G8929 Other chronic pain: Secondary | ICD-10-CM

## 2018-01-10 DIAGNOSIS — M2550 Pain in unspecified joint: Principal | ICD-10-CM

## 2018-01-10 MED ORDER — HYDROCODONE-ACETAMINOPHEN 5-325 MG PO TABS: 1.0000 | ORAL_TABLET | Freq: Four times a day (QID) | ORAL | 0 refills | Status: DC | PRN

## 2018-01-10 NOTE — Telephone Encounter (Signed)
Last OV 08/02/17 Hydrocodone last filled 11/15/17 #120 with 0

## 2018-02-04 ENCOUNTER — Encounter: Payer: Self-pay | Admitting: Family Medicine

## 2018-02-04 ENCOUNTER — Other Ambulatory Visit: Payer: Self-pay | Admitting: Family Medicine

## 2018-02-04 DIAGNOSIS — M2550 Pain in unspecified joint: Principal | ICD-10-CM

## 2018-02-04 DIAGNOSIS — G8929 Other chronic pain: Secondary | ICD-10-CM

## 2018-02-05 NOTE — Telephone Encounter (Signed)
Last OV 08/02/17 hydrocodone last filled 01/10/18 #120 with 0

## 2018-02-07 MED ORDER — HYDROCODONE-ACETAMINOPHEN 5-325 MG PO TABS: 1.0000 | ORAL_TABLET | Freq: Four times a day (QID) | ORAL | 0 refills | Status: DC | PRN

## 2018-03-29 ENCOUNTER — Other Ambulatory Visit: Payer: Self-pay | Admitting: Family Medicine

## 2018-03-29 ENCOUNTER — Encounter: Payer: Self-pay | Admitting: Family Medicine

## 2018-03-29 DIAGNOSIS — G8929 Other chronic pain: Secondary | ICD-10-CM

## 2018-03-29 DIAGNOSIS — M2550 Pain in unspecified joint: Principal | ICD-10-CM

## 2018-03-29 NOTE — Telephone Encounter (Signed)
Last OV 08/02/17 Hydrocodone last filled 02/07/18 #120 with 0

## 2018-04-01 MED ORDER — HYDROCODONE-ACETAMINOPHEN 5-325 MG PO TABS: 1.0000 | ORAL_TABLET | Freq: Four times a day (QID) | ORAL | 0 refills | Status: DC | PRN

## 2018-05-09 ENCOUNTER — Other Ambulatory Visit: Payer: Self-pay | Admitting: Family Medicine

## 2018-05-11 ENCOUNTER — Other Ambulatory Visit: Payer: Self-pay | Admitting: Family Medicine

## 2018-05-13 ENCOUNTER — Encounter: Payer: Self-pay | Admitting: Family Medicine

## 2018-05-13 ENCOUNTER — Other Ambulatory Visit: Payer: Self-pay

## 2018-05-13 ENCOUNTER — Ambulatory Visit (INDEPENDENT_AMBULATORY_CARE_PROVIDER_SITE_OTHER): Payer: Self-pay | Admitting: Family Medicine

## 2018-05-13 VITALS — BP 130/80 | Temp 98.4°F | Ht 66.5 in | Wt 350.0 lb

## 2018-05-13 DIAGNOSIS — Z6841 Body Mass Index (BMI) 40.0 and over, adult: Secondary | ICD-10-CM

## 2018-05-13 DIAGNOSIS — E1169 Type 2 diabetes mellitus with other specified complication: Secondary | ICD-10-CM

## 2018-05-13 DIAGNOSIS — E785 Hyperlipidemia, unspecified: Secondary | ICD-10-CM

## 2018-05-13 DIAGNOSIS — G8929 Other chronic pain: Secondary | ICD-10-CM

## 2018-05-13 DIAGNOSIS — E119 Type 2 diabetes mellitus without complications: Secondary | ICD-10-CM

## 2018-05-13 DIAGNOSIS — F331 Major depressive disorder, recurrent, moderate: Secondary | ICD-10-CM | POA: Insufficient documentation

## 2018-05-13 DIAGNOSIS — M2550 Pain in unspecified joint: Principal | ICD-10-CM

## 2018-05-13 MED ORDER — ALPRAZOLAM 0.5 MG PO TABS: ORAL_TABLET | ORAL | 1 refills | Status: DC

## 2018-05-13 NOTE — Progress Notes (Signed)
I have discussed the procedure for the virtual visit with the patient who has given consent to proceed with assessment and treatment.   Annamaria Salah L Alyza Artiaga, CMA     

## 2018-05-13 NOTE — Progress Notes (Signed)
Virtual Visit via Video   I connected with patient on 05/13/18 at  2:00 PM EDT by a video enabled telemedicine application and verified that I am speaking with the correct person using two identifiers.  Location patient: Home Location provider: Acupuncturist, Office Persons participating in the virtual visit: Patient, Provider, [place] (Jess B)  I discussed the limitations of evaluation and management by telemedicine and the availability of in person appointments. The patient expressed understanding and agreed to proceed.  Subjective:   HPI:  DM- chronic problem, on Glipizide 10mg  BID.  On Lisinopril for renal protection.  Not checking sugars.  Unable to schedule an eye exam due to COVID situation.  No CP, SOB, HAs, visual changes.  No numbness/tingling of hands/feet.  Denies symptomatic lows.  Hyperlipidemia- chronic problem, on Atorvastatin 10mg  daily and Fenofibrate 160mg  daily.  No abd pain, N/V.  Anxiety/depression- On Wellbutrin 150mg  daily, Prozac 20mg  daily.  Pt is more anxious due to COVID situation.  Pt is asking for a refill on Alprazolam 0.5mg   Obesity- stopped exercising, stress eating.  BMI is 55.65.  Wife yelled at him last night about the situation.  ROS:   See pertinent positives and negatives per HPI.  Patient Active Problem List   Diagnosis Date Noted   Insomnia 03/07/2017   Hyperlipidemia associated with type 2 diabetes mellitus ([place]) 12/13/2016   HTN (hypertension) 12/13/2016   Physical exam 06/01/2016   BPH (benign prostatic hyperplasia) 09/24/2015   Low testosterone 09/24/2015   Depression 09/24/2015   Chronic joint pain 09/24/2015   Chest pain at rest 11/22/2012   Diabetes mellitus, type 2 ([place]) 11/22/2012   Morbid obesity with BMI of 50.0-59.9, adult ([place]) 11/22/2012   Sleep apnea 11/22/2012    Social History   Tobacco Use   Smoking status: Former Smoker    Packs/day: 1.00    Years: 7.00    Pack years: 7.00    Types:  Cigarettes    Last attempt to quit: 09/22/2012    Years since quitting: 5.6   Smokeless tobacco: Never Used  Substance Use Topics   Alcohol use: Yes    Comment: Rarely    Current Outpatient Medications:    ALPRAZolam (XANAX) 0.5 MG tablet, TAKE 1 TABLET BY MOUTH TWICE A DAY AS NEEDED FOR ANXIETY (Patient taking differently: Take 1 mg by mouth 2 (two) times daily as needed for anxiety. ), Disp: 60 tablet, Rfl: 1   atorvastatin (LIPITOR) 10 MG tablet, TAKE 1 TABLET BY MOUTH EVERY DAY, Disp: 90 tablet, Rfl: 0   buPROPion (WELLBUTRIN XL) 150 MG 24 hr tablet, TAKE 1 TABLET BY MOUTH EVERY DAY, Disp: 90 tablet, Rfl: 0   fenofibrate 160 MG tablet, TAKE 1 TABLET BY MOUTH EVERY DAY, Disp: 90 tablet, Rfl: 0   FLUoxetine (PROZAC) 20 MG tablet, TAKE 1 TABLET BY MOUTH EVERY DAY, Disp: 90 tablet, Rfl: 1   glipiZIDE (GLUCOTROL) 10 MG tablet, Take 1 tablet (10 mg total) by mouth every 12 (twelve) hours. No further refills without a Diabetes follow up., Disp: 180 tablet, Rfl: 0   HYDROcodone-acetaminophen (NORCO/VICODIN) 5-325 MG tablet, Take 1 tablet by mouth every 6 (six) hours as needed for moderate pain., Disp: 120 tablet, Rfl: 0   lisinopril (PRINIVIL,ZESTRIL) 2.5 MG tablet, TAKE 1 TABLET (2.5 MG TOTAL) BY MOUTH DAILY. AS DIRECTED, Disp: 90 tablet, Rfl: 0   loratadine (CLARITIN) 10 MG tablet, Take 10 mg by mouth daily as needed for allergies. , Disp: , Rfl:  meloxicam (MOBIC) 15 MG tablet, TAKE 1 TABLET BY MOUTH EVERY DAY, Disp: 90 tablet, Rfl: 0   tamsulosin (FLOMAX) 0.4 MG CAPS capsule, Take 1 capsule (0.4 mg total) by mouth daily., Disp: 90 capsule, Rfl: 1   testosterone cypionate (DEPOTESTOTERONE CYPIONATE) 100 MG/ML injection, INJECT 1 ML INTO THE MUSCLE EVERY 14 DAYS (Patient taking differently: Inject 100 mg into the muscle every 14 (fourteen) days. ), Disp: 10 mL, Rfl: 0   tiZANidine (ZANAFLEX) 4 MG tablet, TAKE 1 TABLET EVERY 6 HOURS AS NEEDED FOR MUSCLE SPASMS, Disp: 45 tablet, Rfl:  3   traZODone (DESYREL) 50 MG tablet, TAKE 1/2 TO 1 TABLET BY MOUTH AT BEDTIME AS NEEDED FOR SLEEP, Disp: 90 tablet, Rfl: 1   nitroGLYCERIN (NITROSTAT) 0.4 MG SL tablet, PLACE 1 TABLET (0.4 MG TOTAL) UNDER THE TONGUE EVERY 5 (FIVE) MINUTES AS NEEDED FOR CHEST PAIN. (Patient not taking: Reported on 05/13/2018), Disp: 75 tablet, Rfl: 1   topiramate (TOPAMAX) 50 MG tablet, Take 50 mg by mouth daily. , Disp: , Rfl: 1  Allergies  Allergen Reactions   Penicillins Anaphylaxis   Ambien [Zolpidem Tartrate] Other (See Comments)    nightmares   Cetirizine Other (See Comments)    Makes feel "drunk"   Gluten Meal Other (See Comments)    Diarrhea and headache   Lidocaine Swelling and Other (See Comments)    Redness that spreads.  PATIENT CAN USE PRESERVATIVE FREE LIDOCAINE    Pseudoephedrine Hcl Other (See Comments)    Makes feel "drunk"   Zolpidem Other (See Comments)    nightmares   Latex Itching, Swelling and Rash    Objective:   There were no vitals taken for this visit. AAOx3, NAD NCAT, EOMI Morbidly obese No obvious CN deficits Coloring WNL Pt is able to speak clearly, coherently without shortness of breath or increased work of breathing.  Thought process is linear.  Mood is appropriate.   Assessment and Plan:   Hyperlipidemia- chronic problem, tolerating statin w/ difficulty.  Stressed need for healthy diet and regular exercise.  Check labs.  Adjust meds prn   DM- chronic problem.  Not checking sugars.  Due for eye exam- pt plans to schedule.  Unable to do foot exam virtually.  Stressed need for diet and exercise.  Check labs.  Adjust tx prn.  Obesity- pt has gained weight.  He admits to stress eating and no exercise.  Stressed need for healthy diet and regular exercise.  Depression/anxiety- pt has increased anxiety due to COVID crisis.  Refill Alprazolam to use PRN.   Annye Asa, MD 05/13/2018

## 2018-05-14 ENCOUNTER — Other Ambulatory Visit (INDEPENDENT_AMBULATORY_CARE_PROVIDER_SITE_OTHER): Payer: Managed Care, Other (non HMO)

## 2018-05-14 DIAGNOSIS — Z6841 Body Mass Index (BMI) 40.0 and over, adult: Secondary | ICD-10-CM

## 2018-05-14 DIAGNOSIS — E119 Type 2 diabetes mellitus without complications: Secondary | ICD-10-CM | POA: Diagnosis not present

## 2018-05-14 DIAGNOSIS — E785 Hyperlipidemia, unspecified: Secondary | ICD-10-CM

## 2018-05-14 DIAGNOSIS — E1169 Type 2 diabetes mellitus with other specified complication: Secondary | ICD-10-CM

## 2018-05-14 MED ORDER — HYDROCODONE-ACETAMINOPHEN 5-325 MG PO TABS: 1.0000 | ORAL_TABLET | Freq: Four times a day (QID) | ORAL | 0 refills | Status: DC | PRN

## 2018-05-14 NOTE — Telephone Encounter (Signed)
Pt was seen yesterday VV Hydrocodone last filled 04/01/18 #120 with 0

## 2018-05-16 ENCOUNTER — Ambulatory Visit (INDEPENDENT_AMBULATORY_CARE_PROVIDER_SITE_OTHER): Payer: Managed Care, Other (non HMO) | Admitting: Family Medicine

## 2018-05-16 ENCOUNTER — Encounter: Payer: Self-pay | Admitting: Family Medicine

## 2018-05-16 VITALS — Temp 97.8°F

## 2018-05-16 DIAGNOSIS — M79675 Pain in left toe(s): Secondary | ICD-10-CM

## 2018-05-16 NOTE — Progress Notes (Signed)
Virtual Visit via Video   I connected with patient on 05/16/18 at  3:20 PM EDT by a video enabled telemedicine application and verified that I am speaking with the correct person using two identifiers.  Location patient: Home Location provider: Acupuncturist, Office Persons participating in the virtual visit: Patient, Provider, [place] (Jess B)  I discussed the limitations of evaluation and management by telemedicine and the availability of in person appointments. The patient expressed understanding and agreed to proceed.  Subjective:   HPI:  Toe pain- L 5th toe, pt caught the corner of the wall.  Occurred 2 night ago.  Applied ice.  'it looks to me like it's dislocated'.  Toe is red and swollen.  Able to bend toe.  Pt is walking on foot but has altered gait.  'it is excruciating to put on socks and shoes'  ROS:   See pertinent positives and negatives per HPI.  Patient Active Problem List   Diagnosis Date Noted  . Moderate episode of recurrent major depressive disorder ([place]) 05/13/2018  . Insomnia 03/07/2017  . Hyperlipidemia associated with type 2 diabetes mellitus ([place]) 12/13/2016  . HTN (hypertension) 12/13/2016  . Physical exam 06/01/2016  . BPH (benign prostatic hyperplasia) 09/24/2015  . Low testosterone 09/24/2015  . Depression 09/24/2015  . Chronic joint pain 09/24/2015  . Chest pain at rest 11/22/2012  . Diabetes mellitus, type 2 ([place]) 11/22/2012  . Morbid obesity with BMI of 50.0-59.9, adult ([place]) 11/22/2012  . Sleep apnea 11/22/2012    Social History   Tobacco Use  . Smoking status: Former Smoker    Packs/day: 1.00    Years: 7.00    Pack years: 7.00    Types: Cigarettes    Last attempt to quit: 09/22/2012    Years since quitting: 5.6  . Smokeless tobacco: Never Used  Substance Use Topics  . Alcohol use: Yes    Comment: Rarely    Current Outpatient Medications:  .  ALPRAZolam (XANAX) 0.5 MG tablet, TAKE 1 TABLET BY MOUTH TWICE A DAY AS NEEDED FOR  ANXIETY, Disp: 60 tablet, Rfl: 1 .  atorvastatin (LIPITOR) 10 MG tablet, TAKE 1 TABLET BY MOUTH EVERY DAY, Disp: 90 tablet, Rfl: 0 .  buPROPion (WELLBUTRIN XL) 150 MG 24 hr tablet, TAKE 1 TABLET BY MOUTH EVERY DAY, Disp: 90 tablet, Rfl: 0 .  fenofibrate 160 MG tablet, TAKE 1 TABLET BY MOUTH EVERY DAY, Disp: 90 tablet, Rfl: 0 .  FLUoxetine (PROZAC) 20 MG tablet, TAKE 1 TABLET BY MOUTH EVERY DAY, Disp: 90 tablet, Rfl: 1 .  glipiZIDE (GLUCOTROL) 10 MG tablet, Take 1 tablet (10 mg total) by mouth every 12 (twelve) hours. No further refills without a Diabetes follow up., Disp: 180 tablet, Rfl: 0 .  HYDROcodone-acetaminophen (NORCO/VICODIN) 5-325 MG tablet, Take 1 tablet by mouth every 6 (six) hours as needed for moderate pain., Disp: 120 tablet, Rfl: 0 .  lisinopril (PRINIVIL,ZESTRIL) 2.5 MG tablet, TAKE 1 TABLET (2.5 MG TOTAL) BY MOUTH DAILY. AS DIRECTED, Disp: 90 tablet, Rfl: 0 .  loratadine (CLARITIN) 10 MG tablet, Take 10 mg by mouth daily as needed for allergies. , Disp: , Rfl:  .  meloxicam (MOBIC) 15 MG tablet, TAKE 1 TABLET BY MOUTH EVERY DAY, Disp: 90 tablet, Rfl: 0 .  nitroGLYCERIN (NITROSTAT) 0.4 MG SL tablet, PLACE 1 TABLET (0.4 MG TOTAL) UNDER THE TONGUE EVERY 5 (FIVE) MINUTES AS NEEDED FOR CHEST PAIN., Disp: 75 tablet, Rfl: 1 .  tamsulosin (FLOMAX) 0.4 MG CAPS capsule,  Take 1 capsule (0.4 mg total) by mouth daily., Disp: 90 capsule, Rfl: 1 .  testosterone cypionate (DEPOTESTOTERONE CYPIONATE) 100 MG/ML injection, INJECT 1 ML INTO THE MUSCLE EVERY 14 DAYS (Patient taking differently: Inject 100 mg into the muscle every 14 (fourteen) days. ), Disp: 10 mL, Rfl: 0 .  tiZANidine (ZANAFLEX) 4 MG tablet, TAKE 1 TABLET EVERY 6 HOURS AS NEEDED FOR MUSCLE SPASMS, Disp: 45 tablet, Rfl: 3 .  topiramate (TOPAMAX) 50 MG tablet, Take 50 mg by mouth daily. , Disp: , Rfl: 1 .  traZODone (DESYREL) 50 MG tablet, TAKE 1/2 TO 1 TABLET BY MOUTH AT BEDTIME AS NEEDED FOR SLEEP, Disp: 90 tablet, Rfl: 1  Allergies   Allergen Reactions  . Penicillins Anaphylaxis  . Ambien [Zolpidem Tartrate] Other (See Comments)    nightmares  . Cetirizine Other (See Comments)    Makes feel "drunk"  . Gluten Meal Other (See Comments)    Diarrhea and headache  . Lidocaine Swelling and Other (See Comments)    Redness that spreads.  PATIENT CAN USE PRESERVATIVE FREE LIDOCAINE   . Pseudoephedrine Hcl Other (See Comments)    Makes feel "drunk"  . Zolpidem Other (See Comments)    nightmares  . Latex Itching, Swelling and Rash    Objective:   Temp 97.8 F (36.6 C) (Oral)   AAOx3, NAD NCAT, EOMI Morbidly obese No obvious CN deficits Coloring WNL Pt is able to speak clearly, coherently without shortness of breath or increased work of breathing.  Thought process is linear.  Mood is appropriate. L 5th toe w/ distal redness and swelling, no obvious malalignment   Assessment and Plan:   Toe pain- new.  Pt stubbed his 5th toe into wall 2 nights ago.  No malalignment or obvious dislocation.  No obvious fx visible on video.  Discussed supportive care- buddy tape, ice, supportive shoes (sneakers).  If at all worsening or fails to improve, pt is to let me know.  Pt expressed understanding and is in agreement w/ plan.    Annye Asa, MD 05/16/2018

## 2018-05-21 ENCOUNTER — Other Ambulatory Visit: Payer: Managed Care, Other (non HMO)

## 2018-05-26 ENCOUNTER — Other Ambulatory Visit: Payer: Self-pay | Admitting: Family Medicine

## 2018-05-29 ENCOUNTER — Other Ambulatory Visit (INDEPENDENT_AMBULATORY_CARE_PROVIDER_SITE_OTHER): Payer: Managed Care, Other (non HMO)

## 2018-05-29 VITALS — BP 128/80

## 2018-05-29 DIAGNOSIS — Z111 Encounter for screening for respiratory tuberculosis: Secondary | ICD-10-CM

## 2018-05-29 LAB — BASIC METABOLIC PANEL
BUN: 12 mg/dL (ref 6–23)
CO2: 24 mEq/L (ref 19–32)
Calcium: 9.2 mg/dL (ref 8.4–10.5)
Chloride: 101 mEq/L (ref 96–112)
Creatinine, Ser: 0.97 mg/dL (ref 0.40–1.50)
GFR: 81.54 mL/min (ref >60.00)
Glucose, Bld: 271 mg/dL — ABNORMAL HIGH (ref 70–99)
Potassium: 4.3 mEq/L (ref 3.5–5.1)
Sodium: 134 mEq/L — ABNORMAL LOW (ref 135–145)

## 2018-05-29 LAB — CBC WITH DIFFERENTIAL/PLATELET
Basophils Absolute: 0 10*3/uL (ref 0.0–0.1)
Basophils Relative: 0.9 % (ref 0.0–3.0)
Eosinophils Absolute: 0 10*3/uL (ref 0.0–0.7)
Eosinophils Relative: 1.3 % (ref 0.0–5.0)
HCT: 47.1 % (ref 39.0–52.0)
Hemoglobin: 16.5 g/dL (ref 13.0–17.0)
Lymphocytes Relative: 27.3 % (ref 12.0–46.0)
Lymphs Abs: 0.9 10*3/uL (ref 0.7–4.0)
MCHC: 35 g/dL (ref 30.0–36.0)
MCV: 90.8 fl (ref 78.0–100.0)
Monocytes Absolute: 0.6 10*3/uL (ref 0.1–1.0)
Monocytes Relative: 16.9 % — ABNORMAL HIGH (ref 3.0–12.0)
Neutro Abs: 1.9 10*3/uL (ref 1.4–7.7)
Neutrophils Relative %: 53.6 % (ref 43.0–77.0)
Platelets: 174 10*3/uL (ref 150.0–400.0)
RBC: 5.19 Mil/uL (ref 4.22–5.81)
RDW: 12.9 % (ref 11.5–15.5)
WBC: 3.5 10*3/uL — ABNORMAL LOW (ref 4.0–10.5)

## 2018-05-29 LAB — LIPID PANEL
Cholesterol: 133 mg/dL (ref 0–200)
HDL: 39.2 mg/dL (ref >39.00)
LDL Cholesterol: 61 mg/dL (ref 0–99)
NonHDL: 94.2
Total CHOL/HDL Ratio: 3
Triglycerides: 166 mg/dL — ABNORMAL HIGH (ref 0.0–149.0)
VLDL: 33.2 mg/dL (ref 0.0–40.0)

## 2018-05-29 LAB — HEPATIC FUNCTION PANEL
ALT: 36 U/L (ref 0–53)
AST: 28 U/L (ref 0–37)
Albumin: 4.1 g/dL (ref 3.5–5.2)
Alkaline Phosphatase: 55 U/L (ref 39–117)
Bilirubin, Direct: 0.1 mg/dL (ref 0.0–0.3)
Total Bilirubin: 0.5 mg/dL (ref 0.2–1.2)
Total Protein: 6.7 g/dL (ref 6.0–8.3)

## 2018-05-29 LAB — HEMOGLOBIN A1C: Hgb A1c MFr Bld: 9.9 % — ABNORMAL HIGH (ref 4.6–6.5)

## 2018-05-29 LAB — TSH: TSH: 2.71 u[IU]/mL (ref 0.35–4.50)

## 2018-05-30 ENCOUNTER — Other Ambulatory Visit: Payer: Self-pay | Admitting: General Practice

## 2018-05-30 MED ORDER — DAPAGLIFLOZIN PROPANEDIOL 5 MG PO TABS: 5.0000 mg | ORAL_TABLET | Freq: Every day | ORAL | 1 refills | Status: DC

## 2018-06-01 LAB — QUANTIFERON-TB GOLD PLUS
Mitogen-NIL: 7.02 IU/mL
NIL: 0.39 IU/mL
QuantiFERON-TB Gold Plus: NEGATIVE
TB1-NIL: <0 IU/mL
TB2-NIL: <0 IU/mL

## 2018-06-03 ENCOUNTER — Encounter: Payer: Self-pay | Admitting: Family Medicine

## 2018-06-03 ENCOUNTER — Encounter: Payer: Self-pay | Admitting: General Practice

## 2018-06-18 ENCOUNTER — Other Ambulatory Visit: Payer: Self-pay | Admitting: Family Medicine

## 2018-06-18 ENCOUNTER — Encounter: Payer: Self-pay | Admitting: Family Medicine

## 2018-06-18 DIAGNOSIS — G8929 Other chronic pain: Secondary | ICD-10-CM

## 2018-06-19 MED ORDER — HYDROCODONE-ACETAMINOPHEN 5-325 MG PO TABS: 1.0000 | ORAL_TABLET | Freq: Four times a day (QID) | ORAL | 0 refills | Status: DC | PRN

## 2018-06-19 NOTE — Telephone Encounter (Signed)
Last OV 05/16/18 Hydrocodone last filled 05/14/18 #120 with 0

## 2018-07-11 ENCOUNTER — Other Ambulatory Visit: Payer: Self-pay | Admitting: Family Medicine

## 2018-07-19 ENCOUNTER — Encounter: Payer: Self-pay | Admitting: Family Medicine

## 2018-07-19 DIAGNOSIS — G8929 Other chronic pain: Secondary | ICD-10-CM

## 2018-07-22 MED ORDER — HYDROCODONE-ACETAMINOPHEN 5-325 MG PO TABS: 1.0000 | ORAL_TABLET | Freq: Four times a day (QID) | ORAL | 0 refills | Status: DC | PRN

## 2018-07-22 NOTE — Telephone Encounter (Signed)
Last OV 05/16/18 Hydrocodone last filled 06/19/18 #120 with 0

## 2018-08-16 ENCOUNTER — Ambulatory Visit: Payer: Managed Care, Other (non HMO) | Admitting: Family Medicine

## 2018-08-28 ENCOUNTER — Encounter: Payer: Self-pay | Admitting: Family Medicine

## 2018-08-28 DIAGNOSIS — G8929 Other chronic pain: Secondary | ICD-10-CM

## 2018-08-28 NOTE — Telephone Encounter (Signed)
Last OV 05/16/18 Hydrocodone last filled 07/22/18 #120 with 0

## 2018-08-29 MED ORDER — HYDROCODONE-ACETAMINOPHEN 5-325 MG PO TABS: 1.0000 | ORAL_TABLET | Freq: Four times a day (QID) | ORAL | 0 refills | Status: DC | PRN

## 2018-09-15 ENCOUNTER — Other Ambulatory Visit: Payer: Self-pay | Admitting: Family Medicine

## 2018-10-02 ENCOUNTER — Other Ambulatory Visit: Payer: Self-pay | Admitting: Family Medicine

## 2018-10-02 ENCOUNTER — Encounter: Payer: Self-pay | Admitting: Family Medicine

## 2018-10-02 DIAGNOSIS — G8929 Other chronic pain: Secondary | ICD-10-CM

## 2018-10-03 MED ORDER — HYDROCODONE-ACETAMINOPHEN 5-325 MG PO TABS: 1.0000 | ORAL_TABLET | Freq: Four times a day (QID) | ORAL | 0 refills | Status: DC | PRN

## 2018-10-03 NOTE — Telephone Encounter (Signed)
Last OV 05/16/18 Hydrocodone last filled 08/29/18 #120 with 0

## 2018-10-15 ENCOUNTER — Other Ambulatory Visit: Payer: Self-pay

## 2018-10-15 ENCOUNTER — Encounter: Payer: Self-pay | Admitting: Internal Medicine

## 2018-10-15 ENCOUNTER — Encounter: Payer: Self-pay | Admitting: Family Medicine

## 2018-10-15 ENCOUNTER — Ambulatory Visit (INDEPENDENT_AMBULATORY_CARE_PROVIDER_SITE_OTHER): Payer: Managed Care, Other (non HMO) | Admitting: Family Medicine

## 2018-10-15 VITALS — BP 124/84 | HR 63 | Temp 97.6°F | Resp 17 | Ht 67.0 in | Wt 352.5 lb

## 2018-10-15 DIAGNOSIS — Z23 Encounter for immunization: Secondary | ICD-10-CM

## 2018-10-15 DIAGNOSIS — Z1211 Encounter for screening for malignant neoplasm of colon: Secondary | ICD-10-CM

## 2018-10-15 DIAGNOSIS — R350 Frequency of micturition: Secondary | ICD-10-CM | POA: Diagnosis not present

## 2018-10-15 DIAGNOSIS — E119 Type 2 diabetes mellitus without complications: Secondary | ICD-10-CM

## 2018-10-15 DIAGNOSIS — N401 Enlarged prostate with lower urinary tract symptoms: Secondary | ICD-10-CM | POA: Diagnosis not present

## 2018-10-15 DIAGNOSIS — Z0000 Encounter for general adult medical examination without abnormal findings: Secondary | ICD-10-CM | POA: Diagnosis not present

## 2018-10-15 DIAGNOSIS — Z6841 Body Mass Index (BMI) 40.0 and over, adult: Secondary | ICD-10-CM

## 2018-10-15 LAB — BASIC METABOLIC PANEL
BUN: 17 mg/dL (ref 6–23)
CO2: 24 mEq/L (ref 19–32)
Calcium: 9.7 mg/dL (ref 8.4–10.5)
Chloride: 101 mEq/L (ref 96–112)
Creatinine, Ser: 0.98 mg/dL (ref 0.40–1.50)
GFR: 80.46 mL/min (ref >60.00)
Glucose, Bld: 235 mg/dL — ABNORMAL HIGH (ref 70–99)
Potassium: 4.2 mEq/L (ref 3.5–5.1)
Sodium: 134 mEq/L — ABNORMAL LOW (ref 135–145)

## 2018-10-15 LAB — CBC WITH DIFFERENTIAL/PLATELET
Basophils Absolute: 0 10*3/uL (ref 0.0–0.1)
Basophils Relative: 0.4 % (ref 0.0–3.0)
Eosinophils Absolute: 0.1 10*3/uL (ref 0.0–0.7)
Eosinophils Relative: 1.7 % (ref 0.0–5.0)
HCT: 44.6 % (ref 39.0–52.0)
Hemoglobin: 15.2 g/dL (ref 13.0–17.0)
Lymphocytes Relative: 33.5 % (ref 12.0–46.0)
Lymphs Abs: 1.8 10*3/uL (ref 0.7–4.0)
MCHC: 34 g/dL (ref 30.0–36.0)
MCV: 91.4 fl (ref 78.0–100.0)
Monocytes Absolute: 0.6 10*3/uL (ref 0.1–1.0)
Monocytes Relative: 10.4 % (ref 3.0–12.0)
Neutro Abs: 2.9 10*3/uL (ref 1.4–7.7)
Neutrophils Relative %: 54 % (ref 43.0–77.0)
Platelets: 238 10*3/uL (ref 150.0–400.0)
RBC: 4.88 Mil/uL (ref 4.22–5.81)
RDW: 13.2 % (ref 11.5–15.5)
WBC: 5.3 10*3/uL (ref 4.0–10.5)

## 2018-10-15 LAB — HEPATIC FUNCTION PANEL
ALT: 18 U/L (ref 0–53)
AST: 14 U/L (ref 0–37)
Albumin: 4.5 g/dL (ref 3.5–5.2)
Alkaline Phosphatase: 43 U/L (ref 39–117)
Bilirubin, Direct: 0.1 mg/dL (ref 0.0–0.3)
Total Bilirubin: 0.4 mg/dL (ref 0.2–1.2)
Total Protein: 7 g/dL (ref 6.0–8.3)

## 2018-10-15 LAB — LIPID PANEL
Cholesterol: 152 mg/dL (ref 0–200)
HDL: 47.9 mg/dL (ref >39.00)
LDL Cholesterol: 84 mg/dL (ref 0–99)
NonHDL: 104.32
Total CHOL/HDL Ratio: 3
Triglycerides: 104 mg/dL (ref 0.0–149.0)
VLDL: 20.8 mg/dL (ref 0.0–40.0)

## 2018-10-15 LAB — HEMOGLOBIN A1C: Hgb A1c MFr Bld: 9 % — ABNORMAL HIGH (ref 4.6–6.5)

## 2018-10-15 LAB — TSH: TSH: 2.26 u[IU]/mL (ref 0.35–4.50)

## 2018-10-15 LAB — PSA: PSA: 0.07 ng/mL — ABNORMAL LOW (ref 0.10–4.00)

## 2018-10-15 NOTE — Progress Notes (Signed)
   Subjective:    Patient ID: Caleb Quam., male    DOB: [birth year], 53 y.o.   MRN: [id number]  HPI CPE- UTD on Tdap.  Due for flu, colonoscopy, foot exam.  Has eye exam scheduled.     Review of Systems Patient reports no vision/hearing changes, anorexia, fever ,adenopathy, persistant/recurrent hoarseness, swallowing issues, chest pain, palpitations, edema, persistant/recurrent cough, hemoptysis, dyspnea (rest,exertional, paroxysmal nocturnal), gastrointestinal  bleeding (melena, rectal bleeding), abdominal pain, excessive heart burn, GU symptoms (dysuria, hematuria, voiding/incontinence issues) syncope, focal weakness, memory loss, numbness & tingling, skin/hair/nail changes, depression, anxiety, abnormal bruising/bleeding, musculoskeletal symptoms/signs.     Objective:   Physical Exam General Appearance:    Alert, cooperative, no distress, appears stated age, obese  Head:    Normocephalic, without obvious abnormality, atraumatic  Eyes:    PERRL, conjunctiva/corneas clear, EOM's intact, fundi    benign, both eyes       Ears:    Normal TM's and external ear canals, both ears  Nose:   Deferred due to COVID  Throat:   Neck:   Supple, symmetrical, trachea midline, no adenopathy;       thyroid:  No enlargement/tenderness/nodules  Back:     Symmetric, no curvature, ROM normal, no CVA tenderness  Lungs:     Clear to auscultation bilaterally, respirations unlabored  Chest wall:    No tenderness or deformity  Heart:    Regular rate and rhythm, S1 and S2 normal, no murmur, rub   or gallop  Abdomen:     Soft, non-tender, bowel sounds active all four quadrants,    no masses, no organomegaly, obese  Genitalia:    Deferred at pt request  Rectal:    Extremities:   Extremities normal, atraumatic, no cyanosis or edema  Pulses:   2+ and symmetric all extremities  Skin:   Skin color, texture, turgor normal, no rashes or lesions  Lymph nodes:   Cervical, supraclavicular, and axillary nodes  normal  Neurologic:   CNII-XII intact. Normal strength, sensation and reflexes      throughout          Assessment & Plan:

## 2018-10-15 NOTE — Assessment & Plan Note (Signed)
Chronic problem.  Overdue on eye exam- scheduled.  Foot exam done today.  On ACE for renal protection.  Stressed need for healthy diet and regular exercise.  Check labs.  Adjust meds prn

## 2018-10-15 NOTE — Patient Instructions (Signed)
Follow up in 3-4 months to recheck diabetes We'll notify you of your lab results and make any changes if needed Continue to work on healthy diet and regular exercise- you can do it!!! We'll call you with your GI appt Have your eye doctor send me a copy of their report Call with any questions or concerns Stay Safe!!!

## 2018-10-15 NOTE — Assessment & Plan Note (Signed)
Ongoing issue for pt.  Stressed need for healthy diet and regular exercise as this poses a large risk to his health.  Will continue to follow.

## 2018-10-15 NOTE — Assessment & Plan Note (Signed)
Pt's PE WNL w/ exception of obesity.  Flu shot given.  Due for colonoscopy- referral placed.  Foot exam done today.  Check labs.  Anticipatory guidance provided.

## 2018-10-15 NOTE — Assessment & Plan Note (Signed)
Check PSA level 

## 2018-10-16 ENCOUNTER — Other Ambulatory Visit: Payer: Self-pay | Admitting: General Practice

## 2018-10-16 MED ORDER — TRULICITY 0.75 MG/0.5ML ~~LOC~~ SOAJ: 0.7500 mg | SUBCUTANEOUS | 3 refills | Status: DC

## 2018-10-22 ENCOUNTER — Telehealth: Payer: Self-pay | Admitting: *Deleted

## 2018-10-22 NOTE — Telephone Encounter (Signed)
This pt has a BMI of 55.2- he has some medical hx that will require him to have an OV- he has no GI hx here in [place] pt and LM for him to return the call- he needs to have an OVLelan Pons PV

## 2018-10-22 NOTE — Telephone Encounter (Signed)
OV 11-17 with Dr Carlean Purl- I canceled PV and [place] colon Caleb White pV

## 2018-11-02 ENCOUNTER — Other Ambulatory Visit: Payer: Self-pay | Admitting: Family Medicine

## 2018-11-06 ENCOUNTER — Other Ambulatory Visit: Payer: Self-pay | Admitting: Family Medicine

## 2018-11-06 NOTE — Telephone Encounter (Signed)
Last OV 10/15/18 Alprazolam last filled 05/13/18 #60 with 1

## 2018-11-10 ENCOUNTER — Encounter: Payer: Self-pay | Admitting: Family Medicine

## 2018-11-11 ENCOUNTER — Other Ambulatory Visit: Payer: Self-pay

## 2018-11-11 DIAGNOSIS — G8929 Other chronic pain: Secondary | ICD-10-CM

## 2018-11-11 MED ORDER — HYDROCODONE-ACETAMINOPHEN 5-325 MG PO TABS: 1.0000 | ORAL_TABLET | Freq: Four times a day (QID) | ORAL | 0 refills | Status: DC | PRN

## 2018-11-11 NOTE — Telephone Encounter (Signed)
Last refill: 9.17.20 #120, 0 Last OV: 9.29.20 dx. CPE

## 2018-11-21 ENCOUNTER — Encounter: Payer: Managed Care, Other (non HMO) | Admitting: Internal Medicine

## 2018-11-23 ENCOUNTER — Other Ambulatory Visit: Payer: Self-pay | Admitting: Family Medicine

## 2018-12-03 ENCOUNTER — Other Ambulatory Visit: Payer: Self-pay

## 2018-12-03 ENCOUNTER — Encounter: Payer: Self-pay | Admitting: Internal Medicine

## 2018-12-03 ENCOUNTER — Ambulatory Visit: Payer: Managed Care, Other (non HMO) | Admitting: Internal Medicine

## 2018-12-03 VITALS — BP 132/66 | HR 76 | Temp 97.6°F | Ht 66.0 in | Wt 355.4 lb

## 2018-12-03 DIAGNOSIS — Z1211 Encounter for screening for malignant neoplasm of colon: Secondary | ICD-10-CM

## 2018-12-03 DIAGNOSIS — Z6841 Body Mass Index (BMI) 40.0 and over, adult: Secondary | ICD-10-CM | POA: Diagnosis not present

## 2018-12-03 NOTE — Patient Instructions (Signed)
Great to meet you today.  As we discussed we will plan to contact you at the beginning of the year 2021 to arrange your colonoscopy at the hospital.  This may require coming in and speaking with a nurse or having something arranged over the phone but should not involve a visit with a co-pay.  As far as nutrition here is the information we spoke about.  Good for you for working on it is not easy but keep trying.  Healthy and nutritious eating and weight loss are made to be harder than they need to be. A simplified diet approach based around eating normally, as much as you want without restricting intake too much is better, I think. You must avoid packaged foods and try to eat real foods as much as possible. Packaged foods, sugary sodas, highly processed foods taste great but are slow poisons that lead to obesity and/or poor health.  It is very helpful to take some time each week and plan your meals. Work with your spouse, partner, family to do this as much as possible. Preparing meals ahead to take to work or school is especially helpful and will save money, too. You can do this and by working together it can take less time.  Some resources that I like are:  www.gaplesinstitute.org (Do the learning modules about healthy eating)  www.dietdoctor.com - helps with low carb diets and also can learn about and consider intermittent  fasting. If you have diabetes would not do intermittent fasting without checking with your doctor. Best to change what you eat before doing this. CHECK OUT THE DIABETES SECTION    I recommend you check your blood sugar daily and keep a log. If you are diabetic and check sugars.  Here are some guidelines to help you with meal planning -  Avoid all processed and packaged foods (bread, pasta, crackers, chips, etc) and beverages containing calories.  Avoid added sugars and excessive natural sugars.  Attention to how you feel if you consume artificial sweeteners.  Do they make  you more hungry or raise your blood sugar?  With every meal and snack, aim to get 20 g of protein (3 ounces of meat, 4 ounces of fish, 3 eggs, protein powder, 1 cup [place] yogurt, 1 cup cottage cheese, etc.)  Increase fiber in the form of non-starchy vegetables.  These help you feel full with very little carbohydrates and are good for gut health.  Eat 1 serving healthy carb per meal- 1/2 cup brown rice, beans, potato, corn- pay attention to whether or not this significantly raises your blood sugar if you are checking blood sugars. If it does, reduce the frequency you consume these.   Eat 2-3 servings of lower sugar fruits daily.  This includes berries, apples, oranges, peaches, pears, one half banana.  Have small amounts of good fats such as avocado, nuts, olive oil, nut butters, olives.  Add a little cheese to your salads to make them tasty.    This is not intended to replace anything Dr. Birdie Riddle has told you so please follow what she has said but I think she would most likely be in agreement with this.   I appreciate the opportunity to care for you. Gatha Mayer, MD, [facility]

## 2018-12-03 NOTE — Assessment & Plan Note (Signed)
He is working on it Corporate investment banker

## 2018-12-03 NOTE — Progress Notes (Signed)
Caleb White. 53 y.o. 1965-09-06 [id number]  Assessment & Plan:   Encounter Diagnoses  Name Primary?  . Colon cancer screening Yes  . Morbid obesity with BMI of 50.0-59.9, adult [facility])     Appropriate for screening colonoscopy.  Needs to be done at hospital due top BMI > 50 - he is at higher risk of sedation problems due to that.  The risks and benefits as well as alternatives of endoscopic procedure(s) have been discussed and reviewed. All questions answered. The patient agrees to proceed.  Will plan to do so in early 2021.  Discussed and encouraged continued weight loss efforts and provided nutrition info - see AVS  I appreciate the opportunity to care for him.   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 Remote hx colonoscopy and EGD for "stomach troubles" No current lower GI sxs Aware of obesity is a health issue and has gained during pandemic and recently began to reduce carbs. Allergies  Allergen Reactions  . Penicillins Anaphylaxis  . Ambien [Zolpidem Tartrate] Other (See Comments)    nightmares  . Cetirizine Other (See Comments)    Makes feel "drunk"  . Gluten Meal Other (See Comments)    Diarrhea and headache  . Lidocaine Swelling and Other (See Comments)    Redness that spreads.  PATIENT CAN USE PRESERVATIVE FREE LIDOCAINE   . Pseudoephedrine Hcl Other (See Comments)    Makes feel "drunk"  . Zolpidem Other (See Comments)    nightmares  . Latex Itching, Swelling and Rash   Current Meds  Medication Sig  . ALPRAZolam (XANAX) 0.5 MG tablet TAKE 1 TABLET BY MOUTH TWICE A DAY AS NEEDED FOR ANXIETY  . atorvastatin (LIPITOR) 10 MG tablet TAKE 1 TABLET BY MOUTH EVERY DAY  . buPROPion (WELLBUTRIN XL) 150 MG 24 hr tablet TAKE 1 TABLET BY MOUTH EVERY DAY  . Dulaglutide (TRULICITY) [id number] 0000000 SOPN Inject 0.75 mg into  the skin once a week.  Marland Kitchen FARXIGA 5 MG TABS tablet TAKE 1 TABLET BY MOUTH EVERY DAY  . fenofibrate 160 MG tablet TAKE 1 TABLET BY MOUTH EVERY DAY  . FLUoxetine (PROZAC) 20 MG tablet TAKE 1 TABLET BY MOUTH EVERY DAY  . glipiZIDE (GLUCOTROL) 10 MG tablet TAKE 1 TABLET BY MOUTH EVERY 12 HOURS **NEED OFFICE VISIT**  . HYDROcodone-acetaminophen (NORCO/VICODIN) 5-325 MG tablet Take 1 tablet by mouth every 6 (six) hours as needed for moderate pain.  Marland Kitchen lisinopril (ZESTRIL) 2.5 MG tablet TAKE 1 TABLET BY MOUTH EVERY DAY AS DIRECTED  . loratadine (CLARITIN) 10 MG tablet Take 10 mg by mouth daily as needed for allergies.   . meloxicam (MOBIC) 15 MG tablet TAKE 1 TABLET BY MOUTH EVERY DAY  . nitroGLYCERIN (NITROSTAT) 0.4 MG SL tablet PLACE 1 TABLET (0.4 MG TOTAL) UNDER THE TONGUE EVERY 5 (FIVE) MINUTES AS NEEDED FOR CHEST PAIN.  . tamsulosin (FLOMAX) 0.4 MG CAPS capsule TAKE 1 CAPSULE BY MOUTH EVERY DAY  . testosterone cypionate (DEPOTESTOTERONE CYPIONATE) 100 MG/ML injection INJECT 1 ML INTO THE MUSCLE EVERY 14 DAYS (Patient taking differently: Inject 100 mg into the muscle every 14 (fourteen) days. )  . tiZANidine (ZANAFLEX) 4 MG tablet TAKE 1 TABLET EVERY 6 HOURS AS NEEDED FOR MUSCLE SPASMS  . topiramate (TOPAMAX) 50 MG tablet Take 50 mg by mouth daily.   . traZODone (DESYREL) 50 MG tablet  TAKE 1/2 TO 1 TABLET BY MOUTH AT BEDTIME AS NEEDED FOR SLEEP   Past Medical History:  Diagnosis Date  . Arthritis    per patient  . Back injury    per patient, history of ruptured or torn disk   . Depression   . Diabetes mellitus without complication ([place])   . Foot deformity   . Gluten intolerance    self diagnosed, no h/o biopsy or blood testing  . Hyperlipidemia   . Low testosterone   . Morbid obesity with BMI of 50.0-59.9, adult ([place])   . OSA on CPAP   . Peptic ulcer   . Shortness of breath   . Sleep apnea    on CPAP  . Spinal stenosis    Past Surgical History:  Procedure Laterality Date  . BLADDER  SURGERY     at 53 years old, patient unaware of details  . COLONOSCOPY    . ESOPHAGOGASTRODUODENOSCOPY    . VASECTOMY  2019   Social History   Social History Narrative   Married, 3 kids   Sign language interpreter   Former smoker, rare EtOH, no drugs   family history includes Alcohol abuse in his paternal grandfather; Cancer in his brother and maternal grandmother; Clotting disorder in his maternal grandmother, paternal aunt, and paternal grandmother; Diabetes in his brother, maternal grandfather, and mother; Heart attack in his father and mother; Heart disease in his brother; Heart failure in his mother; Hypertension in his father.   Review of Systems As per HPI chronic foot issues - wears inserts Some skin rash  Objective:   Physical Exam BP 132/66   Pulse 76   Temp 97.6 F (36.4 C) (Temporal)   Ht 5\' 6"  (1.676 m)   Wt (!) 355 lb 6.4 oz (161.2 kg)   BMI 57.36 kg/m  Morbidly obese NAD Eyes anicteric Lungs CTA Cor NL abd obese and NT  Alert and oriented x 3

## 2018-12-13 ENCOUNTER — Emergency Department (HOSPITAL_COMMUNITY): Payer: Managed Care, Other (non HMO)

## 2018-12-13 ENCOUNTER — Other Ambulatory Visit: Payer: Self-pay

## 2018-12-13 ENCOUNTER — Emergency Department (HOSPITAL_COMMUNITY)
Admission: EM | Admit: 2018-12-13 | Discharge: 2018-12-13 | Disposition: A | Discharge status: Home / Self Care | Payer: Managed Care, Other (non HMO) | Attending: Emergency Medicine | Admitting: Emergency Medicine

## 2018-12-13 ENCOUNTER — Encounter (HOSPITAL_COMMUNITY): Payer: Self-pay

## 2018-12-13 DIAGNOSIS — Z20828 Contact with and (suspected) exposure to other viral communicable diseases: Secondary | ICD-10-CM | POA: Diagnosis not present

## 2018-12-13 DIAGNOSIS — Z79899 Other long term (current) drug therapy: Secondary | ICD-10-CM | POA: Diagnosis not present

## 2018-12-13 DIAGNOSIS — Z791 Long term (current) use of non-steroidal anti-inflammatories (NSAID): Secondary | ICD-10-CM | POA: Insufficient documentation

## 2018-12-13 DIAGNOSIS — Z87891 Personal history of nicotine dependence: Secondary | ICD-10-CM | POA: Diagnosis not present

## 2018-12-13 DIAGNOSIS — I10 Essential (primary) hypertension: Secondary | ICD-10-CM | POA: Insufficient documentation

## 2018-12-13 DIAGNOSIS — Z91040 Latex allergy status: Secondary | ICD-10-CM | POA: Insufficient documentation

## 2018-12-13 DIAGNOSIS — R112 Nausea with vomiting, unspecified: Secondary | ICD-10-CM

## 2018-12-13 DIAGNOSIS — Z7984 Long term (current) use of oral hypoglycemic drugs: Secondary | ICD-10-CM | POA: Insufficient documentation

## 2018-12-13 DIAGNOSIS — R0602 Shortness of breath: Secondary | ICD-10-CM | POA: Diagnosis not present

## 2018-12-13 DIAGNOSIS — E119 Type 2 diabetes mellitus without complications: Secondary | ICD-10-CM | POA: Insufficient documentation

## 2018-12-13 LAB — CBC WITH DIFFERENTIAL/PLATELET
Abs Immature Granulocytes: 0.03 10*3/uL (ref 0.00–0.07)
Basophils Absolute: 0 10*3/uL (ref 0.0–0.1)
Basophils Relative: 0 %
Eosinophils Absolute: 0.1 10*3/uL (ref 0.0–0.5)
Eosinophils Relative: 2 %
HCT: 47.9 % (ref 39.0–52.0)
Hemoglobin: 16.1 g/dL (ref 13.0–17.0)
Immature Granulocytes: 0 %
Lymphocytes Relative: 34 %
Lymphs Abs: 2.6 10*3/uL (ref 0.7–4.0)
MCH: 30.8 pg (ref 26.0–34.0)
MCHC: 33.6 g/dL (ref 30.0–36.0)
MCV: 91.6 fL (ref 80.0–100.0)
Monocytes Absolute: 0.6 10*3/uL (ref 0.1–1.0)
Monocytes Relative: 8 %
Neutro Abs: 4.3 10*3/uL (ref 1.7–7.7)
Neutrophils Relative %: 56 %
Platelets: 251 10*3/uL (ref 150–400)
RBC: 5.23 MIL/uL (ref 4.22–5.81)
RDW: 12.3 % (ref 11.5–15.5)
WBC: 7.7 10*3/uL (ref 4.0–10.5)
nRBC: 0 % (ref 0.0–0.2)

## 2018-12-13 LAB — POC SARS CORONAVIRUS 2 AG -  ED: SARS Coronavirus 2 Ag: NEGATIVE

## 2018-12-13 LAB — COMPREHENSIVE METABOLIC PANEL
ALT: 22 U/L (ref 0–44)
AST: 20 U/L (ref 15–41)
Albumin: 4.7 g/dL (ref 3.5–5.0)
Alkaline Phosphatase: 41 U/L (ref 38–126)
Anion gap: 9 (ref 5–15)
BUN: 21 mg/dL — ABNORMAL HIGH (ref 6–20)
CO2: 25 mmol/L (ref 22–32)
Calcium: 9.8 mg/dL (ref 8.9–10.3)
Chloride: 103 mmol/L (ref 98–111)
Creatinine, Ser: 0.89 mg/dL (ref 0.61–1.24)
GFR calc Af Amer: >60 mL/min (ref >60)
GFR calc non Af Amer: >60 mL/min (ref >60)
Glucose, Bld: 123 mg/dL — ABNORMAL HIGH (ref 70–99)
Potassium: 4 mmol/L (ref 3.5–5.1)
Sodium: 137 mmol/L (ref 135–145)
Total Bilirubin: 0.6 mg/dL (ref 0.3–1.2)
Total Protein: 7.9 g/dL (ref 6.5–8.1)

## 2018-12-13 MED ORDER — FLUTICASONE PROPIONATE 50 MCG/ACT NA SUSP: 1.0000 | Freq: Every day | NASAL | 0 refills | Status: DC | PRN

## 2018-12-13 MED ORDER — ONDANSETRON 4 MG PO TBDP: 4.0000 mg | ORAL_TABLET | Freq: Three times a day (TID) | ORAL | 0 refills | Status: DC | PRN

## 2018-12-13 MED ORDER — SODIUM CHLORIDE 0.9 % IV BOLUS: 500.0000 mL | Freq: Once | INTRAVENOUS | Status: DC

## 2018-12-13 MED ORDER — ONDANSETRON HCL 4 MG/2ML IJ SOLN
4.0000 mg | Freq: Once | INTRAMUSCULAR | Status: DC
  Filled 2018-12-13: qty 2

## 2018-12-13 MED ORDER — ALBUTEROL SULFATE HFA 108 (90 BASE) MCG/ACT IN AERS: 1.0000 | INHALATION_SPRAY | Freq: Four times a day (QID) | RESPIRATORY_TRACT | 0 refills | Status: DC | PRN

## 2018-12-13 MED ORDER — ONDANSETRON 4 MG PO TBDP
4.0000 mg | ORAL_TABLET | Freq: Once | ORAL | Status: AC
  Administered 2018-12-13: 4 mg via ORAL
  Filled 2018-12-13: qty 1

## 2018-12-13 NOTE — ED Notes (Signed)
RN attempted to establish IV access. Two attempts unsuccessful. Lab has been called to come draw blood work. Provider notified and IV zofran can be administered by 4mg  PO Disintegrating tablet. PO Fluids have been offered to Pt.

## 2018-12-13 NOTE — Discharge Instructions (Addendum)
You were seen in the emergency department today for nausea, vomiting, shortness of breath, as well as chills and fever.  Your work-up in the emergency department was overall reassuring.  Your chest x-ray did not show pneumonia or other concerning abnormalities.  Your EKG was normal.  Your labs did not show significant abnormalities.  Your rapid Covid test was negative, however sometimes these are false negatives.  We have sent a send out Covid swab.  As we discussed this can also be falsely negative early in the course of coronavirus.  Overall we suspect your symptoms are viral in nature, it could be coronavirus or an alternative virus.  We treat viruses supportively.  We are sending you home with the following medicines: -Zofran: This is a medicine to help with nausea and vomiting, please take this every 8 hours as needed -Flonase: This is a medicine to help with nasal congestion, please use 1 spray per nostril daily as needed for nasal congestion and ear discomfort. -Albuterol inhaler: This is a medicine to help with your breathing, use 1 to 2 puffs every 4-6 hours as needed for shortness of breath or wheezing.  We have prescribed you new medication(s) today. Discuss the medications prescribed today with your pharmacist as they can have adverse effects and interactions with your other medicines including over the counter and prescribed medications. Seek medical evaluation if you start to experience new or abnormal symptoms after taking one of these medicines, seek care immediately if you start to experience difficulty breathing, feeling of your throat closing, facial swelling, or rash as these could be indications of a more serious allergic reaction  We are instructing patient's with covid 19 or symptoms concerning for this to quarantine themselves for 14 days. You may be able to discontinue self quarantine if the following conditions are met:   Persons with COVID-19 who have symptoms and were  directed to care for themselves at home may discontinue home isolation under the  following conditions: - It has been at least 7 days have passed since symptoms first appeared. - AND at least 3 days (72 hours) have passed since recovery defined as resolution of fever without the use of fever-reducing medications and improvement in respiratory symptoms (e.g., cough, shortness of breath)  Please follow the below quarantine instructions.   Please follow up with primary care within 3-5 days for re-evaluation- call prior to going to the office to make them aware of your symptoms. Return to the ER for new or worsening symptoms including but not limited to increased work of breathing, fever, chest pain, passing out, or any other concerns.       Person Under Monitoring Name: Caleb White.  Location: [address] [place] [address]   Infection Prevention Recommendations for Individuals Confirmed to have, or Being Evaluated for, 2019 Novel Coronavirus (COVID-19) Infection Who Receive Care at Home  Individuals who are confirmed to have, or are being evaluated for, COVID-19 should follow the prevention steps below until a healthcare provider or local or state health department says they can return to normal activities.  Stay home except to get medical care You should restrict activities outside your home, except for getting medical care. Do not go to work, school, or public areas, and do not use public transportation or taxis.  Call ahead before visiting your doctor Before your medical appointment, call the healthcare provider and tell them that you have, or are being evaluated for, COVID-19 infection. This will help the healthcare  providers office take steps to keep other people from getting infected. Ask your healthcare provider to call the local or state health department.  Monitor your symptoms Seek prompt medical attention if your illness is worsening (e.g., difficulty  breathing). Before going to your medical appointment, call the healthcare provider and tell them that you have, or are being evaluated for, COVID-19 infection. Ask your healthcare provider to call the local or state health department.  Wear a facemask You should wear a facemask that covers your nose and mouth when you are in the same room with other people and when you visit a healthcare provider. People who live with or visit you should also wear a facemask while they are in the same room with you.  Separate yourself from other people in your home As much as possible, you should stay in a different room from other people in your home. Also, you should use a separate bathroom, if available.  Avoid sharing household items You should not share dishes, drinking glasses, cups, eating utensils, towels, bedding, or other items with other people in your home. After using these items, you should wash them thoroughly with soap and water.  Cover your coughs and sneezes Cover your mouth and nose with a tissue when you cough or sneeze, or you can cough or sneeze into your sleeve. Throw used tissues in a lined trash can, and immediately wash your hands with soap and water for at least 20 seconds or use an alcohol-based hand rub.  Wash your [facility] your hands often and thoroughly with soap and water for at least 20 seconds. You can use an alcohol-based hand sanitizer if soap and water are not available and if your hands are not visibly dirty. Avoid touching your eyes, nose, and mouth with unwashed hands.   Prevention Steps for Caregivers and Household Members of Individuals Confirmed to have, or Being Evaluated for, COVID-19 Infection Being Cared for in the Home  If you live with, or provide care at home for, a person confirmed to have, or being evaluated for, COVID-19 infection please follow these guidelines to prevent infection:  Follow healthcare providers instructions Make sure that you  understand and can help the patient follow any healthcare provider instructions for all care.  Provide for the patients basic needs You should help the patient with basic needs in the home and provide support for getting groceries, prescriptions, and other personal needs.  Monitor the patients symptoms If they are getting sicker, call his or her medical provider and tell them that the patient has, or is being evaluated for, COVID-19 infection. This will help the healthcare providers office take steps to keep other people from getting infected. Ask the healthcare provider to call the local or state health department.  Limit the number of people who have contact with the patient If possible, have only one caregiver for the patient. Other household members should stay in another home or place of residence. If this is not possible, they should stay in another room, or be separated from the patient as much as possible. Use a separate bathroom, if available. Restrict visitors who do not have an essential need to be in the home.  Keep older adults, very young children, and other sick people away from the patient Keep older adults, very young children, and those who have compromised immune systems or chronic health conditions away from the patient. This includes people with chronic heart, lung, or kidney conditions, diabetes, and cancer.  Ensure  good ventilation Make sure that shared spaces in the home have good air flow, such as from an air conditioner or an opened window, weather permitting.  Wash your hands often Wash your hands often and thoroughly with soap and water for at least 20 seconds. You can use an alcohol based hand sanitizer if soap and water are not available and if your hands are not visibly dirty. Avoid touching your eyes, nose, and mouth with unwashed hands. Use disposable paper towels to dry your hands. If not available, use dedicated cloth towels and replace them when they  become wet.  Wear a facemask and gloves Wear a disposable facemask at all times in the room and gloves when you touch or have contact with the patients blood, body fluids, and/or secretions or excretions, such as sweat, saliva, sputum, nasal mucus, vomit, urine, or feces.  Ensure the mask fits over your nose and mouth tightly, and do not touch it during use. Throw out disposable facemasks and gloves after using them. Do not reuse. Wash your hands immediately after removing your facemask and gloves. If your personal clothing becomes contaminated, carefully remove clothing and launder. Wash your hands after handling contaminated clothing. Place all used disposable facemasks, gloves, and other waste in a lined container before disposing them with other household waste. Remove gloves and wash your hands immediately after handling these items.  Do not share dishes, glasses, or other household items with the patient Avoid sharing household items. You should not share dishes, drinking glasses, cups, eating utensils, towels, bedding, or other items with a patient who is confirmed to have, or being evaluated for, COVID-19 infection. After the person uses these items, you should wash them thoroughly with soap and water.  Wash laundry thoroughly Immediately remove and wash clothes or bedding that have blood, body fluids, and/or secretions or excretions, such as sweat, saliva, sputum, nasal mucus, vomit, urine, or feces, on them. Wear gloves when handling laundry from the patient. Read and follow directions on labels of laundry or clothing items and detergent. In general, wash and dry with the warmest temperatures recommended on the label.  Clean all areas the individual has used often Clean all touchable surfaces, such as counters, tabletops, doorknobs, bathroom fixtures, toilets, phones, keyboards, tablets, and bedside tables, every day. Also, clean any surfaces that may have blood, body fluids, and/or  secretions or excretions on them. Wear gloves when cleaning surfaces the patient has come in contact with. Use a diluted bleach solution (e.g., dilute bleach with 1 part bleach and 10 parts water) or a household disinfectant with a label that says EPA-registered for coronaviruses. To make a bleach solution at home, add 1 tablespoon of bleach to 1 quart (4 cups) of water. For a larger supply, add  cup of bleach to 1 gallon (16 cups) of water. Read labels of cleaning products and follow recommendations provided on product labels. Labels contain instructions for safe and effective use of the cleaning product including precautions you should take when applying the product, such as wearing gloves or eye protection and making sure you have good ventilation during use of the product. Remove gloves and wash hands immediately after cleaning.  Monitor yourself for signs and symptoms of illness Caregivers and household members are considered close contacts, should monitor their health, and will be asked to limit movement outside of the home to the extent possible. Follow the monitoring steps for close contacts listed on the symptom monitoring form.   ? If you have  additional questions, contact your local health department or call the epidemiologist on call at [phone] (available 24/7). ? This guidance is subject to change. For the most up-to-date guidance from [facility], please refer to their website: [email or web address]  .

## 2018-12-13 NOTE — ED Triage Notes (Signed)
Pt reports feeling sick Wednesday night . States he vomited yesterday and has not vomited since then. States he has had a fever and feeling hot and cold.

## 2018-12-13 NOTE — ED Provider Notes (Signed)
[facility] EMERGENCY DEPARTMENT Provider Note   CSN: [id number] Arrival date & time: 12/13/18  1705     History   Chief Complaint Chief Complaint  Patient presents with  . Emesis  . Shortness of Breath    HPI Caleb White. is a 53 y.o. male with a hx of DM, HTN, hyperlipidemia, sleep apnea, & obesity who presents to the ED with complaints of nausea/vomiting that began 2 days prior. Patient states that Wednesday evening (11/25) he began to feel nauseous with 2 episodes of emesis. With his N/V he developed fever (100.4 temp), chills, fatigue, nasal congestion, L ear discomfort, & a degree of dyspnea with exertion. Otherwise no specific alleviating/aggravating factors to his sxs. He has been able to tolerate PO some today. Denies sore throat, cough, chest pain, abdominal pain, diarrhea, melena, hematochezia, or hematemesis. He works as an Veterinary surgeon in [place] and is unsure of covid exposures- no definitive but remains possible. Denies leg pain/swelling, hemoptysis, recent surgery/trauma, recent long travel, hormone use, personal hx of cancer, or hx of DVT/PE. Patient's wife is sick with similar sxs but seems to be improving more rapidly.   HPI  Past Medical History:  Diagnosis Date  . Arthritis    per patient  . Back injury    per patient, history of ruptured or torn disk   . Depression   . Diabetes mellitus without complication ([place])   . Foot deformity   . Gluten intolerance    self diagnosed, no h/o biopsy or blood testing  . Hyperlipidemia   . Low testosterone   . Morbid obesity with BMI of 50.0-59.9, adult ([address])   . OSA on CPAP   . Peptic ulcer   . Shortness of breath   . Sleep apnea    on CPAP  . Spinal stenosis     Patient Active Problem List   Diagnosis Date Noted  . Moderate episode of recurrent major depressive disorder ([place]) 05/13/2018  . Insomnia 03/07/2017  . Hyperlipidemia associated with type 2 diabetes mellitus  ([place]) 12/13/2016  . HTN (hypertension) 12/13/2016  . Physical exam 06/01/2016  . BPH (benign prostatic hyperplasia) 09/24/2015  . Low testosterone 09/24/2015  . Chronic joint pain 09/24/2015  . Diabetes mellitus, type 2 ([place]) 11/22/2012  . Morbid obesity with BMI of 50.0-59.9, adult ([place]) 11/22/2012  . Sleep apnea 11/22/2012    Past Surgical History:  Procedure Laterality Date  . BLADDER SURGERY     at 53 years old, patient unaware of details  . COLONOSCOPY    . ESOPHAGOGASTRODUODENOSCOPY    . VASECTOMY  2019        Home Medications    Prior to Admission medications   Medication Sig Start Date End Date Taking? Authorizing Provider  ALPRAZolam Duanne Moron) 0.5 MG tablet TAKE 1 TABLET BY MOUTH TWICE A DAY AS NEEDED FOR ANXIETY 11/06/18   [name], MD  atorvastatin (LIPITOR) 10 MG tablet TAKE 1 TABLET BY MOUTH EVERY DAY 09/16/18   [name], MD  buPROPion (WELLBUTRIN XL) 150 MG 24 hr tablet TAKE 1 TABLET BY MOUTH EVERY DAY 09/16/18   [name], MD  Dulaglutide (TRULICITY) [id number] [id number] SOPN Inject 0.75 mg into the skin once a week. 10/16/18   [name], MD  FARXIGA 5 MG TABS tablet TAKE 1 TABLET BY MOUTH EVERY DAY 11/23/18   [name], MD  fenofibrate 160 MG tablet TAKE 1 TABLET BY MOUTH EVERY DAY 09/16/18  [name], MD  FLUoxetine (PROZAC) 20 MG tablet TAKE 1 TABLET BY MOUTH EVERY DAY 07/11/18   [name], MD  glipiZIDE (GLUCOTROL) 10 MG tablet TAKE 1 TABLET BY MOUTH EVERY 12 HOURS **NEED OFFICE VISIT** 09/16/18   [name], MD  HYDROcodone-acetaminophen (NORCO/VICODIN) 5-325 MG tablet Take 1 tablet by mouth every 6 (six) hours as needed for moderate pain. 11/11/18   [name], MD  lisinopril (ZESTRIL) 2.5 MG tablet TAKE 1 TABLET BY MOUTH EVERY DAY AS DIRECTED 09/16/18   [name], MD  loratadine (CLARITIN) 10 MG tablet Take 10 mg by mouth daily as needed for allergies.     [provider]  meloxicam (MOBIC) 15 MG tablet TAKE 1 TABLET BY MOUTH EVERY DAY 09/16/18   [name], MD  nitroGLYCERIN (NITROSTAT) 0.4 MG SL tablet PLACE 1 TABLET (0.4 MG TOTAL) UNDER THE TONGUE EVERY 5 (FIVE) MINUTES AS NEEDED FOR CHEST PAIN. 11/04/18   [name], MD  tamsulosin (FLOMAX) 0.4 MG CAPS capsule TAKE 1 CAPSULE BY MOUTH EVERY DAY 11/23/18   [name], MD  testosterone cypionate (DEPOTESTOTERONE CYPIONATE) 100 MG/ML injection INJECT 1 ML INTO THE MUSCLE EVERY 14 DAYS Patient taking differently: Inject 100 mg into the muscle every 14 (fourteen) days.  07/26/17   [name], MD  tiZANidine (ZANAFLEX) 4 MG tablet TAKE 1 TABLET EVERY 6 HOURS AS NEEDED FOR MUSCLE SPASMS 10/03/18   [name], MD  topiramate (TOPAMAX) 50 MG tablet Take 50 mg by mouth daily.  07/21/16   [provider]  traZODone (DESYREL) 50 MG tablet TAKE 1/2 TO 1 TABLET BY MOUTH AT BEDTIME AS NEEDED FOR SLEEP 11/04/18   [name], MD    Family History Family History  Problem Relation Age of Onset  . Heart attack Mother   . Heart failure Mother   . Diabetes Mother   . Heart attack Father   . Hypertension Father   . Heart disease Brother   . Cancer Brother   . Diabetes Brother   . Clotting disorder Paternal Aunt        Blood clot after minor surgery  . Clotting disorder Paternal Grandmother        Blood clot after minor surgery  . Clotting disorder Maternal Grandmother   . Cancer Maternal Grandmother   . Diabetes Maternal Grandfather   . Alcohol abuse Paternal Grandfather     Social History Social History   Tobacco Use  . Smoking status: Former Smoker    Packs/day: 1.00    Years: 7.00    Pack years: 7.00    Types: Cigarettes    Quit date: 09/22/2012    Years since quitting: 6.2  . Smokeless tobacco: Never Used  Substance Use Topics  . Alcohol use: Yes    Comment: Rarely  . Drug use: No     Allergies   Penicillins, Ambien [zolpidem  tartrate], Cetirizine, Gluten meal, Lidocaine, Pseudoephedrine hcl, Zolpidem, and Latex   Review of Systems Review of Systems  Constitutional: Positive for chills and fever.  HENT: Positive for congestion and ear pain. Negative for sore throat.   Respiratory: Positive for shortness of breath. Negative for cough.   Cardiovascular: Negative for chest pain and leg swelling.  Gastrointestinal: Positive for nausea and vomiting. Negative for abdominal pain, blood in stool, constipation and diarrhea.  Genitourinary: Negative for dysuria.  Neurological: Negative for syncope.  All other systems reviewed and are negative.  Physical Exam Updated Vital Signs BP 135/78 (BP Location: Right Arm)   Pulse 65   Temp 98.6 F (37 C)   Resp 16   Ht 5\' 6"  (1.676 m)   Wt (!) 158.8 kg   SpO2 99%   BMI 56.49 kg/m   Physical Exam Vitals signs and nursing note reviewed.  Constitutional:      General: He is not in acute distress.    Appearance: He is well-developed. He is not toxic-appearing.  HENT:     Head: Normocephalic and atraumatic.     Right Ear: Tympanic membrane is not perforated, erythematous, retracted or bulging.     Left Ear: Tympanic membrane is not perforated, erythematous, retracted or bulging.     Ears:     Comments: No mastoid erythema/tenderness/swelling    Nose: Congestion present.     Right Sinus: No maxillary sinus tenderness or frontal sinus tenderness.     Left Sinus: No maxillary sinus tenderness or frontal sinus tenderness.     Mouth/Throat:     Pharynx: Oropharynx is clear. Uvula midline. No oropharyngeal exudate or posterior oropharyngeal erythema.     Comments: Posterior oropharynx is symmetric appearing. Patient tolerating own secretions without difficulty. No trismus. No drooling. No hot potato voice. No swelling beneath the tongue, submandibular compartment is soft.  Eyes:     General:        Right eye: No discharge.        Left eye: No discharge.      Conjunctiva/sclera: Conjunctivae normal.     Pupils: Pupils are equal, round, and reactive to light.  Neck:     Musculoskeletal: Normal range of motion and neck supple. No neck rigidity.  Cardiovascular:     Rate and Rhythm: Normal rate and regular rhythm.  Pulmonary:     Effort: Pulmonary effort is normal. No respiratory distress.     Breath sounds: Normal breath sounds. No wheezing, rhonchi or rales.     Comments: Ambulatory SpO2 maintained @ 100%.  Abdominal:     General: There is no distension.     Palpations: Abdomen is soft.     Tenderness: There is no abdominal tenderness. There is no guarding or rebound.  Musculoskeletal:     Right lower leg: He exhibits no tenderness. No edema.     Left lower leg: He exhibits no tenderness. No edema.  Lymphadenopathy:     Cervical: No cervical adenopathy.  Skin:    General: Skin is warm and dry.     Findings: No rash.  Neurological:     Mental Status: He is alert.     Comments: Clear speech.   Psychiatric:        Behavior: Behavior normal.      ED Treatments / Results  Labs (all labs ordered are listed, but only abnormal results are displayed) Labs Reviewed  COMPREHENSIVE METABOLIC PANEL - Abnormal; Notable for the following components:      Result Value   Glucose, Bld 123 (*)    BUN 21 (*)    All other components within normal limits  NOVEL CORONAVIRUS, NAA (HOSP ORDER, SEND-OUT TO REF LAB; TAT 18-24 HRS)  CBC WITH DIFFERENTIAL/PLATELET  POC SARS CORONAVIRUS 2 AG -  ED    EKG None  Date: 12/13/2018  Rate: 68  Rhythm: normal sinus rhythm  QRS Axis: normal  Intervals: normal  ST/T Wave abnormalities: normal  Conduction Disutrbances: none  Narrative Interpretation: No STEMI   Radiology Dg Chest Portable 1 View  Result Date: 12/13/2018 CLINICAL DATA:  Dyspnea EXAM: PORTABLE CHEST 1 VIEW COMPARISON:  09/03/2017 FINDINGS: Borderline heart size accentuated by low volumes. There is no edema, consolidation, effusion, or  pneumothorax. No acute osseous finding. IMPRESSION: Negative low volume chest. Electronically Signed   By: Monte Fantasia M.D.   On: 12/13/2018 19:59    Procedures Procedures (including critical care time)  Medications Ordered in ED Medications  sodium chloride 0.9 % bolus 500 mL (0 mLs Intravenous Hold 12/13/18 2041)  ondansetron (ZOFRAN) injection 4 mg (0 mg Intravenous Hold 12/13/18 2040)  ondansetron (ZOFRAN-ODT) disintegrating tablet 4 mg (4 mg Oral Given 12/13/18 2023)     Initial Impression / Assessment and Plan / ED Course  I have reviewed the triage vital signs and the nursing notes.  Pertinent labs & imaging results that were available during my care of the patient were reviewed by me and considered in my medical decision making (see chart for details).   Patient presents to the emergency department with complaints of nausea, vomiting, chills, fevers, URI symptoms, and intermittent dyspnea. Nontoxic appearing, resting comfortably, vitals WNL. Exam is relatively benign. Afebrile here, no sinus tenderness, sxs < 10 days, doubt acute bacterial sinusitis. No signs of AOM/AOE/Mastoiditis. Oropharynx clear. Lungs CTA, CXR without infiltrate- doubt CAP. No pneumothorax or findings to suggest CHF. No respiratory distress. Ambulatory SpO2 100%. Low risk wells- doubt PE. EKG without STEMI, no chest pain, doubt ACS. Labs overall reassuring, no leukocytosis, anemia, or electrolyte derangement, mild hyperglycemia w/o findings concerning for DKA. Overall suspect viral illness. Feeling improved in the ED, able to tolerate PO. Rapid covid swab negative, send out testing obtained. Will provide supportive care with recommendation for quarantine. I discussed results, treatment plan, need for follow-up, and return precautions with the patient. Provided opportunity for questions, patient confirmed understanding and is in agreement with plan.   Vitals:   12/13/18 2024 12/13/18 2024  BP: 129/75 129/75   Pulse: 72 74  Resp: 19 19  Temp:    SpO2: 100% 99%    Final Clinical Impressions(s) / ED Diagnoses   Final diagnoses:  Non-intractable vomiting with nausea, unspecified vomiting type  Shortness of breath    ED Discharge Orders         Ordered    fluticasone (FLONASE) 50 MCG/ACT nasal spray  Daily PRN     12/13/18 2143    albuterol (VENTOLIN HFA) 108 (90 Base) MCG/ACT inhaler  Every 6 hours PRN     12/13/18 2143    ondansetron (ZOFRAN ODT) 4 MG disintegrating tablet  Every 8 hours PRN     12/13/18 2143           Christhoper Busbee, Glynda Jaeger, PA-C 12/13/18 2203    Virgel Manifold, MD 12/14/18 2328

## 2018-12-15 LAB — NOVEL CORONAVIRUS, NAA (HOSP ORDER, SEND-OUT TO REF LAB; TAT 18-24 HRS): SARS-CoV-2, NAA: NOT DETECTED

## 2018-12-15 LAB — HM DIABETES EYE EXAM

## 2018-12-18 ENCOUNTER — Encounter: Payer: Self-pay | Admitting: Family Medicine

## 2018-12-18 DIAGNOSIS — G8929 Other chronic pain: Secondary | ICD-10-CM

## 2018-12-18 DIAGNOSIS — M2550 Pain in unspecified joint: Secondary | ICD-10-CM

## 2018-12-18 NOTE — Telephone Encounter (Signed)
Last OV 10/15/18 Hydrocodone last filled 11/11/18 #120 with 0

## 2018-12-19 MED ORDER — HYDROCODONE-ACETAMINOPHEN 5-325 MG PO TABS: 1.0000 | ORAL_TABLET | Freq: Four times a day (QID) | ORAL | 0 refills | Status: DC | PRN

## 2019-01-01 ENCOUNTER — Other Ambulatory Visit: Payer: Self-pay | Admitting: Family Medicine

## 2019-01-13 ENCOUNTER — Other Ambulatory Visit: Payer: Self-pay | Admitting: Family Medicine

## 2019-01-14 ENCOUNTER — Ambulatory Visit (INDEPENDENT_AMBULATORY_CARE_PROVIDER_SITE_OTHER): Payer: Managed Care, Other (non HMO) | Admitting: Family Medicine

## 2019-01-14 ENCOUNTER — Encounter: Payer: Self-pay | Admitting: Family Medicine

## 2019-01-14 ENCOUNTER — Other Ambulatory Visit: Payer: Self-pay

## 2019-01-14 VITALS — Temp 98.3°F

## 2019-01-14 DIAGNOSIS — G8929 Other chronic pain: Secondary | ICD-10-CM | POA: Diagnosis not present

## 2019-01-14 DIAGNOSIS — G4700 Insomnia, unspecified: Secondary | ICD-10-CM

## 2019-01-14 DIAGNOSIS — M2550 Pain in unspecified joint: Secondary | ICD-10-CM

## 2019-01-14 DIAGNOSIS — E119 Type 2 diabetes mellitus without complications: Secondary | ICD-10-CM

## 2019-01-14 MED ORDER — TRAZODONE HCL 100 MG PO TABS: 100.0000 mg | ORAL_TABLET | Freq: Every day | ORAL | 3 refills | Status: DC

## 2019-01-14 NOTE — Progress Notes (Signed)
Virtual Visit via Video   I connected with patient on 01/14/19 at  9:30 AM EST by a video enabled telemedicine application and verified that I am speaking with the correct person using two identifiers.  Location patient: Home Location provider: Acup[facility]urist, Office Persons participating in the virtual visit: Patient, Provider, [place] (Jess B)  I discussed the limitations of evaluation and management by telemedicine and the availability of in person appointments. The patient expressed understanding and agreed to proceed.  Subjective:   HPI:   DM- chronic problem, on Trulicity 0.75mg  weekly, Farxigo 5mg  daily, Glipizide 10mg  BID.  UTD on foot exam, eye exam.  On ACE for renal protection.  Not checking sugar regularly but when he spot checks, most are 'normal' w/ a few outliers that are high.  Has started walking for ~20 minutes at a time.  For Christmas got a meal plan service for pt to cook at home and manage portions.  Insomnia- ongoing issue.  On Trazodone 50mg  daily.  Pt reports able to fall asleep but is up several times/night.  Using CPAP regularly.  Will be up for an hour or more at a time.  Wife has commented on how much he is awake at night.    Chronic pain- pt reports he has to occasionally double up on medication and is worried about the acetaminophen content.  ROS:   See pertinent positives and negatives per HPI.  Patient Active Problem List   Diagnosis Date Noted  . Moderate episode of recurrent major depressive disorder ([place]) 05/13/2018  . Insomnia 03/07/2017  . Hyperlipidemia associated with type 2 diabetes mellitus ([place]) 12/13/2016  . HTN (hypertension) 12/13/2016  . Physical exam 06/01/2016  . BPH (benign prostatic hyperplasia) 09/24/2015  . Low testosterone 09/24/2015  . Chronic joint pain 09/24/2015  . Diabetes mellitus, type 2 ([place]) 11/22/2012  . Morbid obesity with BMI of 50.0-59.9, adult ([place]) 11/22/2012  . Sleep apnea 11/22/2012    Social History    Tobacco Use  . Smoking status: Former Smoker    Packs/day: 1.00    Years: 7.00    Pack years: 7.00    Types: Cigarettes    Quit date: 09/22/2012    Years since quitting: 6.3  . Smokeless tobacco: Never Used  Substance Use Topics  . Alcohol use: Yes    Comment: Rarely    Current Outpatient Medications:  .  albuterol (VENTOLIN HFA) 108 (90 Base) MCG/ACT inhaler, Inhale 1-2 puffs into the lungs every 6 (six) hours as needed for wheezing or shortness of breath., Disp: 6.7 g, Rfl: 0 .  ALPRAZolam (XANAX) 0.5 MG tablet, TAKE 1 TABLET BY MOUTH TWICE A DAY AS NEEDED FOR ANXIETY (Patient taking differently: Take 0.5 mg by mouth 2 (two) times daily as needed for anxiety. ), Disp: 60 tablet, Rfl: 1 .  atorvastatin (LIPITOR) 10 MG tablet, TAKE 1 TABLET BY MOUTH EVERY DAY, Disp: 90 tablet, Rfl: 0 .  buPROPion (WELLBUTRIN XL) 150 MG 24 hr tablet, TAKE 1 TABLET BY MOUTH EVERY DAY, Disp: 90 tablet, Rfl: 0 .  Dulaglutide (TRULICITY) [id number] 0000000 SOPN, Inject 0.75 mg into the skin once a week. (Patient taking differently: Inject 0.75 mg into the skin every Sunday. ), Disp: 4 pen, Rfl: 3 .  FARXIGA 5 MG TABS tablet, TAKE 1 TABLET BY MOUTH EVERY DAY (Patient taking differently: Take 5 mg by mouth daily. ), Disp: 90 tablet, Rfl: 1 .  fenofibrate 160 MG tablet, TAKE 1 TABLET BY MOUTH EVERY DAY,  Disp: 90 tablet, Rfl: 0 .  FLUoxetine (PROZAC) 20 MG tablet, TAKE 1 TABLET BY MOUTH EVERY DAY, Disp: 90 tablet, Rfl: 1 .  fluticasone (FLONASE) 50 MCG/ACT nasal spray, Place 1 spray into both nostrils daily as needed for allergies or rhinitis., Disp: 16 g, Rfl: 0 .  glipiZIDE (GLUCOTROL) 10 MG tablet, TAKE 1 TABLET BY MOUTH EVERY 12 HOURS **NEED OFFICE VISIT**, Disp: 180 tablet, Rfl: 0 .  HYDROcodone-acetaminophen (NORCO/VICODIN) 5-325 MG tablet, Take 1 tablet by mouth every 6 (six) hours as needed for moderate pain., Disp: 120 tablet, Rfl: 0 .  lisinopril (ZESTRIL) 2.5 MG tablet, TAKE 1 TABLET BY MOUTH EVERY DAY AS  DIRECTED, Disp: 90 tablet, Rfl: 0 .  loratadine (CLARITIN) 10 MG tablet, Take 10 mg by mouth daily as needed for allergies. , Disp: , Rfl:  .  meloxicam (MOBIC) 15 MG tablet, TAKE 1 TABLET BY MOUTH EVERY DAY, Disp: 90 tablet, Rfl: 0 .  nitroGLYCERIN (NITROSTAT) 0.4 MG SL tablet, PLACE 1 TABLET (0.4 MG TOTAL) UNDER THE TONGUE EVERY 5 (FIVE) MINUTES AS NEEDED FOR CHEST PAIN., Disp: 75 tablet, Rfl: 1 .  ondansetron (ZOFRAN ODT) 4 MG disintegrating tablet, Take 1 tablet (4 mg total) by mouth every 8 (eight) hours as needed for nausea or vomiting., Disp: 5 tablet, Rfl: 0 .  tamsulosin (FLOMAX) 0.4 MG CAPS capsule, TAKE 1 CAPSULE BY MOUTH EVERY DAY (Patient taking differently: Take 0.4 mg by mouth daily. ), Disp: 90 capsule, Rfl: 1 .  testosterone cypionate (DEPOTESTOTERONE CYPIONATE) 100 MG/ML injection, INJECT 1 ML INTO THE MUSCLE EVERY 14 DAYS (Patient taking differently: Inject 100 mg into the muscle every 14 (fourteen) days. ), Disp: 10 mL, Rfl: 0 .  tiZANidine (ZANAFLEX) 4 MG tablet, TAKE 1 TABLET EVERY 6 HOURS AS NEEDED FOR MUSCLE SPASMS (Patient taking differently: Take 4 mg by mouth every 6 (six) hours as needed for muscle spasms. ), Disp: 45 tablet, Rfl: 3 .  topiramate (TOPAMAX) 50 MG tablet, Take 50 mg by mouth daily. , Disp: , Rfl: 1 .  traZODone (DESYREL) 50 MG tablet, TAKE 1/2 TO 1 TABLET BY MOUTH AT BEDTIME AS NEEDED FOR SLEEP (Patient taking differently: Take 50 mg by mouth at bedtime. ), Disp: 90 tablet, Rfl: 1  Allergies  Allergen Reactions  . Penicillins Anaphylaxis  . Ambien [Zolpidem Tartrate] Other (See Comments)    nightmares  . Cetirizine Other (See Comments)    Makes feel "drunk"  . Gluten Meal Other (See Comments)    Diarrhea and headache  . Lidocaine Swelling and Other (See Comments)    Redness that spreads.  PATIENT CAN USE PRESERVATIVE FREE LIDOCAINE   . Pseudoephedrine Hcl Other (See Comments)    Makes feel "drunk"  . Zolpidem Other (See Comments)    nightmares  .  Latex Itching, Swelling and Rash    Objective:   Temp 98.3 F (36.8 C) (Tympanic)  AAOx3, NAD obese NCAT, EOMI No obvious CN deficits Coloring WNL Pt is able to speak clearly, coherently without shortness of breath or increased work of breathing.  Thought process is linear.  Mood is appropriate.   Assessment and Plan:   DM- chronic problem, tolerating medications w/o difficulty.  UTD on foot exam, eye exam.  On ACE for renal protection.  Again discussed importance of low carb diet- pt is very excited about his meal delivery program (as am I).  Applauded his decision to start walking.  Check labs.  Adjust meds prn   Insomnia- chronic  problem.  Falls asleep easily on trazodone but wakes multiple times each night and is unable to fall back asleep.  Will increase Trazodone to 100mg  nightly and monitor for improvement.  Pt expressed understanding and is in agreement w/ plan.   Chronic pain- reassured pt that taking a 2nd hydrocodone as needed is safe and does not exceed the acetaminophen content.  Will not increase the hydrocodone component as that would be more narcotic at each dose rather than as needed.   Annye Asa, MD 01/14/2019

## 2019-01-14 NOTE — Progress Notes (Signed)
I have discussed the procedure for the virtual visit with the patient who has given consent to proceed with assessment and treatment.   Pt unable to obtain vitals.   Caleb White, CMA     

## 2019-01-16 ENCOUNTER — Other Ambulatory Visit: Payer: Self-pay

## 2019-01-16 ENCOUNTER — Ambulatory Visit (INDEPENDENT_AMBULATORY_CARE_PROVIDER_SITE_OTHER): Payer: Managed Care, Other (non HMO)

## 2019-01-16 DIAGNOSIS — E119 Type 2 diabetes mellitus without complications: Secondary | ICD-10-CM

## 2019-01-17 LAB — HEMOGLOBIN A1C
Hgb A1c MFr Bld: 7.8 % of total Hgb — ABNORMAL HIGH (ref <5.7)
Mean Plasma Glucose: 177 (calc)
eAG (mmol/L): 9.8 (calc)

## 2019-01-17 LAB — BASIC METABOLIC PANEL
BUN: 21 mg/dL (ref 7–25)
CO2: 22 mmol/L (ref 20–32)
Calcium: 9.1 mg/dL (ref 8.6–10.3)
Chloride: 102 mmol/L (ref 98–110)
Creat: 1.14 mg/dL (ref 0.70–1.33)
Glucose, Bld: 246 mg/dL — ABNORMAL HIGH (ref 65–99)
Potassium: 4.3 mmol/L (ref 3.5–5.3)
Sodium: 135 mmol/L (ref 135–146)

## 2019-01-20 ENCOUNTER — Encounter: Payer: Self-pay | Admitting: General Practice

## 2019-01-21 ENCOUNTER — Telehealth: Payer: Self-pay

## 2019-01-21 NOTE — Telephone Encounter (Signed)
LMOVM to schedule 3-4 month DM and Chol f/u per PCP

## 2019-01-27 ENCOUNTER — Encounter: Payer: Self-pay | Admitting: Family Medicine

## 2019-01-27 ENCOUNTER — Other Ambulatory Visit: Payer: Self-pay | Admitting: Family Medicine

## 2019-01-27 DIAGNOSIS — G8929 Other chronic pain: Secondary | ICD-10-CM

## 2019-01-27 DIAGNOSIS — M2550 Pain in unspecified joint: Secondary | ICD-10-CM

## 2019-01-27 NOTE — Telephone Encounter (Signed)
Last OV 01/14/19 Hydrocodone last filled 12/19/18 #120 with 0

## 2019-01-28 MED ORDER — HYDROCODONE-ACETAMINOPHEN 5-325 MG PO TABS: 1.0000 | ORAL_TABLET | Freq: Four times a day (QID) | ORAL | 0 refills | Status: DC | PRN

## 2019-02-06 ENCOUNTER — Encounter: Payer: Self-pay | Admitting: Family Medicine

## 2019-02-18 ENCOUNTER — Ambulatory Visit: Payer: 59 | Attending: Internal Medicine

## 2019-02-18 DIAGNOSIS — Z20822 Contact with and (suspected) exposure to covid-19: Secondary | ICD-10-CM

## 2019-02-19 LAB — NOVEL CORONAVIRUS, NAA: SARS-CoV-2, NAA: NOT DETECTED

## 2019-02-20 ENCOUNTER — Other Ambulatory Visit: Payer: Self-pay | Admitting: Family Medicine

## 2019-02-28 ENCOUNTER — Encounter: Payer: Self-pay | Admitting: Family Medicine

## 2019-02-28 ENCOUNTER — Other Ambulatory Visit: Payer: Self-pay

## 2019-02-28 DIAGNOSIS — G8929 Other chronic pain: Secondary | ICD-10-CM

## 2019-02-28 MED ORDER — HYDROCODONE-ACETAMINOPHEN 5-325 MG PO TABS: 1.0000 | ORAL_TABLET | Freq: Four times a day (QID) | ORAL | 0 refills | Status: DC | PRN

## 2019-02-28 NOTE — Telephone Encounter (Signed)
Last refill: 1.12.21 #120, 0 Last OV: 12.29.20 dx. DM f/u

## 2019-03-02 ENCOUNTER — Other Ambulatory Visit: Payer: Self-pay | Admitting: Family Medicine

## 2019-03-18 ENCOUNTER — Telehealth: Payer: Self-pay

## 2019-03-18 NOTE — Telephone Encounter (Signed)
Dr Carlean Purl what priority level would Caleb White be for his colon cancer screening at the hospital due to his BMI? I will try to get him on for your Tuesday block 04/22/2019 or I could put him on your hospital week 04/14/2019.

## 2019-03-24 ENCOUNTER — Other Ambulatory Visit: Payer: Self-pay | Admitting: Internal Medicine

## 2019-03-24 DIAGNOSIS — Z1211 Encounter for screening for malignant neoplasm of colon: Secondary | ICD-10-CM

## 2019-03-24 NOTE — Telephone Encounter (Signed)
Left Rich a detailed message to confirm he will be in town before I sit his colonoscopy up. April 6th is the Tuesday after Easter. When he calls back I will sit up his pre-visit as well.

## 2019-03-24 NOTE — Telephone Encounter (Signed)
I have spoken with Caleb White and set up his [facility] colonoscopy for 04/22/2019 at 8:30AM, to arrive at 7:00AM. His covid testing appointment is set up for 04/17/2019 at 8:00AM at the [facility]. I have left Caleb White a detailed message with the date and times as he requested.

## 2019-03-24 NOTE — Telephone Encounter (Signed)
Priority 4 April 6

## 2019-04-01 ENCOUNTER — Other Ambulatory Visit: Payer: Self-pay | Admitting: Family Medicine

## 2019-04-08 ENCOUNTER — Other Ambulatory Visit: Payer: Self-pay

## 2019-04-08 ENCOUNTER — Ambulatory Visit (AMBULATORY_SURGERY_CENTER): Payer: Self-pay | Admitting: *Deleted

## 2019-04-08 VITALS — Temp 97.6°F | Ht 66.0 in | Wt 350.8 lb

## 2019-04-08 DIAGNOSIS — Z01818 Encounter for other preprocedural examination: Secondary | ICD-10-CM

## 2019-04-08 DIAGNOSIS — Z1211 Encounter for screening for malignant neoplasm of colon: Secondary | ICD-10-CM

## 2019-04-08 NOTE — Progress Notes (Signed)
Patient is here in-person for PV. Patient denies any allergies to eggs or soy. Patient denies any problems with anesthesia/sedation. Patient denies any oxygen use at home. Patient denies taking any diet/weight loss medications or blood thinners. Patient is not being treated for MRSA or C-diff. EMMI education assisgned to the patient for the procedure, this was explained and instructions given to patient. COVID-19 screening test is on 4/2 per patient's request, the pt is aware.  Patient is aware of our care-partner policy and 0000000 safety protocol.

## 2019-04-09 ENCOUNTER — Encounter: Payer: Self-pay | Admitting: Family Medicine

## 2019-04-09 DIAGNOSIS — G8929 Other chronic pain: Secondary | ICD-10-CM

## 2019-04-09 MED ORDER — HYDROCODONE-ACETAMINOPHEN 5-325 MG PO TABS: 1.0000 | ORAL_TABLET | Freq: Four times a day (QID) | ORAL | 0 refills | Status: DC | PRN

## 2019-04-09 NOTE — Telephone Encounter (Signed)
Last OV 01/14/19 Hydrocodone last filled 02/28/19 #120 with 0

## 2019-04-16 ENCOUNTER — Other Ambulatory Visit: Payer: Self-pay | Admitting: Family Medicine

## 2019-04-16 DIAGNOSIS — R7989 Other specified abnormal findings of blood chemistry: Secondary | ICD-10-CM

## 2019-04-16 NOTE — Telephone Encounter (Signed)
Will leave this for PCP to refill since last testosterone 2017.

## 2019-04-16 NOTE — Telephone Encounter (Signed)
Testosterone last rx 07/26/17 Last PSA: 0.07 on 10/15/18 Last Testosterone: 175 on 10/22/15 LOV: 01/14/19  DM

## 2019-04-17 ENCOUNTER — Other Ambulatory Visit (HOSPITAL_COMMUNITY): Payer: 59

## 2019-04-18 ENCOUNTER — Other Ambulatory Visit (HOSPITAL_COMMUNITY)
Admission: RE | Admit: 2019-04-18 | Discharge: 2019-04-18 | Disposition: A | Discharge status: Home / Self Care | Payer: 59 | Source: Ambulatory Visit | Attending: Internal Medicine | Admitting: Internal Medicine

## 2019-04-18 ENCOUNTER — Other Ambulatory Visit (HOSPITAL_COMMUNITY): Payer: 59

## 2019-04-18 DIAGNOSIS — Z01812 Encounter for preprocedural laboratory examination: Secondary | ICD-10-CM | POA: Insufficient documentation

## 2019-04-18 DIAGNOSIS — Z20822 Contact with and (suspected) exposure to covid-19: Secondary | ICD-10-CM | POA: Insufficient documentation

## 2019-04-18 LAB — SARS CORONAVIRUS 2 (TAT 6-24 HRS): SARS Coronavirus 2: NEGATIVE

## 2019-04-21 NOTE — Telephone Encounter (Signed)
He is due for a diabetes follow up and we need to get an updated testosterone level (last was 2017).  Will decline for now

## 2019-04-22 ENCOUNTER — Ambulatory Visit (HOSPITAL_COMMUNITY)
Admission: RE | Admit: 2019-04-22 | Discharge: 2019-04-22 | Disposition: A | Discharge status: Home / Self Care | Payer: 59 | Attending: Internal Medicine | Admitting: Internal Medicine

## 2019-04-22 ENCOUNTER — Ambulatory Visit (HOSPITAL_COMMUNITY): Payer: 59 | Admitting: Anesthesiology

## 2019-04-22 ENCOUNTER — Encounter (HOSPITAL_COMMUNITY): Payer: Self-pay | Admitting: Internal Medicine

## 2019-04-22 ENCOUNTER — Other Ambulatory Visit: Payer: Self-pay

## 2019-04-22 ENCOUNTER — Encounter (HOSPITAL_COMMUNITY)
Admission: RE | Disposition: A | Discharge status: Home / Self Care | Payer: Self-pay | Source: Home / Self Care | Attending: Internal Medicine

## 2019-04-22 DIAGNOSIS — Z87891 Personal history of nicotine dependence: Secondary | ICD-10-CM | POA: Diagnosis not present

## 2019-04-22 DIAGNOSIS — G4733 Obstructive sleep apnea (adult) (pediatric): Secondary | ICD-10-CM | POA: Diagnosis not present

## 2019-04-22 DIAGNOSIS — Z79890 Hormone replacement therapy (postmenopausal): Secondary | ICD-10-CM | POA: Diagnosis not present

## 2019-04-22 DIAGNOSIS — K635 Polyp of colon: Secondary | ICD-10-CM | POA: Diagnosis not present

## 2019-04-22 DIAGNOSIS — E785 Hyperlipidemia, unspecified: Secondary | ICD-10-CM | POA: Diagnosis not present

## 2019-04-22 DIAGNOSIS — Z791 Long term (current) use of non-steroidal anti-inflammatories (NSAID): Secondary | ICD-10-CM | POA: Diagnosis not present

## 2019-04-22 DIAGNOSIS — Z1211 Encounter for screening for malignant neoplasm of colon: Secondary | ICD-10-CM | POA: Diagnosis not present

## 2019-04-22 DIAGNOSIS — F329 Major depressive disorder, single episode, unspecified: Secondary | ICD-10-CM | POA: Insufficient documentation

## 2019-04-22 DIAGNOSIS — Z79899 Other long term (current) drug therapy: Secondary | ICD-10-CM | POA: Insufficient documentation

## 2019-04-22 DIAGNOSIS — Z7984 Long term (current) use of oral hypoglycemic drugs: Secondary | ICD-10-CM | POA: Insufficient documentation

## 2019-04-22 DIAGNOSIS — D125 Benign neoplasm of sigmoid colon: Secondary | ICD-10-CM

## 2019-04-22 DIAGNOSIS — K5730 Diverticulosis of large intestine without perforation or abscess without bleeding: Secondary | ICD-10-CM | POA: Diagnosis not present

## 2019-04-22 DIAGNOSIS — I10 Essential (primary) hypertension: Secondary | ICD-10-CM | POA: Insufficient documentation

## 2019-04-22 DIAGNOSIS — E119 Type 2 diabetes mellitus without complications: Secondary | ICD-10-CM | POA: Diagnosis not present

## 2019-04-22 HISTORY — PX: COLONOSCOPY WITH PROPOFOL: SHX5780

## 2019-04-22 HISTORY — PX: POLYPECTOMY: SHX5525

## 2019-04-22 LAB — GLUCOSE, CAPILLARY: Glucose-Capillary: 155 mg/dL — ABNORMAL HIGH (ref 70–99)

## 2019-04-22 SURGERY — COLONOSCOPY WITH PROPOFOL
Anesthesia: Monitor Anesthesia Care

## 2019-04-22 MED ORDER — PROPOFOL 10 MG/ML IV BOLUS
INTRAVENOUS | Status: AC
  Filled 2019-04-22: qty 20

## 2019-04-22 MED ORDER — LACTATED RINGERS IV SOLN
INTRAVENOUS | Status: DC
  Administered 2019-04-22: 1000 mL via INTRAVENOUS

## 2019-04-22 MED ORDER — PROPOFOL 500 MG/50ML IV EMUL
INTRAVENOUS | Status: AC
  Filled 2019-04-22: qty 50

## 2019-04-22 MED ORDER — LIDOCAINE HCL 1 % IJ SOLN
INTRAMUSCULAR | Status: DC | PRN
  Administered 2019-04-22: 40 mg via INTRADERMAL

## 2019-04-22 MED ORDER — SODIUM CHLORIDE 0.9 % IV SOLN: INTRAVENOUS | Status: DC

## 2019-04-22 MED ORDER — PROPOFOL 500 MG/50ML IV EMUL
INTRAVENOUS | Status: DC | PRN
  Administered 2019-04-22: 25 ug/kg/min via INTRAVENOUS

## 2019-04-22 SURGICAL SUPPLY — 22 items

## 2019-04-22 NOTE — Op Note (Signed)
[facility] Patient Name: Caleb White Procedure Date: 04/22/2019 MRN: [id number] Attending MD: Gatha Mayer , MD Date of Birth: 1965/09/29 CSN: [id number] Age: 54 Admit Type: Outpatient Procedure:                Colonoscopy Indications:              Screening for colorectal malignant neoplasm, This                            is the patient's first colonoscopy Providers:                Gatha Mayer, MD, Glori Bickers, RN, Theodora Blow,                            Technician Referring MD:             Aundra Millet. Birdie Riddle, MD Medicines:                Propofol per Anesthesia, Monitored Anesthesia Care Complications:            No immediate complications. Estimated Blood Loss:     Estimated blood loss was minimal. Procedure:                Pre-Anesthesia Assessment:                           - Prior to the procedure, a History and Physical                            was performed, and patient medications and                            allergies were reviewed. The patient's tolerance of                            previous anesthesia was also reviewed. The risks                            and benefits of the procedure and the sedation                            options and risks were discussed with the patient.                            All questions were answered, and informed consent                            was obtained. Prior Anticoagulants: The patient has                            taken no previous anticoagulant or antiplatelet                            agents. ASA Grade Assessment: III - A patient with  severe systemic disease. After reviewing the risks                            and benefits, the patient was deemed in                            satisfactory condition to undergo the procedure.                           After obtaining informed consent, the colonoscope                            was passed under direct vision.  Throughout the                            procedure, the patient's blood pressure, pulse, and                            oxygen saturations were monitored continuously. The                            [id number]) Olympus colonoscope was                            introduced through the anus and advanced to the the                            cecum, identified by appendiceal orifice and                            ileocecal valve. The colonoscopy was performed                            without difficulty. The patient tolerated the                            procedure well. The quality of the bowel                            preparation was excellent. The ileocecal valve,                            appendiceal orifice, and rectum were photographed.                            The bowel preparation used was Miralax via split                            dose instruction. Scope In: 8:57:26 AM Scope Out: 9:12:47 AM Scope Withdrawal Time: 0 hours 10 minutes 41 seconds  Total Procedure Duration: 0 hours 15 minutes 21 seconds  Findings:      The perianal and digital rectal examinations were normal. Pertinent       negatives include normal prostate (size, shape, and consistency).  A diminutive polyp was found in the sigmoid colon. The polyp was       sessile. The polyp was removed with a cold snare. Resection and       retrieval were complete. Verification of patient identification for the       specimen was done. Estimated blood loss was minimal.      A few small-mouthed diverticula were found in the sigmoid colon.      The exam was otherwise without abnormality on direct and retroflexion       views. Impression:               - One diminutive polyp in the sigmoid colon,                            removed with a cold snare. Resected and retrieved.                           - Diverticulosis in the sigmoid colon.                           - The examination was otherwise normal on direct                             and retroflexion views. Moderate Sedation:      Not Applicable - Patient had care per Anesthesia. Recommendation:           - Patient has a contact number available for                            emergencies. The signs and symptoms of potential                            delayed complications were discussed with the                            patient. Return to normal activities tomorrow.                            Written discharge instructions were provided to the                            patient.                           - Resume previous diet.                           - Continue present medications.                           - Repeat colonoscopy is recommended. The                            colonoscopy date will be determined after pathology                            results  from today's exam become available for                            review. Procedure Code(s):        --- Professional ---                           [phone], Colonoscopy, flexible; with removal of                            tumor(s), polyp(s), or other lesion(s) by snare                            technique Diagnosis Code(s):        --- Professional ---                           Z12.11, Encounter for screening for malignant                            neoplasm of colon                           K63.5, Polyp of colon                           K57.30, Diverticulosis of large intestine without                            perforation or abscess without bleeding CPT copyright 2019 American [employer] Association. All rights reserved. The codes documented in this report are preliminary and upon coder review may  be revised to meet current compliance requirements. Gatha Mayer, MD 04/22/2019 9:25:05 AM This report has been signed electronically. Number of Addenda: 0

## 2019-04-22 NOTE — Anesthesia Postprocedure Evaluation (Signed)
Anesthesia Post Note  Patient: Caleb White.  Procedure(s) Performed: COLONOSCOPY WITH PROPOFOL (N/A ) POLYPECTOMY     Patient location during evaluation: Endoscopy Anesthesia Type: MAC Level of consciousness: awake and alert Pain management: pain level controlled Vital Signs Assessment: post-procedure vital signs reviewed and stable Respiratory status: spontaneous breathing, nonlabored ventilation and respiratory function stable Cardiovascular status: blood pressure returned to baseline and stable Postop Assessment: no apparent nausea or vomiting Anesthetic complications: no    Last Vitals:  Vitals:   04/22/19 0921 04/22/19 0930  BP: (!) 142/66 131/78  Pulse: (!) 56 63  Resp: 19 19  Temp: 36.7 C   SpO2: 100% 100%    Last Pain:  Vitals:   04/22/19 0930  TempSrc:   PainSc: 0-No pain                 Lidia Collum

## 2019-04-22 NOTE — Transfer of Care (Signed)
Immediate Anesthesia Transfer of Care Note  Patient: Caleb White.  Procedure(s) Performed: COLONOSCOPY WITH PROPOFOL (N/A ) POLYPECTOMY   Patient Location: PACU and Endoscopy Unit  Anesthesia Type:MAC  Level of Consciousness: awake, alert , oriented and patient cooperative  Airway & Oxygen Therapy: Patient Spontanous Breathing and Patient connected to face mask oxygen  Post-op Assessment: Report given to RN and Post -op Vital signs reviewed and stable  Post vital signs: Reviewed and stable  Last Vitals:  Vitals Value Taken Time  BP 142/66 04/22/19 0920  Temp    Pulse 60 04/22/19 0920  Resp 15 04/22/19 0920  SpO2 100 % 04/22/19 0920  Vitals shown include unvalidated device data.  Last Pain:  Vitals:   04/22/19 0744  TempSrc: Oral  PainSc: 0-No pain         Complications: No apparent anesthesia complications

## 2019-04-22 NOTE — Anesthesia Preprocedure Evaluation (Addendum)
Anesthesia Evaluation  Patient identified by MRN, date of birth, ID band Patient awake    Reviewed: Allergy & Precautions, NPO status , Patient's Chart, lab work & pertinent test results  History of Anesthesia Complications Negative for: history of anesthetic complications  Airway Mallampati: IV  TM Distance: >3 FB Neck ROM: Full    Dental  (+) Teeth Intact   Pulmonary sleep apnea and Continuous Positive Airway Pressure Ventilation , former smoker,    Pulmonary exam normal        Cardiovascular hypertension, Normal cardiovascular exam     Neuro/Psych negative neurological ROS  negative psych ROS   GI/Hepatic Neg liver ROS, PUD,   Endo/Other  diabetesMorbid obesity  Renal/GU negative Renal ROS  negative genitourinary   Musculoskeletal negative musculoskeletal ROS (+)   Abdominal   Peds  Hematology negative hematology ROS (+)   Anesthesia Other Findings   Reproductive/Obstetrics                            Anesthesia Physical Anesthesia Plan  ASA: III  Anesthesia Plan: MAC   Post-op Pain Management:    Induction: Intravenous  PONV Risk Score and Plan: 1 and Propofol infusion, TIVA and Treatment may vary due to age or medical condition  Airway Management Planned: Natural Airway, Nasal Cannula and Simple Face Mask  Additional Equipment: None  Intra-op Plan:   Post-operative Plan:   Informed Consent: I have reviewed the patients History and Physical, chart, labs and discussed the procedure including the risks, benefits and alternatives for the proposed anesthesia with the patient or authorized representative who has indicated his/her understanding and acceptance.       Plan Discussed with:   Anesthesia Plan Comments:         Anesthesia Quick Evaluation

## 2019-04-22 NOTE — Discharge Instructions (Addendum)
I found and removed one tiny polyp that looks benign and saw some diverticulosis (common issue).  I will let you know pathology results and when to have another routine colonoscopy by mail and/or My Chart.   I appreciate the opportunity to care for you. Gatha Mayer, MD, FACG    YOU HAD AN ENDOSCOPIC PROCEDURE TODAY: Refer to the procedure report and other information in the discharge instructions given to you for any specific questions about what was found during the examination. If this information does not answer your questions, please call [place] office at [phone] to clarify.   YOU SHOULD EXPECT: Some feelings of bloating in the abdomen. Passage of more gas than usual. Walking can help get rid of the air that was put into your GI tract during the procedure and reduce the bloating. If you had a lower endoscopy (such as a colonoscopy or flexible sigmoidoscopy) you may notice spotting of blood in your stool or on the toilet paper. Some abdominal soreness may be present for a day or two, also.  DIET: Your first meal following the procedure should be a light meal and then it is ok to progress to your normal diet. A half-sandwich or bowl of soup is an example of a good first meal. Heavy or fried foods are harder to digest and may make you feel nauseous or bloated. Drink plenty of fluids but you should avoid alcoholic beverages for 24 hours. If you had a esophageal dilation, please see attached instructions for diet.    ACTIVITY: Your care partner should take you home directly after the procedure. You should plan to take it easy, moving slowly for the rest of the day. You can resume normal activity the day after the procedure however YOU SHOULD NOT DRIVE, use power tools, machinery or perform tasks that involve climbing or major physical exertion for 24 hours (because of the sedation medicines used during the test).   SYMPTOMS TO REPORT IMMEDIATELY: A gastroenterologist can be reached at any  hour. Please call [phone]  for any of the following symptoms:  Following lower endoscopy (colonoscopy, flexible sigmoidoscopy) Excessive amounts of blood in the stool  Significant tenderness, worsening of abdominal pains  Swelling of the abdomen that is new, acute  Fever of 100 or higher  Following upper endoscopy (EGD, EUS, ERCP, esophageal dilation) Vomiting of blood or coffee ground material  New, significant abdominal pain  New, significant chest pain or pain under the shoulder blades  Painful or persistently difficult swallowing  New shortness of breath  Black, tarry-looking or red, bloody stools  FOLLOW UP:  If any biopsies were taken you will be contacted by phone or by letter within the next 1-3 weeks. Call [phone]  if you have not heard about the biopsies in 3 weeks.  Please also call with any specific questions about appointments or follow up tests.

## 2019-04-22 NOTE — H&P (Signed)
[facility] History and Physical   Primary Care Physician:  [name], MD   Reason for Procedure:   colon cancer screening  Plan:    colonoscopy     HPI: Caleb White. is a 54 y.o. male here for screening colonoscopy   Past Medical History:  Diagnosis Date  . Arthritis    per patient  . Back injury    per patient, history of ruptured or torn disk   . Depression   . Diabetes mellitus without complication ([place])   . Foot deformity   . Gluten intolerance    self diagnosed, no h/o biopsy or blood testing  . Hyperlipidemia   . Low testosterone   . Morbid obesity with BMI of 50.0-59.9, adult ([place])   . OSA on CPAP   . Peptic ulcer   . Shortness of breath   . Sleep apnea    on CPAP  . Spinal stenosis     Past Surgical History:  Procedure Laterality Date  . BLADDER SURGERY     at 54 years old, patient unaware of details  . COLONOSCOPY  2000?  . ESOPHAGOGASTRODUODENOSCOPY    . VASECTOMY  2019    Prior to Admission medications   Medication Sig Start Date End Date Taking? Authorizing Provider  albuterol (VENTOLIN HFA) 108 (90 Base) MCG/ACT inhaler Inhale 1-2 puffs into the lungs every 6 (six) hours as needed for wheezing or shortness of breath. 12/13/18  Yes Petrucelli, Samantha R, PA-C  ALPRAZolam (XANAX) 0.5 MG tablet TAKE 1 TABLET BY MOUTH TWICE A DAY AS NEEDED FOR ANXIETY Patient taking differently: Take 0.5 mg by mouth 2 (two) times daily as needed for anxiety.  11/06/18  Yes [name], MD  atorvastatin (LIPITOR) 10 MG tablet TAKE 1 TABLET BY MOUTH EVERY DAY Patient taking differently: Take 10 mg by mouth daily.  04/01/19  Yes [name], MD  buPROPion (WELLBUTRIN XL) 150 MG 24 hr tablet TAKE 1 TABLET BY MOUTH EVERY DAY Patient taking differently: Take 150 mg by mouth daily.  04/01/19  Yes [name], MD  FARXIGA 5 MG TABS tablet TAKE 1 TABLET BY MOUTH EVERY DAY Patient taking differently: Take 5 mg by mouth  daily.  11/23/18  Yes [name], MD  fenofibrate 160 MG tablet TAKE 1 TABLET BY MOUTH EVERY DAY Patient taking differently: Take 160 mg by mouth daily.  03/03/19  Yes [name], MD  Flaxseed, Linseed, (FLAX SEED OIL) 1000 MG CAPS Take 1,000 mg by mouth 3 (three) times a week.   Yes [provider]  FLUoxetine (PROZAC) 20 MG tablet TAKE 1 TABLET BY MOUTH EVERY DAY Patient taking differently: Take 20 mg by mouth daily.  01/13/19  Yes [name], MD  fluticasone (FLONASE) 50 MCG/ACT nasal spray Place 1 spray into both nostrils daily as needed for allergies or rhinitis. 12/13/18  Yes Petrucelli, Samantha R, PA-C  glipiZIDE (GLUCOTROL) 10 MG tablet TAKE 1 TABLET BY MOUTH EVERY 12 HOURS **NEED OFFICE VISIT** Patient taking differently: Take 10 mg by mouth in the morning and at bedtime.  04/01/19  Yes [name], MD  HYDROcodone-acetaminophen (NORCO/VICODIN) 5-325 MG tablet Take 1 tablet by mouth every 6 (six) hours as needed for moderate pain. Patient taking differently: Take 1-2 tablets by mouth every 6 (six) hours as needed for moderate pain.  04/09/19  Yes [name], MD  lisinopril (ZESTRIL) 2.5 MG tablet TAKE 1 TABLET BY MOUTH EVERY  DAY AS DIRECTED Patient taking differently: Take 2.5 mg by mouth daily.  04/01/19  Yes [name], MD  loratadine (CLARITIN) 10 MG tablet Take 10 mg by mouth daily.    Yes [provider]  meloxicam (MOBIC) 15 MG tablet TAKE 1 TABLET BY MOUTH EVERY DAY Patient taking differently: Take 15 mg by mouth daily.  04/01/19  Yes [name], MD  omega-3 acid ethyl esters (LOVAZA) 1 g capsule Take 1 g by mouth 3 (three) times a week.   Yes [provider]  tamsulosin (FLOMAX) 0.4 MG CAPS capsule TAKE 1 CAPSULE BY MOUTH EVERY DAY Patient taking differently: Take 0.4 mg by mouth daily.  11/23/18  Yes [name], MD  tiZANidine (ZANAFLEX) 4 MG tablet TAKE 1 TABLET EVERY 6 HOURS AS NEEDED  FOR MUSCLE SPASMS Patient taking differently: Take 4 mg by mouth daily as needed for muscle spasms.  10/03/18  Yes [name], MD  traZODone (DESYREL) 100 MG tablet TAKE 1 TABLET BY MOUTH EVERYDAY AT BEDTIME Patient taking differently: Take 100 mg by mouth at bedtime.  01/27/19  Yes [name], MD  TRULICITY [id number] SOPN INJECT 0.75 MG INTO THE SKIN ONCE A WEEK. 02/20/19  Yes [name], MD  nitroGLYCERIN (NITROSTAT) 0.4 MG SL tablet PLACE 1 TABLET (0.4 MG TOTAL) UNDER THE TONGUE EVERY 5 (FIVE) MINUTES AS NEEDED FOR CHEST PAIN. 11/04/18   [name], MD  ondansetron (ZOFRAN ODT) 4 MG disintegrating tablet Take 1 tablet (4 mg total) by mouth every 8 (eight) hours as needed for nausea or vomiting. 12/13/18   Petrucelli, Samantha R, PA-C  testosterone cypionate (DEPOTESTOTERONE CYPIONATE) 100 MG/ML injection INJECT 1 ML INTO THE MUSCLE EVERY 14 DAYS Patient taking differently: Inject 100 mg into the muscle every 14 (fourteen) days.  07/26/17   [name], MD    Current Facility-Administered Medications  Medication Dose Route Frequency Provider Last Rate Last Admin  . 0.9 %  sodium chloride infusion   Intravenous Continuous Gatha Mayer, MD      . lactated ringers infusion   Intravenous Continuous Gatha Mayer, MD 125 mL/hr at 04/22/19 0803 1,000 mL at 04/22/19 0803    Allergies as of 03/24/2019 - Review Complete 01/14/2019  Allergen Reaction Noted  . Penicillins Anaphylaxis 11/22/2012  . Ambien [zolpidem tartrate] Other (See Comments) 07/08/2015  . Cetirizine Other (See Comments) 06/13/2013  . Gluten meal Other (See Comments) 11/22/2012  . Lidocaine Swelling and Other (See Comments) 04/26/2016  . Pseudoephedrine hcl Other (See Comments) 06/13/2013  . Zolpidem Other (See Comments) 04/12/2015  . Latex Itching, Swelling, and Rash 07/08/2015    Family History  Problem Relation Age of Onset  . Heart attack Mother   . Heart failure Mother    . Diabetes Mother   . Heart attack Father   . Hypertension Father   . Colon polyps Father   . Heart disease Brother   . Cancer Brother   . Stroke Brother   . Bladder Cancer Brother   . Diabetes Brother   . Clotting disorder Paternal Aunt        Blood clot after minor surgery  . Breast cancer Paternal Aunt   . Clotting disorder Paternal Grandmother        Blood clot after minor surgery  . Clotting disorder Maternal Grandmother   . Cancer Maternal Grandmother   . Diabetes Maternal Grandfather   . Alcohol abuse Paternal Grandfather   .  Colon cancer Neg Hx   . Esophageal cancer Neg Hx   . Rectal cancer Neg Hx       Review of Systems:  All other review of systems negative except as mentioned in the HPI.  Physical Exam: Vital signs in last 24 hours: Temp:  [98.7 F (37.1 C)] 98.7 F (37.1 C) (04/06 0744) Pulse Rate:  [70] 70 (04/06 0744) Resp:  [22] 22 (04/06 0744) BP: (149)/(70) 149/70 (04/06 0744) SpO2:  [100 %] 100 % (04/06 0744) Weight:  [158.8 kg] 158.8 kg (04/06 0744)   General:   Alert,  Well-developed, well-nourished, obese pleasant and cooperative in NAD Lungs:  Clear throughout to auscultation.   Heart:  Regular rate and rhythm; no murmurs, clicks, rubs,  or gallops. Abdomen:  Soft, nontender and nondistended. Normal bowel sounds.  obese Neuro/Psych:  Alert and cooperative. Normal mood and affect. A and O x 3   @Clyda Smyth  Simonne Maffucci, MD, [facility] [phone] (pager) 04/22/2019 8:39 AM@

## 2019-04-23 LAB — SURGICAL PATHOLOGY

## 2019-04-24 ENCOUNTER — Encounter: Payer: Self-pay | Admitting: Internal Medicine

## 2019-05-03 ENCOUNTER — Other Ambulatory Visit: Payer: Self-pay | Admitting: Family Medicine

## 2019-05-13 ENCOUNTER — Encounter: Payer: Self-pay | Admitting: Family Medicine

## 2019-05-13 ENCOUNTER — Other Ambulatory Visit: Payer: Self-pay

## 2019-05-13 DIAGNOSIS — M2550 Pain in unspecified joint: Secondary | ICD-10-CM

## 2019-05-13 DIAGNOSIS — G8929 Other chronic pain: Secondary | ICD-10-CM

## 2019-05-13 NOTE — Telephone Encounter (Signed)
Last refill: 04/09/19 #120, 0 Last OV: 01/14/2019 dx. DM f/u

## 2019-05-14 MED ORDER — HYDROCODONE-ACETAMINOPHEN 5-325 MG PO TABS: 1.0000 | ORAL_TABLET | Freq: Four times a day (QID) | ORAL | 0 refills | Status: DC | PRN

## 2019-05-25 ENCOUNTER — Other Ambulatory Visit: Payer: Self-pay | Admitting: Family Medicine

## 2019-05-26 ENCOUNTER — Other Ambulatory Visit: Payer: Self-pay | Admitting: Family Medicine

## 2019-06-11 ENCOUNTER — Other Ambulatory Visit: Payer: Self-pay | Admitting: Family Medicine

## 2019-06-11 NOTE — Telephone Encounter (Signed)
Tizanidine last filled 10/03/18 #45 with 3 refills LOV 01/14/19 NOV none

## 2019-06-19 ENCOUNTER — Encounter: Payer: Self-pay | Admitting: Family Medicine

## 2019-06-19 DIAGNOSIS — G8929 Other chronic pain: Secondary | ICD-10-CM

## 2019-06-19 DIAGNOSIS — M2550 Pain in unspecified joint: Secondary | ICD-10-CM

## 2019-06-19 MED ORDER — HYDROCODONE-ACETAMINOPHEN 5-325 MG PO TABS: 1.0000 | ORAL_TABLET | Freq: Four times a day (QID) | ORAL | 0 refills | Status: DC | PRN

## 2019-06-19 NOTE — Telephone Encounter (Signed)
Last OV 01/14/19 Hydrocodone last filled 05/14/19 #120 with 0

## 2019-07-03 ENCOUNTER — Other Ambulatory Visit: Payer: Self-pay | Admitting: Family Medicine

## 2019-07-03 NOTE — Telephone Encounter (Signed)
Last OV 01/14/19 Alprazolam last filled 11/06/18 #60 with 1

## 2019-08-07 ENCOUNTER — Encounter: Payer: Self-pay | Admitting: Family Medicine

## 2019-08-07 DIAGNOSIS — M2550 Pain in unspecified joint: Secondary | ICD-10-CM

## 2019-08-07 DIAGNOSIS — G8929 Other chronic pain: Secondary | ICD-10-CM

## 2019-08-07 MED ORDER — HYDROCODONE-ACETAMINOPHEN 5-325 MG PO TABS: 1.0000 | ORAL_TABLET | Freq: Four times a day (QID) | ORAL | 0 refills | Status: DC | PRN

## 2019-08-07 NOTE — Telephone Encounter (Signed)
Last OV 01/14/19 Hydrocodone last filled 06/19/19 #120 with 0

## 2019-09-08 MED ORDER — HYDROCODONE-ACETAMINOPHEN 5-325 MG PO TABS: 1.0000 | ORAL_TABLET | Freq: Four times a day (QID) | ORAL | 0 refills | Status: DC | PRN

## 2019-09-08 NOTE — Addendum Note (Signed)
Addended by: [name] on: 09/08/2019 12:07 PM   Modules accepted: Orders

## 2019-09-10 ENCOUNTER — Telehealth: Payer: Self-pay | Admitting: Family Medicine

## 2019-09-10 NOTE — Telephone Encounter (Signed)
Please advise 

## 2019-09-10 NOTE — Telephone Encounter (Signed)
Pt called in stating that he was going to apply for a concealed carry permit. He states that he is taking xanax, hydrocodone, and trazodone. He wanted to know if this could keep him from getting this permit and if so is something that he can stop taking since he is using them on a PRN basis?  Pt can be reached at the home # and he said it is ok to LM if no answer.

## 2019-09-10 NOTE — Telephone Encounter (Signed)
I find it curious that he says he's taking his Hydrocodone PRN b/c he requests it monthly like clockwork.  I would be thrilled if he was able to come off these medications.  I don't have any control over who the state gives permits to for concealed carry, but I would be concerned about someone on narcotics and benzos carrying a weapon as these both alter cognition

## 2019-09-11 NOTE — Telephone Encounter (Signed)
Called pt. Could not leave a message.

## 2019-09-12 NOTE — Telephone Encounter (Signed)
Called and LMOVM to have pt return call to office.

## 2019-09-15 NOTE — Telephone Encounter (Signed)
Called pt 2 times. Closing encounter until pt returns call.

## 2019-09-24 ENCOUNTER — Ambulatory Visit (HOSPITAL_COMMUNITY)
Admission: EM | Admit: 2019-09-24 | Discharge: 2019-09-24 | Disposition: A | Discharge status: Home / Self Care | Payer: 59 | Attending: Family Medicine | Admitting: Family Medicine

## 2019-09-24 ENCOUNTER — Other Ambulatory Visit: Payer: Self-pay

## 2019-09-24 ENCOUNTER — Encounter (HOSPITAL_COMMUNITY): Payer: Self-pay

## 2019-09-24 DIAGNOSIS — J069 Acute upper respiratory infection, unspecified: Secondary | ICD-10-CM | POA: Insufficient documentation

## 2019-09-24 DIAGNOSIS — Z20822 Contact with and (suspected) exposure to covid-19: Secondary | ICD-10-CM | POA: Diagnosis not present

## 2019-09-24 DIAGNOSIS — R5383 Other fatigue: Secondary | ICD-10-CM | POA: Diagnosis present

## 2019-09-24 NOTE — ED Triage Notes (Signed)
Pt c/o fatigue onset 09/10/19. HA, low-grade fever, nausea, runny nose onset approx 3 days ago. Denies sore throat, congestion, cough, SOB, CP, ear pain.    States HA and nausea improved, but wants COVID testing.

## 2019-09-24 NOTE — Discharge Instructions (Addendum)
You have been tested for COVID-19 today. °If your test returns positive, you will receive a phone call from [facility] regarding your results. °Negative test results are not called. °Both positive and negative results area always visible on MyChart. °If you do not have a MyChart account, sign up instructions are provided in your discharge papers. °Please do not hesitate to contact us should you have questions or concerns. ° °

## 2019-09-25 LAB — SARS CORONAVIRUS 2 (TAT 6-24 HRS): SARS Coronavirus 2: NEGATIVE

## 2019-09-26 ENCOUNTER — Other Ambulatory Visit: Payer: Self-pay | Admitting: Family Medicine

## 2019-09-28 ENCOUNTER — Other Ambulatory Visit: Payer: Self-pay | Admitting: Family Medicine

## 2019-09-29 NOTE — ED Provider Notes (Signed)
[facility]   [id number] 09/24/19 Arrival Time: 20  ASSESSMENT & PLAN:  1. Viral URI   2. Fatigue, unspecified type      COVID-19 testing sent. See letter/work note on file for self-isolation guidelines. OTC symptom care as needed.   Follow-up Information    [name], MD.   Specialty: Family Medicine Why: As needed. Contact information: [address] [place] [address] [phone]               Reviewed expectations re: course of current medical issues. Questions answered. Outlined signs and symptoms indicating need for more acute intervention. Understanding verbalized. After Visit Summary given.   SUBJECTIVE: History from: patient. Caleb White. is a 54 y.o. male who reports HA, subj fever, runny nose, fatigue for 2-3 days. Known COVID-19 contact: none. Recent travel: none. Denies: difficulty breathing. Normal PO intake without n/v/d.    OBJECTIVE:  Vitals:   09/24/19 1911  BP: 121/83  Pulse: 72  Resp: 16  Temp: 98.3 F (36.8 C)  TempSrc: Oral  SpO2: 99%    General appearance: alert; no distress Eyes: PERRLA; EOMI; conjunctiva normal HENT: [place]; AT; nasal congestion Neck: supple  Lungs: speaks full sentences without difficulty; unlabored Extremities: no edema Skin: warm and dry Neurologic: normal gait Psychological: alert and cooperative; normal mood and affect  Labs:  Labs Reviewed  SARS CORONAVIRUS 2 (TAT 6-24 HRS)      Allergies  Allergen Reactions   Penicillins Anaphylaxis    Did it involve swelling of the face/tongue/throat, SOB, or low BP? No Did it involve sudden or severe rash/hives, skin peeling, or any reaction on the inside of your mouth or nose? no Did you need to seek medical attention at a hospital or doctor's office? No When did it last happen?Middle school If all above answers are NO, may proceed with cephalosporin use.   Ambien [Zolpidem Tartrate] Other (See Comments)      nightmares   Cetirizine Other (See Comments)    Makes feel "drunk"   Gluten Meal Other (See Comments)    Diarrhea and headache   Lidocaine Swelling and Other (See Comments)    Redness that spreads.  PATIENT CAN USE PRESERVATIVE FREE LIDOCAINE Non preservative there is no problem   Pseudoephedrine Hcl Other (See Comments)    Makes feel "drunk"   Zolpidem Other (See Comments)    nightmares   Latex Itching, Swelling and Rash    Past Medical History:  Diagnosis Date   Arthritis    per patient   Back injury    per patient, history of ruptured or torn disk    Depression    Diabetes mellitus without complication ([facility])    Foot deformity    Gluten intolerance    self diagnosed, no h/o biopsy or blood testing   Hyperlipidemia    Low testosterone    Morbid obesity with BMI of 50.0-59.9, adult ([facility])    OSA on CPAP    Peptic ulcer    Shortness of breath    Sleep apnea    on CPAP   Spinal stenosis    Social History   Socioeconomic History   Marital status: Single    Spouse name: Anitra Lauth   Number of children: 3   Years of education: college   Highest education level: Not on file  Occupational History   Occupation: Sign Nutritional therapist  Tobacco Use   Smoking status: Former Smoker    Packs/day:  1.00    Years: 7.00    Pack years: 7.00    Types: Cigarettes    Quit date: 09/22/2012    Years since quitting: 7.0   Smokeless tobacco: Never Used  Vaping Use   Vaping Use: Never used  Substance and Sexual Activity   Alcohol use: Yes    Comment: Rarely   Drug use: No   Sexual activity: Yes    Partners: Female  Other Topics Concern   Not on file  Social History Narrative   Married, 3 kids   Sign language interpreter   Former smoker, rare EtOH, no drugs   Social Determinants of Radio broadcast assistant Strain:    Difficulty of Paying Living Expenses: Not on file  Food Insecurity:    Worried About Charity fundraiser in  the Last Year: Not on file   [employer] of Food in the Last Year: Not on file  Transportation Needs:    Lack of Transportation (Medical): Not on file   Lack of Transportation (Non-Medical): Not on file  Physical Activity:    Days of Exercise per Week: Not on file   Minutes of Exercise per Session: Not on file  Stress:    Feeling of Stress : Not on file  Social Connections:    Frequency of Communication with Friends and Family: Not on file   Frequency of Social Gatherings with Friends and Family: Not on file   Attends Religious Services: Not on file   Active Member of Clubs or Organizations: Not on file   Attends Archivist Meetings: Not on file   Marital Status: Not on file  Intimate Partner Violence:    Fear of Current or Ex-Partner: Not on file   Emotionally Abused: Not on file   Physically Abused: Not on file   Sexually Abused: Not on file   Family History  Problem Relation Age of Onset   Heart attack Mother    Heart failure Mother    Diabetes Mother    Heart attack Father    Hypertension Father    Colon polyps Father    Heart disease Brother    Cancer Brother    Stroke Brother    Bladder Cancer Brother    Diabetes Brother    Clotting disorder Paternal Aunt        Blood clot after minor surgery   Breast cancer Paternal Aunt    Clotting disorder Paternal Grandmother        Blood clot after minor surgery   Clotting disorder Maternal Grandmother    Cancer Maternal Grandmother    Diabetes Maternal Grandfather    Alcohol abuse Paternal Grandfather    Colon cancer Neg Hx    Esophageal cancer Neg Hx    Rectal cancer Neg Hx    Past Surgical History:  Procedure Laterality Date   BLADDER SURGERY     at 54 years old, patient unaware of details   COLONOSCOPY  2000?   COLONOSCOPY WITH PROPOFOL N/A 04/22/2019   Procedure: COLONOSCOPY WITH PROPOFOL;  Surgeon: Gatha Mayer, MD;  Location: [facility];  Service: Endoscopy;   Laterality: N/A;   ESOPHAGOGASTRODUODENOSCOPY     POLYPECTOMY  04/22/2019   Procedure: POLYPECTOMY;  Surgeon: Gatha Mayer, MD;  Location: [facility];  Service: Endoscopy;;   [id number]  [id number]     Vanessa Kick, MD 09/29/19 0930

## 2019-10-13 ENCOUNTER — Other Ambulatory Visit: Payer: Self-pay | Admitting: Family Medicine

## 2019-10-13 ENCOUNTER — Encounter: Payer: Self-pay | Admitting: Family Medicine

## 2019-10-13 DIAGNOSIS — G8929 Other chronic pain: Secondary | ICD-10-CM

## 2019-10-14 MED ORDER — LISINOPRIL 2.5 MG PO TABS: 2.5000 mg | ORAL_TABLET | Freq: Every day | ORAL | 0 refills | Status: DC

## 2019-10-14 MED ORDER — MELOXICAM 15 MG PO TABS: 15.0000 mg | ORAL_TABLET | Freq: Every day | ORAL | 0 refills | Status: DC

## 2019-10-14 MED ORDER — ATORVASTATIN CALCIUM 10 MG PO TABS: 10.0000 mg | ORAL_TABLET | Freq: Every day | ORAL | 0 refills | Status: DC

## 2019-10-14 MED ORDER — HYDROCODONE-ACETAMINOPHEN 5-325 MG PO TABS: 1.0000 | ORAL_TABLET | Freq: Four times a day (QID) | ORAL | 0 refills | Status: DC | PRN

## 2019-10-14 MED ORDER — BUPROPION HCL ER (XL) 150 MG PO TB24: 150.0000 mg | ORAL_TABLET | Freq: Every day | ORAL | 0 refills | Status: DC

## 2019-10-14 MED ORDER — TESTOSTERONE CYPIONATE 100 MG/ML IM SOLN: 100.0000 mg | INTRAMUSCULAR | 0 refills | Status: DC

## 2019-10-14 NOTE — Telephone Encounter (Signed)
Last OV 01/14/19 Hydrocodone last filled 09/08/19 #120 with 0

## 2019-10-14 NOTE — Telephone Encounter (Signed)
Pt is diabetic and has not been seen since last year. Please advise?

## 2019-10-27 ENCOUNTER — Telehealth (INDEPENDENT_AMBULATORY_CARE_PROVIDER_SITE_OTHER): Payer: 59 | Admitting: Family Medicine

## 2019-10-27 ENCOUNTER — Other Ambulatory Visit: Payer: Self-pay

## 2019-10-27 ENCOUNTER — Encounter: Payer: Self-pay | Admitting: Family Medicine

## 2019-10-27 DIAGNOSIS — Z6841 Body Mass Index (BMI) 40.0 and over, adult: Secondary | ICD-10-CM

## 2019-10-27 DIAGNOSIS — E1169 Type 2 diabetes mellitus with other specified complication: Secondary | ICD-10-CM

## 2019-10-27 DIAGNOSIS — E785 Hyperlipidemia, unspecified: Secondary | ICD-10-CM

## 2019-10-27 DIAGNOSIS — Z111 Encounter for screening for respiratory tuberculosis: Secondary | ICD-10-CM

## 2019-10-27 DIAGNOSIS — I10 Essential (primary) hypertension: Secondary | ICD-10-CM | POA: Diagnosis not present

## 2019-10-27 DIAGNOSIS — E119 Type 2 diabetes mellitus without complications: Secondary | ICD-10-CM

## 2019-10-27 DIAGNOSIS — R7989 Other specified abnormal findings of blood chemistry: Secondary | ICD-10-CM

## 2019-10-27 NOTE — Progress Notes (Signed)
Virtual Visit via Video   I connected with patient on 10/27/19 at  2:00 PM EDT by a video enabled telemedicine application and verified that I am speaking with the correct person using two identifiers.  Location patient: Home Location provider: Acupuncturist, Office Persons participating in the virtual visit: Patient, Provider, [place] (Jess B)  I discussed the limitations of evaluation and management by telemedicine and the availability of in person appointments. The patient expressed understanding and agreed to proceed.  Subjective:   HPI:   HTN- chronic problem, on Lisinopril 2.5mg  daily.  Denies CP, SOB.  Occasional migraines.  No edema.   Hyperlipidemia- chronic problem, on Lipitor 10mg  daily, Lovaza 1gm and Fenofibrate 160mg  daily.  No abd pain, N/V.  DM- chronic problem, on Trulicity 0.75mg /week (not taking due to insurance- thinks this has been resolved) Wilder Glade 5mg  daily, Glipizide 10mg  BID.  On ACE for renal protection.  UTD on eye exam.  Due for foot exam and A1C.  Denies symptomatic lows.  No numbness/tingling of hands/feet.  Low Testosterone- 'i'm pretty much a woman with a penis'.  Pt is interested in restarting medication.  Has had hx of this and has been on meds previously.  Obesity- insurance will not cover weight loss surgery.  ROS:   See pertinent positives and negatives per HPI.  Patient Active Problem List   Diagnosis Date Noted   Benign neoplasm of sigmoid colon    Moderate episode of recurrent major depressive disorder ([place]) 05/13/2018   Insomnia 03/07/2017   Hyperlipidemia associated with type 2 diabetes mellitus ([place]) 12/13/2016   HTN (hypertension) 12/13/2016   Colon cancer screening 06/01/2016   BPH (benign prostatic hyperplasia) 09/24/2015   Low testosterone 09/24/2015   Chronic joint pain 09/24/2015   Diabetes mellitus, type 2 ([place]) 11/22/2012   Morbid obesity with BMI of 50.0-59.9, adult ([place]) 11/22/2012   Sleep apnea 11/22/2012     Social History   Tobacco Use   Smoking status: Former Smoker    Packs/day: 1.00    Years: 7.00    Pack years: 7.00    Types: Cigarettes    Quit date: 09/22/2012    Years since quitting: 7.0   Smokeless tobacco: Never Used  Substance Use Topics   Alcohol use: Yes    Comment: Rarely    Current Outpatient Medications:    albuterol (VENTOLIN HFA) 108 (90 Base) MCG/ACT inhaler, Inhale 1-2 puffs into the lungs every 6 (six) hours as needed for wheezing or shortness of breath., Disp: 6.7 g, Rfl: 0   ALPRAZolam (XANAX) 0.5 MG tablet, TAKE 1 TABLET BY MOUTH TWICE A DAY AS NEEDED FOR ANXIETY, Disp: 60 tablet, Rfl: 1   atorvastatin (LIPITOR) 10 MG tablet, Take 1 tablet (10 mg total) by mouth daily., Disp: 90 tablet, Rfl: 0   buPROPion (WELLBUTRIN XL) 150 MG 24 hr tablet, Take 1 tablet (150 mg total) by mouth daily., Disp: 90 tablet, Rfl: 0   FARXIGA 5 MG TABS tablet, TAKE 1 TABLET BY MOUTH EVERY DAY, Disp: 90 tablet, Rfl: 1   fenofibrate 160 MG tablet, TAKE 1 TABLET BY MOUTH EVERY DAY, Disp: 90 tablet, Rfl: 1   Flaxseed, Linseed, (FLAX SEED OIL) 1000 MG CAPS, Take 1,000 mg by mouth 3 (three) times a week., Disp: , Rfl:    FLUoxetine (PROZAC) 20 MG tablet, TAKE 1 TABLET BY MOUTH EVERY DAY (Patient taking differently: Take 20 mg by mouth daily. ), Disp: 90 tablet, Rfl: 1   fluticasone (FLONASE) 50 MCG/ACT nasal  spray, Place 1 spray into both nostrils daily as needed for allergies or rhinitis., Disp: 16 g, Rfl: 0   glipiZIDE (GLUCOTROL) 10 MG tablet, TAKE 1 TABLET BY MOUTH EVERY 12 HOURS **NEED OFFICE VISIT** (Patient taking differently: Take 10 mg by mouth in the morning and at bedtime. ), Disp: 180 tablet, Rfl: 0   HYDROcodone-acetaminophen (NORCO/VICODIN) 5-325 MG tablet, Take 1 tablet by mouth every 6 (six) hours as needed for moderate pain., Disp: 120 tablet, Rfl: 0   lisinopril (ZESTRIL) 2.5 MG tablet, Take 1 tablet (2.5 mg total) by mouth daily., Disp: 90 tablet, Rfl: 0   loratadine (CLARITIN)  10 MG tablet, Take 10 mg by mouth daily. , Disp: , Rfl:    meloxicam (MOBIC) 15 MG tablet, Take 1 tablet (15 mg total) by mouth daily., Disp: 90 tablet, Rfl: 0   nitroGLYCERIN (NITROSTAT) 0.4 MG SL tablet, PLACE 1 TABLET (0.4 MG TOTAL) UNDER THE TONGUE EVERY 5 (FIVE) MINUTES AS NEEDED FOR CHEST PAIN., Disp: 75 tablet, Rfl: 1   omega-3 acid ethyl esters (LOVAZA) 1 g capsule, Take 1 g by mouth 3 (three) times a week., Disp: , Rfl:    ondansetron (ZOFRAN ODT) 4 MG disintegrating tablet, Take 1 tablet (4 mg total) by mouth every 8 (eight) hours as needed for nausea or vomiting., Disp: 5 tablet, Rfl: 0   tamsulosin (FLOMAX) 0.4 MG CAPS capsule, TAKE 1 CAPSULE BY MOUTH EVERY DAY, Disp: 90 capsule, Rfl: 1   testosterone cypionate (DEPOTESTOTERONE CYPIONATE) 100 MG/ML injection, Inject 1 mL (100 mg total) into the muscle every 14 (fourteen) days. For IM use only, Disp: 10 mL, Rfl: 0   tiZANidine (ZANAFLEX) 4 MG tablet, TAKE 1 TABLET EVERY 6 HOURS AS NEEDED FOR MUSCLE SPASMS, Disp: 45 tablet, Rfl: 3   traZODone (DESYREL) 100 MG tablet, TAKE 1 TABLET BY MOUTH EVERYDAY AT BEDTIME (Patient taking differently: Take 100 mg by mouth at bedtime. ), Disp: 90 tablet, Rfl: 2   traZODone (DESYREL) 50 MG tablet, TAKE 1/2 TO 1 TABLET BY MOUTH AT BEDTIME AS NEEDED FOR SLEEP, Disp: 90 tablet, Rfl: 1   TRULICITY 4.85 IO/2.7OJ SOPN, INJECT 0.75 MG INTO THE SKIN ONCE A WEEK., Disp: 4 pen, Rfl: 12  Allergies  Allergen Reactions   Penicillins Anaphylaxis    Did it involve swelling of the face/tongue/throat, SOB, or low BP? No Did it involve sudden or severe rash/hives, skin peeling, or any reaction on the inside of your mouth or nose? no Did you need to seek medical attention at a hospital or doctor's office? No When did it last happen?      Middle school If all above answers are "NO", may proceed with cephalosporin use.   Ambien [Zolpidem Tartrate] Other (See Comments)    nightmares   Cetirizine Other (See Comments)     Makes feel "drunk"   Gluten Meal Other (See Comments)    Diarrhea and headache   Lidocaine Swelling and Other (See Comments)    Redness that spreads.  PATIENT CAN USE PRESERVATIVE FREE LIDOCAINE Non preservative there is no problem   Pseudoephedrine Hcl Other (See Comments)    Makes feel "drunk"   Zolpidem Other (See Comments)    nightmares   Latex Itching, Swelling and Rash    Objective:   There were no vitals taken for this visit. AAOx3, NAD Obese NCAT, EOMI No obvious CN deficits Coloring WNL Pt is able to speak clearly, coherently without shortness of breath or increased work of breathing.  Thought process is linear.  Mood is appropriate.   Assessment and Plan:   DM- chronic problem for pt.  Has not been compliant w/ f/u appts.  States he is UTD on eye exam.  On ACE for renal protection.  Currently on Farxiga and Glipizide while he waits for insurance to resolve the issue w/ his Trulicity.  Unable to do foot exam virtually.  Stressed need for low carb diet, regular physical activity, and routine f/u.  Check labs.  Adjust meds prn   HTN- chronic problem.  Hx of good control.  Currently on low dose Lisinopril.  Check labs due to ACE.  No anticipated med changes.  Hyperlipidemia- chronic problem.  Overdue for lipid panel.  Currently on Lipitor, Lovaza, and Fenofibrate w/o difficulty.  Check labs.  Adjust meds prn   Low T- ongoing issue for pt.  Would like to restart medication.  Will get new baseline testosterone level and start injections as needed.  Obesity- ongoing issue for pt.  He states he would possibly be interested in bariatric surgery but insurance doesn't cover at this time.  Stressed need for healthy diet and regular exercise.  Will follow.  TB screen- quantiferon ordered.   Annye Asa, MD 10/27/2019

## 2019-10-27 NOTE — Progress Notes (Signed)
I have discussed the procedure for the virtual visit with the patient who has given consent to proceed with assessment and treatment.   Morrissa Shein L Mackenzy Eisenberg, CMA     

## 2019-10-28 ENCOUNTER — Encounter: Payer: Self-pay | Admitting: Family Medicine

## 2019-11-04 ENCOUNTER — Ambulatory Visit (INDEPENDENT_AMBULATORY_CARE_PROVIDER_SITE_OTHER): Payer: 59

## 2019-11-04 ENCOUNTER — Other Ambulatory Visit: Payer: Self-pay

## 2019-11-04 DIAGNOSIS — R7989 Other specified abnormal findings of blood chemistry: Secondary | ICD-10-CM

## 2019-11-04 DIAGNOSIS — Z6841 Body Mass Index (BMI) 40.0 and over, adult: Secondary | ICD-10-CM

## 2019-11-04 DIAGNOSIS — E1169 Type 2 diabetes mellitus with other specified complication: Secondary | ICD-10-CM | POA: Diagnosis not present

## 2019-11-04 DIAGNOSIS — Z23 Encounter for immunization: Secondary | ICD-10-CM | POA: Diagnosis not present

## 2019-11-04 DIAGNOSIS — E785 Hyperlipidemia, unspecified: Secondary | ICD-10-CM

## 2019-11-04 DIAGNOSIS — Z111 Encounter for screening for respiratory tuberculosis: Secondary | ICD-10-CM | POA: Diagnosis not present

## 2019-11-04 DIAGNOSIS — E119 Type 2 diabetes mellitus without complications: Secondary | ICD-10-CM

## 2019-11-05 LAB — LIPID PANEL
Cholesterol: 151 mg/dL (ref 0–200)
HDL: 40.8 mg/dL (ref >39.00)
LDL Cholesterol: 77 mg/dL (ref 0–99)
NonHDL: 109.7
Total CHOL/HDL Ratio: 4
Triglycerides: 163 mg/dL — ABNORMAL HIGH (ref 0.0–149.0)
VLDL: 32.6 mg/dL (ref 0.0–40.0)

## 2019-11-05 LAB — CBC WITH DIFFERENTIAL/PLATELET
Basophils Absolute: 0.1 10*3/uL (ref 0.0–0.1)
Basophils Relative: 0.9 % (ref 0.0–3.0)
Eosinophils Absolute: 0.1 10*3/uL (ref 0.0–0.7)
Eosinophils Relative: 1.8 % (ref 0.0–5.0)
HCT: 42.7 % (ref 39.0–52.0)
Hemoglobin: 15 g/dL (ref 13.0–17.0)
Lymphocytes Relative: 34.6 % (ref 12.0–46.0)
Lymphs Abs: 2.1 10*3/uL (ref 0.7–4.0)
MCHC: 35.1 g/dL (ref 30.0–36.0)
MCV: 90.3 fl (ref 78.0–100.0)
Monocytes Absolute: 0.5 10*3/uL (ref 0.1–1.0)
Monocytes Relative: 8.7 % (ref 3.0–12.0)
Neutro Abs: 3.2 10*3/uL (ref 1.4–7.7)
Neutrophils Relative %: 54 % (ref 43.0–77.0)
Platelets: 230 10*3/uL (ref 150.0–400.0)
RBC: 4.72 Mil/uL (ref 4.22–5.81)
RDW: 12.6 % (ref 11.5–15.5)
WBC: 6 10*3/uL (ref 4.0–10.5)

## 2019-11-05 LAB — BASIC METABOLIC PANEL
BUN: 15 mg/dL (ref 6–23)
CO2: 24 mEq/L (ref 19–32)
Calcium: 8.8 mg/dL (ref 8.4–10.5)
Chloride: 104 mEq/L (ref 96–112)
Creatinine, Ser: 0.8 mg/dL (ref 0.40–1.50)
GFR: 102.27 mL/min (ref >60.00)
Glucose, Bld: 247 mg/dL — ABNORMAL HIGH (ref 70–99)
Potassium: 4 mEq/L (ref 3.5–5.1)
Sodium: 137 mEq/L (ref 135–145)

## 2019-11-05 LAB — HEPATIC FUNCTION PANEL
ALT: 31 U/L (ref 0–53)
AST: 24 U/L (ref 0–37)
Albumin: 4 g/dL (ref 3.5–5.2)
Alkaline Phosphatase: 39 U/L (ref 39–117)
Bilirubin, Direct: 0.1 mg/dL (ref 0.0–0.3)
Total Bilirubin: 0.4 mg/dL (ref 0.2–1.2)
Total Protein: 6.1 g/dL (ref 6.0–8.3)

## 2019-11-05 LAB — TESTOSTERONE: Testosterone: 135.01 ng/dL — ABNORMAL LOW (ref 300.00–890.00)

## 2019-11-05 LAB — TSH: TSH: 1.93 u[IU]/mL (ref 0.35–4.50)

## 2019-11-05 LAB — HEMOGLOBIN A1C: Hgb A1c MFr Bld: 9.3 % — ABNORMAL HIGH (ref 4.6–6.5)

## 2019-11-06 LAB — QUANTIFERON-TB GOLD PLUS
Mitogen-NIL: >10 IU/mL
NIL: 0.04 IU/mL
QuantiFERON-TB Gold Plus: NEGATIVE
TB1-NIL: 0 IU/mL
TB2-NIL: 0 IU/mL

## 2019-11-07 ENCOUNTER — Other Ambulatory Visit: Payer: Self-pay

## 2019-11-07 DIAGNOSIS — E119 Type 2 diabetes mellitus without complications: Secondary | ICD-10-CM

## 2019-11-07 DIAGNOSIS — R7989 Other specified abnormal findings of blood chemistry: Secondary | ICD-10-CM

## 2019-11-13 ENCOUNTER — Encounter: Payer: Self-pay | Admitting: Family Medicine

## 2019-11-13 DIAGNOSIS — G8929 Other chronic pain: Secondary | ICD-10-CM

## 2019-11-13 NOTE — Telephone Encounter (Signed)
Last OV 10/27/19 Hydrocodone last filled 10/14/19 #120 with 0

## 2019-11-14 MED ORDER — HYDROCODONE-ACETAMINOPHEN 5-325 MG PO TABS: 1.0000 | ORAL_TABLET | Freq: Four times a day (QID) | ORAL | 0 refills | Status: DC | PRN

## 2019-11-26 ENCOUNTER — Encounter: Payer: Self-pay | Admitting: Family Medicine

## 2019-11-26 ENCOUNTER — Telehealth: Payer: Self-pay | Admitting: Family Medicine

## 2019-11-26 NOTE — Telephone Encounter (Signed)
Encounter started in error.

## 2019-11-30 ENCOUNTER — Other Ambulatory Visit: Payer: Self-pay | Admitting: Family Medicine

## 2019-12-01 NOTE — Telephone Encounter (Signed)
FARXIGA 5 MG TABLET Last OV- 10/27/19 Last refill 05/05/19--75 tabs / 1 refill  NITROGLYCERIN 0.4 MG TABLET SL Last OV 10/27/19 Last refill 05/26/19--90 tabs / 1 refills.

## 2019-12-05 ENCOUNTER — Encounter: Payer: Self-pay | Admitting: Family Medicine

## 2019-12-18 ENCOUNTER — Encounter: Payer: Self-pay | Admitting: Family Medicine

## 2019-12-18 DIAGNOSIS — G8929 Other chronic pain: Secondary | ICD-10-CM

## 2019-12-18 NOTE — Telephone Encounter (Signed)
Hydrocodone  LR: 11-14-2019 Qty: 120 with 0 refills  Last office visit: 10-27-2019 (video visit) Upcoming appointment: No pending appt.

## 2019-12-19 MED ORDER — HYDROCODONE-ACETAMINOPHEN 5-325 MG PO TABS: 1.0000 | ORAL_TABLET | Freq: Four times a day (QID) | ORAL | 0 refills | Status: DC | PRN

## 2019-12-23 ENCOUNTER — Telehealth: Payer: Self-pay

## 2019-12-23 NOTE — Telephone Encounter (Signed)
A prior authorization has been approved for Hydrocodone 5-325 mg

## 2020-01-01 ENCOUNTER — Other Ambulatory Visit: Payer: Self-pay | Admitting: Family Medicine

## 2020-01-07 ENCOUNTER — Encounter: Payer: Self-pay | Admitting: Family Medicine

## 2020-01-13 ENCOUNTER — Other Ambulatory Visit: Payer: Self-pay | Admitting: Family Medicine

## 2020-02-07 ENCOUNTER — Other Ambulatory Visit: Payer: Self-pay | Admitting: Family Medicine

## 2020-03-25 ENCOUNTER — Other Ambulatory Visit: Payer: Self-pay | Admitting: Family Medicine

## 2020-03-29 ENCOUNTER — Encounter: Payer: Self-pay | Admitting: Family Medicine

## 2020-03-31 ENCOUNTER — Telehealth: Payer: Self-pay

## 2020-03-31 DIAGNOSIS — G8929 Other chronic pain: Secondary | ICD-10-CM

## 2020-03-31 MED ORDER — HYDROCODONE-ACETAMINOPHEN 5-325 MG PO TABS: 1.0000 | ORAL_TABLET | Freq: Four times a day (QID) | ORAL | 0 refills | Status: DC | PRN

## 2020-03-31 NOTE — Telephone Encounter (Signed)
Prescription was sent to pharmacy as pt requested but this will be last refill unless he follows through with the Endocrinology referral I placed in October.  They called him and left multiple messages and mailed a letter.  He must call and schedule an appt.

## 2020-03-31 NOTE — Telephone Encounter (Signed)
Called and left a message on patient vm. Per your order Hydrocodone 5-325 mg was sent to patient pharmacy.However he cannot receive anymore refills until he follows through with Endocrinology referral that was placed back in October.

## 2020-03-31 NOTE — Telephone Encounter (Signed)
Requesting:Hydrocodone 5-325mg   Contract: UDS: Last Visit:10/27/19 Next Visit:05/05/20 Last Refill:12/19/19 120 tabs 0 refills  Please Advise

## 2020-03-31 NOTE — Telephone Encounter (Signed)
Pt has an appt with Dr. Birdie Riddle on 05/05/20 wanted to know if a refill on the hydrocodone can be sent to the [facility] [place]. He is almost out of this medication

## 2020-04-05 MED ORDER — HYDROCODONE-ACETAMINOPHEN 7.5-325 MG PO TABS
1.0000 | ORAL_TABLET | Freq: Four times a day (QID) | ORAL | 0 refills | Status: DC | PRN
Start: 2020-04-05 — End: 2020-05-14

## 2020-04-05 NOTE — Telephone Encounter (Signed)
Please give the original 5/325mg  dose

## 2020-04-05 NOTE — Telephone Encounter (Signed)
[facility]-325 MG but now has them in stock. Wondering if Dr.Tabori wants to give original prescription or the 5-325.

## 2020-04-05 NOTE — Telephone Encounter (Signed)
Pharmacy now has original Rx. Going back to this one or keep the current?

## 2020-04-06 NOTE — Telephone Encounter (Signed)
FYI: Patient has picked up original Rx

## 2020-04-24 ENCOUNTER — Other Ambulatory Visit: Payer: Self-pay | Admitting: Family Medicine

## 2020-04-25 ENCOUNTER — Other Ambulatory Visit: Payer: Self-pay | Admitting: Family Medicine

## 2020-05-03 ENCOUNTER — Ambulatory Visit: Payer: Self-pay | Admitting: Family Medicine

## 2020-05-05 ENCOUNTER — Other Ambulatory Visit: Payer: Self-pay

## 2020-05-05 ENCOUNTER — Ambulatory Visit: Payer: BC Managed Care – PPO | Admitting: Family Medicine

## 2020-05-05 ENCOUNTER — Encounter: Payer: Self-pay | Admitting: Family Medicine

## 2020-05-05 VITALS — BP 122/75 | HR 58 | Temp 97.9°F | Resp 20 | Ht 66.0 in | Wt 346.2 lb

## 2020-05-05 DIAGNOSIS — E1169 Type 2 diabetes mellitus with other specified complication: Secondary | ICD-10-CM

## 2020-05-05 DIAGNOSIS — G8929 Other chronic pain: Secondary | ICD-10-CM

## 2020-05-05 DIAGNOSIS — E119 Type 2 diabetes mellitus without complications: Secondary | ICD-10-CM

## 2020-05-05 DIAGNOSIS — M2550 Pain in unspecified joint: Secondary | ICD-10-CM | POA: Diagnosis not present

## 2020-05-05 DIAGNOSIS — F331 Major depressive disorder, recurrent, moderate: Secondary | ICD-10-CM

## 2020-05-05 DIAGNOSIS — E785 Hyperlipidemia, unspecified: Secondary | ICD-10-CM | POA: Diagnosis not present

## 2020-05-05 DIAGNOSIS — Z6841 Body Mass Index (BMI) 40.0 and over, adult: Secondary | ICD-10-CM

## 2020-05-05 LAB — CBC WITH DIFFERENTIAL/PLATELET
Basophils Absolute: 0 10*3/uL (ref 0.0–0.1)
Basophils Relative: 0.4 % (ref 0.0–3.0)
Eosinophils Absolute: 0.1 10*3/uL (ref 0.0–0.7)
Eosinophils Relative: 1.3 % (ref 0.0–5.0)
HCT: 45.8 % (ref 39.0–52.0)
Hemoglobin: 16.1 g/dL (ref 13.0–17.0)
Lymphocytes Relative: 27.9 % (ref 12.0–46.0)
Lymphs Abs: 1.6 10*3/uL (ref 0.7–4.0)
MCHC: 35.2 g/dL (ref 30.0–36.0)
MCV: 89.7 fl (ref 78.0–100.0)
Monocytes Absolute: 0.4 10*3/uL (ref 0.1–1.0)
Monocytes Relative: 6.8 % (ref 3.0–12.0)
Neutro Abs: 3.7 10*3/uL (ref 1.4–7.7)
Neutrophils Relative %: 63.6 % (ref 43.0–77.0)
Platelets: 217 10*3/uL (ref 150.0–400.0)
RBC: 5.11 Mil/uL (ref 4.22–5.81)
RDW: 12.9 % (ref 11.5–15.5)
WBC: 5.9 10*3/uL (ref 4.0–10.5)

## 2020-05-05 LAB — BASIC METABOLIC PANEL
BUN: 18 mg/dL (ref 6–23)
CO2: 25 mEq/L (ref 19–32)
Calcium: 9.3 mg/dL (ref 8.4–10.5)
Chloride: 100 mEq/L (ref 96–112)
Creatinine, Ser: 0.8 mg/dL (ref 0.40–1.50)
GFR: 101.33 mL/min (ref >60.00)
Glucose, Bld: 241 mg/dL — ABNORMAL HIGH (ref 70–99)
Potassium: 3.9 mEq/L (ref 3.5–5.1)
Sodium: 134 mEq/L — ABNORMAL LOW (ref 135–145)

## 2020-05-05 LAB — HEPATIC FUNCTION PANEL
ALT: 29 U/L (ref 0–53)
AST: 23 U/L (ref 0–37)
Albumin: 4 g/dL (ref 3.5–5.2)
Alkaline Phosphatase: 48 U/L (ref 39–117)
Bilirubin, Direct: 0.1 mg/dL (ref 0.0–0.3)
Total Bilirubin: 0.5 mg/dL (ref 0.2–1.2)
Total Protein: 6.9 g/dL (ref 6.0–8.3)

## 2020-05-05 LAB — HEMOGLOBIN A1C: Hgb A1c MFr Bld: 9.9 % — ABNORMAL HIGH (ref 4.6–6.5)

## 2020-05-05 LAB — LIPID PANEL
Cholesterol: 118 mg/dL (ref 0–200)
HDL: 47.2 mg/dL (ref >39.00)
LDL Cholesterol: 42 mg/dL (ref 0–99)
NonHDL: 71.17
Total CHOL/HDL Ratio: 3
Triglycerides: 148 mg/dL (ref 0.0–149.0)
VLDL: 29.6 mg/dL (ref 0.0–40.0)

## 2020-05-05 LAB — TSH: TSH: 2.02 u[IU]/mL (ref 0.35–4.50)

## 2020-05-05 MED ORDER — FLUOXETINE HCL 40 MG PO CAPS: 40.0000 mg | ORAL_CAPSULE | Freq: Every day | ORAL | 1 refills | Status: DC

## 2020-05-05 NOTE — Patient Instructions (Signed)
Follow up in 4-6 weeks to recheck mood We'll notify you of your lab results and make any changes if needed Call and schedule your eye exam at your convenience Call and schedule your podiatry appt INCREASE the Prozac to 40mg  daily- 2 of what you have at home and 1 of the new prescription Continue to work on low carb diet and regular exercise- you can do it! Call with any questions or concerns Hang in there!!!

## 2020-05-05 NOTE — Assessment & Plan Note (Signed)
BMI is 55.88  No regular exercise, not following a low carb diet.  Stressed need for both.  Suspect until his depression is under better control, we won't be able to tackle weight management.  Will follow.

## 2020-05-05 NOTE — Assessment & Plan Note (Signed)
Chronic problem.  He was referred to Endo in October and has his first appt next week.  He has not been able to afford Trulicity but states he is taking his Glipizide and [place].  He is aware that he is not eating well or exercising or taking his medications as he is supposed to, but his depression currently has him in a place where it is hard for him to care.  Hopefully seeing Endo next week will jumpstart things.  Pt to schedule eye exam at his convenience.  On ACE for renal protection.  Check labs.  Adjust meds prn

## 2020-05-05 NOTE — Assessment & Plan Note (Signed)
Deteriorated.  Pt is admittedly down and not himself.  Given all of the current stress, will increase Prozac to 40mg  daily and hopefully improve his mood.  Pt expressed understanding and is in agreement w/ plan.

## 2020-05-05 NOTE — Assessment & Plan Note (Signed)
Ongoing issue for pt.  He has known arthritis of knees, spinal stenosis, neck and shoulder pain and he is maintained on Hydrocodone per our agreement.
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
## 2020-05-05 NOTE — Assessment & Plan Note (Signed)
Chronic problem.  Currently on Lipitor 10mg  daily and Fenofibrate 160mg  daily.  Check labs.  Adjust meds prn

## 2020-05-07 ENCOUNTER — Other Ambulatory Visit: Payer: Self-pay

## 2020-05-07 ENCOUNTER — Encounter: Payer: Self-pay | Admitting: Family Medicine

## 2020-05-07 NOTE — Telephone Encounter (Signed)
Patient is requesting a refill of the following medications: Requested Prescriptions   Pending Prescriptions Disp Refills  . HYDROcodone-acetaminophen (NORCO) 7.5-325 MG tablet 120 tablet 0    Sig: Take 1 tablet by mouth every 6 (six) hours as needed for moderate pain.    Date of patient request: 05/07/2020 Last office visit: 05/05/2020 Date of last refill: 04/05/2020 Last refill amount: 120 tablets Follow up time period per chart: none

## 2020-05-07 NOTE — Telephone Encounter (Signed)
Medication request has been sent to the pharmacy

## 2020-05-14 ENCOUNTER — Other Ambulatory Visit: Payer: Self-pay

## 2020-05-14 ENCOUNTER — Ambulatory Visit: Payer: BC Managed Care – PPO | Admitting: Internal Medicine

## 2020-05-14 ENCOUNTER — Encounter: Payer: Self-pay | Admitting: Internal Medicine

## 2020-05-14 ENCOUNTER — Encounter: Payer: Self-pay | Admitting: Family Medicine

## 2020-05-14 VITALS — BP 126/78 | HR 68 | Ht 66.0 in | Wt 346.5 lb

## 2020-05-14 DIAGNOSIS — E785 Hyperlipidemia, unspecified: Secondary | ICD-10-CM | POA: Diagnosis not present

## 2020-05-14 DIAGNOSIS — E291 Testicular hypofunction: Secondary | ICD-10-CM

## 2020-05-14 DIAGNOSIS — E119 Type 2 diabetes mellitus without complications: Secondary | ICD-10-CM

## 2020-05-14 DIAGNOSIS — E1165 Type 2 diabetes mellitus with hyperglycemia: Secondary | ICD-10-CM | POA: Insufficient documentation

## 2020-05-14 LAB — POCT GLUCOSE (DEVICE FOR HOME USE): POC Glucose: 308 mg/dl — AB (ref 70–99)

## 2020-05-14 MED ORDER — DEXCOM G6 SENSOR MISC: 1.0000 | 3 refills | Status: DC

## 2020-05-14 MED ORDER — DEXCOM G6 TRANSMITTER MISC: 1.0000 | 3 refills | Status: DC

## 2020-05-14 MED ORDER — GLIPIZIDE 10 MG PO TABS: 10.0000 mg | ORAL_TABLET | Freq: Two times a day (BID) | ORAL | 1 refills | Status: DC

## 2020-05-14 MED ORDER — RYBELSUS 3 MG PO TABS: 3.0000 mg | ORAL_TABLET | Freq: Every day | ORAL | 4 refills | Status: DC

## 2020-05-14 MED ORDER — DAPAGLIFLOZIN PROPANEDIOL 10 MG PO TABS: 10.0000 mg | ORAL_TABLET | Freq: Every day | ORAL | 1 refills | Status: DC

## 2020-05-14 NOTE — Telephone Encounter (Signed)
LFD 04/05/20 #120 with no refills LOV 05/05/20 NOV none
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
## 2020-05-14 NOTE — Patient Instructions (Addendum)
-   Glipizide 10 mg, 1 tablet Before Breakfast and Supper ( preferably 20-30 minutes before the meal) - Start Rybelsus 3 mg, 1 tablet before breakfast ( preferably 30 minutes before a meal) - Increase Farxiga to 10 mg, 1 tablet before breakfast      Choose healthy, lower carb lower calorie snacks: toss salad, vegetables, cottage cheese, peanut butter, low fat cheese / string cheese, lower sodium deli meat, tuna salad or chicken salad, boiled eggs .      HOW TO TREAT LOW BLOOD SUGARS (Blood sugar LESS THAN 70 MG/DL)  Please follow the RULE OF 15 for the treatment of hypoglycemia treatment (when your (blood sugars are less than 70 mg/dL)    STEP 1: Take 15 grams of carbohydrates when your blood sugar is low, which includes:   3-4 GLUCOSE TABS  OR  3-4 OZ OF JUICE OR REGULAR SODA OR  ONE TUBE OF GLUCOSE GEL     STEP 2: RECHECK blood sugar in 15 MINUTES STEP 3: If your blood sugar is still low at the 15 minute recheck --> then, go back to STEP 1 and treat AGAIN with another 15 grams of carbohydrates.

## 2020-05-17 MED ORDER — HYDROCODONE-ACETAMINOPHEN 7.5-325 MG PO TABS: 1.0000 | ORAL_TABLET | Freq: Four times a day (QID) | ORAL | 0 refills | Status: DC | PRN

## 2020-05-23 ENCOUNTER — Other Ambulatory Visit: Payer: Self-pay | Admitting: Family Medicine

## 2020-05-24 NOTE — Telephone Encounter (Signed)
LOV 04/17/20 Testosterone is not listed on patient's current medication list.

## 2020-05-27 ENCOUNTER — Other Ambulatory Visit: Payer: Self-pay | Admitting: Family Medicine

## 2020-06-09 ENCOUNTER — Other Ambulatory Visit: Payer: Self-pay | Admitting: Family Medicine

## 2020-06-10 NOTE — Telephone Encounter (Signed)
LFD 04/26/20 #30 with no refills LOV 05/05/20 NOV none

## 2020-06-16 ENCOUNTER — Other Ambulatory Visit: Payer: Self-pay

## 2020-06-16 ENCOUNTER — Encounter: Payer: Self-pay | Admitting: Family Medicine

## 2020-06-16 MED ORDER — HYDROCODONE-ACETAMINOPHEN 7.5-325 MG PO TABS: 1.0000 | ORAL_TABLET | Freq: Four times a day (QID) | ORAL | 0 refills | Status: DC | PRN

## 2020-06-16 NOTE — Telephone Encounter (Signed)
Hydrocodone-Requesting a refill LFD 05/17/20 #120 with no refills LOV 05/05/20 NOV none

## 2020-06-23 ENCOUNTER — Other Ambulatory Visit: Payer: Self-pay | Admitting: Family Medicine

## 2020-06-23 ENCOUNTER — Telehealth: Payer: Self-pay | Admitting: Pharmacy Technician

## 2020-06-23 NOTE — Telephone Encounter (Addendum)
Patient Advocate Encounter   Received notification from [facility] that prior authorization for [employer]  are required.   PA's submitted on 06/23/2020 Key [id number] transmitter Key [id number] sensor Status is DENIED due to MUST FIRST BE ON INSULIN.  INSURANCE PHONE NUMBER IS [phone]    [facility] will continue to follow.   Venida Jarvis. Nadara Mustard, CPhT Patient Advocate [facility] Phone: [phone] Fax:  [phone]

## 2020-06-23 NOTE — Telephone Encounter (Signed)
Requesting: Testosterone 1000mg   Contract: UDS: Last Visit:05/05/20 Next Visit:n/a Last Refill:05/24/20 30ml 0 refills  Please Advise

## 2020-06-30 ENCOUNTER — Other Ambulatory Visit: Payer: Self-pay | Admitting: Family Medicine

## 2020-07-01 ENCOUNTER — Other Ambulatory Visit: Payer: Self-pay | Admitting: Family Medicine

## 2020-07-02 ENCOUNTER — Other Ambulatory Visit: Payer: Self-pay | Admitting: Internal Medicine

## 2020-07-08 DIAGNOSIS — G4700 Insomnia, unspecified: Secondary | ICD-10-CM | POA: Diagnosis not present

## 2020-07-08 DIAGNOSIS — Z9989 Dependence on other enabling machines and devices: Secondary | ICD-10-CM | POA: Diagnosis not present

## 2020-07-08 DIAGNOSIS — G4733 Obstructive sleep apnea (adult) (pediatric): Secondary | ICD-10-CM | POA: Diagnosis not present

## 2020-07-08 DIAGNOSIS — F419 Anxiety disorder, unspecified: Secondary | ICD-10-CM | POA: Diagnosis not present

## 2020-07-13 ENCOUNTER — Other Ambulatory Visit: Payer: Self-pay | Admitting: Family Medicine

## 2020-07-14 ENCOUNTER — Encounter: Payer: Self-pay | Admitting: *Deleted

## 2020-07-19 ENCOUNTER — Encounter: Payer: Self-pay | Admitting: Family Medicine

## 2020-07-20 ENCOUNTER — Other Ambulatory Visit: Payer: Self-pay

## 2020-07-20 MED ORDER — HYDROCODONE-ACETAMINOPHEN 7.5-325 MG PO TABS: 1.0000 | ORAL_TABLET | Freq: Four times a day (QID) | ORAL | 0 refills | Status: DC | PRN

## 2020-07-20 NOTE — Telephone Encounter (Signed)
LFD 06/16/20 #120 with no refills LOV 05/05/20 NOV none

## 2020-07-27 ENCOUNTER — Other Ambulatory Visit: Payer: Self-pay

## 2020-07-27 ENCOUNTER — Ambulatory Visit (HOSPITAL_COMMUNITY)
Admission: EM | Admit: 2020-07-27 | Discharge: 2020-07-27 | Disposition: A | Discharge status: Home / Self Care | Payer: BC Managed Care – PPO | Attending: Internal Medicine | Admitting: Internal Medicine

## 2020-07-27 ENCOUNTER — Encounter (HOSPITAL_COMMUNITY): Payer: Self-pay

## 2020-07-27 DIAGNOSIS — J020 Streptococcal pharyngitis: Secondary | ICD-10-CM | POA: Diagnosis not present

## 2020-07-27 DIAGNOSIS — Z880 Allergy status to penicillin: Secondary | ICD-10-CM | POA: Insufficient documentation

## 2020-07-27 DIAGNOSIS — Z20822 Contact with and (suspected) exposure to covid-19: Secondary | ICD-10-CM | POA: Diagnosis not present

## 2020-07-27 DIAGNOSIS — Z7984 Long term (current) use of oral hypoglycemic drugs: Secondary | ICD-10-CM | POA: Diagnosis not present

## 2020-07-27 DIAGNOSIS — R0981 Nasal congestion: Secondary | ICD-10-CM | POA: Insufficient documentation

## 2020-07-27 DIAGNOSIS — J029 Acute pharyngitis, unspecified: Secondary | ICD-10-CM | POA: Diagnosis not present

## 2020-07-27 DIAGNOSIS — Z87891 Personal history of nicotine dependence: Secondary | ICD-10-CM | POA: Diagnosis not present

## 2020-07-27 DIAGNOSIS — Z888 Allergy status to other drugs, medicaments and biological substances status: Secondary | ICD-10-CM | POA: Diagnosis not present

## 2020-07-27 DIAGNOSIS — Z79899 Other long term (current) drug therapy: Secondary | ICD-10-CM | POA: Diagnosis not present

## 2020-07-27 LAB — POCT RAPID STREP A, ED / UC: Streptococcus, Group A Screen (Direct): POSITIVE — AB

## 2020-07-27 LAB — SARS CORONAVIRUS 2 (TAT 6-24 HRS): SARS Coronavirus 2: NEGATIVE

## 2020-07-27 MED ORDER — AZITHROMYCIN 500 MG PO TABS: 500.0000 mg | ORAL_TABLET | Freq: Every day | ORAL | 0 refills | Status: AC

## 2020-07-27 NOTE — ED Provider Notes (Signed)
[facility]    CSN: [id number] Arrival date & time: 07/27/20  1136      History   Chief Complaint Chief Complaint  Patient presents with   Sore Throat   Nasal Congestion   Fatigue    HPI Caleb Caylor. is a 55 y.o. male.   Patient presents to the urgent care with 5-day history of sore throat, nasal congestion, and fatigue.  States that his wife had strep approximately 1 week ago.  Denies any fevers at home.  Patient states that he has not taken any over-the-counter medications to alleviate his symptoms yet.   Sore Throat   Past Medical History:  Diagnosis Date   Arthritis    per patient   Back injury    per patient, history of ruptured or torn disk    Depression    Diabetes mellitus without complication (HCC)    Foot deformity    Gluten intolerance    self diagnosed, no h/o biopsy or blood testing   Hyperlipidemia    Low testosterone    Morbid obesity with BMI of 50.0-59.9, adult (HCC)    OSA on CPAP    Peptic ulcer    Shortness of breath    Sleep apnea    on CPAP   Spinal stenosis     Patient Active Problem List   Diagnosis Date Noted   Hypogonadism in male 05/14/2020   Dyslipidemia 05/14/2020   Type 2 diabetes mellitus with hyperglycemia, without long-term current use of insulin ([place]) 05/14/2020   Benign neoplasm of sigmoid colon    Moderate episode of recurrent major depressive disorder ([place]) 05/13/2018   Insomnia 03/07/2017   Hyperlipidemia associated with type 2 diabetes mellitus ([place]) 12/13/2016   HTN (hypertension) 12/13/2016   Colon cancer screening 06/01/2016   BPH (benign prostatic hyperplasia) 09/24/2015   Low testosterone 09/24/2015   Chronic joint pain 09/24/2015   Diabetes mellitus, type 2 ([place]) 11/22/2012   Morbid obesity with BMI of 50.0-59.9, adult ([place]) 11/22/2012   Sleep apnea 11/22/2012    Past Surgical History:  Procedure Laterality Date   BLADDER SURGERY     at 55 years old, patient unaware of details    COLONOSCOPY  2000?   COLONOSCOPY WITH PROPOFOL N/A 04/22/2019   Procedure: COLONOSCOPY WITH PROPOFOL;  Surgeon: Gatha Mayer, MD;  Location: [facility];  Service: Endoscopy;  Laterality: N/A;   ESOPHAGOGASTRODUODENOSCOPY     POLYPECTOMY  04/22/2019   Procedure: POLYPECTOMY;  Surgeon: Gatha Mayer, MD;  Location: [facility];  Service: Endoscopy;;   VASECTOMY  2019       Home Medications    Prior to Admission medications   Medication Sig Start Date End Date Taking? Authorizing Provider  azithromycin (ZITHROMAX) 500 MG tablet Take 1 tablet (500 mg total) by mouth daily for 5 days. 07/27/20 08/01/20 Yes Odis Luster, FNP  ALPRAZolam Duanne Moron) 0.5 MG tablet TAKE 1 TABLET BY MOUTH TWICE A DAY AS NEEDED FOR ANXIETY 07/04/19   [name], MD  atorvastatin (LIPITOR) 10 MG tablet TAKE 1 TABLET BY MOUTH EVERY DAY 05/27/20   [name], MD  buPROPion (WELLBUTRIN XL) 150 MG 24 hr tablet TAKE 1 TABLET BY MOUTH EVERY DAY 03/25/20   [name], MD  Continuous Blood Gluc Sensor ([place]) MISC DAILY 07/02/20   Shamleffer, Melanie Crazier, MD  Continuous Blood Gluc Transmit (DEXCOM G6 TRANSMITTER) MISC DAILY 07/02/20   Shamleffer, Melanie Crazier, MD  dapagliflozin propanediol Wilder [place])  10 MG TABS tablet Take 1 tablet (10 mg total) by mouth daily. 05/14/20   Shamleffer, Melanie Crazier, MD  fenofibrate 160 MG tablet TAKE 1 TABLET BY MOUTH EVERY DAY 05/26/19   [name], MD  FLUoxetine (PROZAC) 40 MG capsule Take 1 capsule (40 mg total) by mouth daily. 05/05/20   [name], MD  fluticasone (FLONASE) 50 MCG/ACT nasal spray Place 1 spray into both nostrils daily as needed for allergies or rhinitis. 12/13/18   Petrucelli, Samantha R, PA-C  glipiZIDE (GLUCOTROL) 10 MG tablet Take 1 tablet (10 mg total) by mouth 2 (two) times daily before a meal. 05/14/20   Shamleffer, Melanie Crazier, MD  HYDROcodone-acetaminophen (NORCO) 7.5-325 MG tablet Take 1 tablet by mouth  every 6 (six) hours as needed for moderate pain. 07/20/20   [name], MD  lisinopril (ZESTRIL) 2.5 MG tablet TAKE 1 TABLET BY MOUTH EVERY DAY AS DIRECTED 03/25/20   [name], MD  loratadine (CLARITIN) 10 MG tablet Take 10 mg by mouth daily.    [provider]  meloxicam (MOBIC) 15 MG tablet TAKE 1 TABLET BY MOUTH EVERY DAY 07/13/20   [name], MD  nitroGLYCERIN (NITROSTAT) 0.4 MG SL tablet PLACE 1 TABLET (0.4 MG TOTAL) UNDER THE TONGUE EVERY 5 (FIVE) MINUTES AS NEEDED FOR CHEST PAIN. 12/01/19   [name], MD  Semaglutide (RYBELSUS) 3 MG TABS Take 3 mg by mouth daily. 05/14/20   Shamleffer, Melanie Crazier, MD  tamsulosin (FLOMAX) 0.4 MG CAPS capsule TAKE 1 CAPSULE BY MOUTH EVERY DAY 05/26/19   [name], MD  testosterone cypionate (DEPOTESTOTERONE CYPIONATE) 100 MG/ML injection INJECT 1 ML (100 MG TOTAL) INTO THE MUSCLE EVERY 14 (FOURTEEN) DAYS. FOR IM USE ONLY 05/24/20   [name], MD  tiZANidine (ZANAFLEX) 4 MG tablet TAKE 1 TABLET EVERY 6 HOURS AS NEEDED FOR MUSCLE SPASMS 06/10/20   [name], MD  traZODone (DESYREL) 100 MG tablet TAKE 1 TABLET BY MOUTH EVERYDAY AT BEDTIME 02/09/20   [name], MD    Family History Family History  Problem Relation Age of Onset   Heart attack Mother    Heart failure Mother    Diabetes Mother    Heart attack Father    Hypertension Father    Colon polyps Father    Heart disease Brother    Cancer Brother    Stroke Brother    Bladder Cancer Brother    Diabetes Brother    Clotting disorder Paternal Aunt        Blood clot after minor surgery   Breast cancer Paternal Aunt    Clotting disorder Paternal Grandmother        Blood clot after minor surgery   Clotting disorder Maternal Grandmother    Cancer Maternal Grandmother    Diabetes Maternal Grandfather    Alcohol abuse Paternal Grandfather    Colon cancer Neg Hx    Esophageal cancer Neg Hx    Rectal cancer Neg Hx      Social History Social History   Tobacco Use   Smoking status: Former    Packs/day: 1.00    Years: 7.00    Pack years: 7.00    Types: Cigarettes    Quit date: 09/22/2012    Years since quitting: 7.8   Smokeless tobacco: Never  Vaping Use   Vaping Use: Never used  Substance Use Topics   Alcohol use: Yes    Comment: Rarely   Drug use:  No     Allergies   Penicillins, Ambien [zolpidem tartrate], Cetirizine, Gluten meal, Lidocaine, Pseudoephedrine hcl, Zolpidem, and Latex   Review of Systems Review of Systems Per HPI  Physical Exam Triage Vital Signs ED Triage Vitals  Enc Vitals Group     BP 07/27/20 1306 123/60     Pulse Rate 07/27/20 1304 (!) 56     Resp 07/27/20 1304 18     Temp 07/27/20 1304 98.2 F (36.8 C)     Temp Source 07/27/20 1304 Oral     SpO2 07/27/20 1304 98 %     Weight --      Height --      Head Circumference --      Peak Flow --      Pain Score 07/27/20 1301 3     Pain Loc --      Pain Edu? --      Excl. in [place]? --    No data found.  Updated Vital Signs BP 123/60   Pulse (!) 56   Temp 98.2 F (36.8 C) (Oral)   Resp 18   SpO2 98%   Visual Acuity Right Eye Distance:   Left Eye Distance:   Bilateral Distance:    Right Eye Near:   Left Eye Near:    Bilateral Near:     Physical Exam Constitutional:      General: He is not in acute distress.    Appearance: Normal appearance.  HENT:     Head: Normocephalic and atraumatic.     Right Ear: Ear canal normal. A middle ear effusion is present. Tympanic membrane is not erythematous.     Left Ear: Ear canal normal. A middle ear effusion is present. Tympanic membrane is not erythematous.     Nose: Mucosal edema and congestion present.     Mouth/Throat:     Mouth: Mucous membranes are moist.     Pharynx: Posterior oropharyngeal erythema present.     Tonsils: 1+ on the right. 1+ on the left.  Eyes:     Extraocular Movements: Extraocular movements intact.     Conjunctiva/sclera:  Conjunctivae normal.     Pupils: Pupils are equal, round, and reactive to light.  Cardiovascular:     Rate and Rhythm: Normal rate and regular rhythm.     Pulses: Normal pulses.     Heart sounds: Normal heart sounds.  Pulmonary:     Effort: Pulmonary effort is normal. No respiratory distress.     Breath sounds: Normal breath sounds. No wheezing.  Abdominal:     General: Abdomen is flat. Bowel sounds are normal.     Palpations: Abdomen is soft.  Musculoskeletal:        General: Normal range of motion.     Cervical back: Normal range of motion.  Skin:    General: Skin is warm and dry.  Neurological:     General: No focal deficit present.     Mental Status: He is alert and oriented to person, place, and time. Mental status is at baseline.  Psychiatric:        Mood and Affect: Mood normal.        Behavior: Behavior normal.     UC Treatments / Results  Labs (all labs ordered are listed, but only abnormal results are displayed) Labs Reviewed  POCT RAPID STREP A, ED / UC - Abnormal; Notable for the following components:      Result Value   Streptococcus, Group A Screen (Direct)  POSITIVE (*)    All other components within normal limits  SARS CORONAVIRUS 2 (TAT 6-24 HRS)    EKG   Radiology No results found.  Procedures Procedures (including critical care time)  Medications Ordered in UC Medications - No data to display  Initial Impression / Assessment and Plan / UC Course  I have reviewed the triage vital signs and the nursing notes.  Pertinent labs & imaging results that were available during my care of the patient were reviewed by me and considered in my medical decision making (see chart for details).     Will treat strep throat with azithromycin x5 days due to penicillin allergy.  Patient is not sure if he is ever taken any cephalosporins.  COVID-19 viral swab is pending.  Discussed over-the-counter remedies for sore throat including Cepacol lozenges.Discussed  strict return precautions. Patient verbalized understanding and is agreeable with plan.  Final Clinical Impressions(s) / UC Diagnoses   Final diagnoses:  Strep pharyngitis     Discharge Instructions      You are being treated for strep throat with antibiotics.  You may use over-the-counter Cepacol lozenges to alleviate sore throat.  COVID-19 viral swab is pending.  We will call if it is positive.  Please follow-up with primary care physician if needed.     ED Prescriptions     Medication Sig Dispense Auth. Provider   azithromycin (ZITHROMAX) 500 MG tablet Take 1 tablet (500 mg total) by mouth daily for 5 days. 5 tablet Odis Luster, FNP      PDMP not reviewed this encounter.   Odis Luster, FNP 07/27/20 1515

## 2020-07-27 NOTE — Discharge Instructions (Addendum)
You are being treated for strep throat with antibiotics.  You may use over-the-counter Cepacol lozenges to alleviate sore throat.  COVID-19 viral swab is pending.  We will call if it is positive.  Please follow-up with primary care physician if needed.

## 2020-07-27 NOTE — ED Triage Notes (Signed)
Pt reports sore throat, sinus congestion, and fatigue since this past Friday. States his wife recently had strep. Reports negative home covid test.

## 2020-08-01 ENCOUNTER — Other Ambulatory Visit: Payer: Self-pay | Admitting: Family Medicine

## 2020-08-04 ENCOUNTER — Other Ambulatory Visit: Payer: Self-pay | Admitting: Family Medicine

## 2020-08-07 ENCOUNTER — Other Ambulatory Visit: Payer: Self-pay | Admitting: Family Medicine

## 2020-08-09 ENCOUNTER — Other Ambulatory Visit: Payer: Self-pay | Admitting: Family Medicine

## 2020-08-09 NOTE — Telephone Encounter (Signed)
LFD 07/13/20 #30 with no refills LOV 05/05/20 NOV none

## 2020-08-10 NOTE — Telephone Encounter (Signed)
LFD 05/24/20 #25m with no refills LOV 05/05/20 NOV none

## 2020-08-12 ENCOUNTER — Other Ambulatory Visit: Payer: Self-pay | Admitting: Family Medicine

## 2020-08-12 DIAGNOSIS — G4700 Insomnia, unspecified: Secondary | ICD-10-CM | POA: Diagnosis not present

## 2020-08-12 DIAGNOSIS — F5101 Primary insomnia: Secondary | ICD-10-CM | POA: Diagnosis not present

## 2020-08-12 DIAGNOSIS — G4733 Obstructive sleep apnea (adult) (pediatric): Secondary | ICD-10-CM | POA: Diagnosis not present

## 2020-08-12 DIAGNOSIS — F419 Anxiety disorder, unspecified: Secondary | ICD-10-CM | POA: Diagnosis not present

## 2020-08-12 DIAGNOSIS — Z9989 Dependence on other enabling machines and devices: Secondary | ICD-10-CM | POA: Diagnosis not present

## 2020-08-12 DIAGNOSIS — F4310 Post-traumatic stress disorder, unspecified: Secondary | ICD-10-CM | POA: Diagnosis not present

## 2020-08-12 DIAGNOSIS — F411 Generalized anxiety disorder: Secondary | ICD-10-CM | POA: Diagnosis not present

## 2020-08-12 NOTE — Telephone Encounter (Signed)
Requesting:Norco 7.5 325 Contract: UDS: Last Visit:05/05/20 Next Visit:n/a Last Refill:07/20/20 120 tabs 0 refills   Please Advise

## 2020-08-13 ENCOUNTER — Encounter: Payer: Self-pay | Admitting: Internal Medicine

## 2020-08-13 ENCOUNTER — Telehealth: Payer: Self-pay | Admitting: Internal Medicine

## 2020-08-13 ENCOUNTER — Other Ambulatory Visit: Payer: Self-pay

## 2020-08-13 ENCOUNTER — Ambulatory Visit: Payer: BC Managed Care – PPO | Admitting: Internal Medicine

## 2020-08-13 VITALS — BP 128/76 | HR 60 | Ht 66.0 in | Wt 338.5 lb

## 2020-08-13 DIAGNOSIS — E119 Type 2 diabetes mellitus without complications: Secondary | ICD-10-CM | POA: Diagnosis not present

## 2020-08-13 DIAGNOSIS — E291 Testicular hypofunction: Secondary | ICD-10-CM

## 2020-08-13 LAB — TESTOSTERONE: Testosterone: 280 ng/dL — ABNORMAL LOW (ref 300.00–890.00)

## 2020-08-13 LAB — POCT GLYCOSYLATED HEMOGLOBIN (HGB A1C): Hemoglobin A1C: 6.9 % — AB (ref 4.0–5.6)

## 2020-08-13 LAB — MICROALBUMIN / CREATININE URINE RATIO
Creatinine,U: 136.7 mg/dL
Microalb Creat Ratio: 0.5 mg/g (ref 0.0–30.0)
Microalb, Ur: <0.7 mg/dL (ref 0.0–1.9)

## 2020-08-13 LAB — POCT GLUCOSE (DEVICE FOR HOME USE): Glucose Fasting, POC: 109 mg/dL — AB (ref 70–99)

## 2020-08-13 LAB — PSA: PSA: 0.08 ng/mL — ABNORMAL LOW (ref 0.10–4.00)

## 2020-08-13 LAB — LUTEINIZING HORMONE: LH: 1.33 m[IU]/mL — ABNORMAL LOW (ref 1.50–9.30)

## 2020-08-13 MED ORDER — FREESTYLE LIBRE 2 SENSOR MISC: 1.0000 | 11 refills | Status: DC

## 2020-08-13 MED ORDER — RYBELSUS 7 MG PO TABS: 7.0000 mg | ORAL_TABLET | Freq: Every day | ORAL | 3 refills | Status: DC

## 2020-08-13 NOTE — Patient Instructions (Addendum)
-   Keep Up the Good Work ! A1c 6.9 % ( Down from 9.9%) - Continue  Glipizide 10 mg, 1 tablet Before Breakfast and Supper ( preferably 20-30 minutes before the meal) - Increase  Rybelsus 7 mg, 1 tablet before breakfast ( preferably 30 minutes before a meal) - Continue Farxiga to 10 mg, 1 tablet before breakfast      HOW TO TREAT LOW BLOOD SUGARS (Blood sugar LESS THAN 70 MG/DL) Please follow the RULE OF 15 for the treatment of hypoglycemia treatment (when your (blood sugars are less than 70 mg/dL)   STEP 1: Take 15 grams of carbohydrates when your blood sugar is low, which includes:  3-4 GLUCOSE TABS  OR 3-4 OZ OF JUICE OR REGULAR SODA OR ONE TUBE OF GLUCOSE GEL    STEP 2: RECHECK blood sugar in 15 MINUTES STEP 3: If your blood sugar is still low at the 15 minute recheck --> then, go back to STEP 1 and treat AGAIN with another 15 grams of carbohydrates.

## 2020-08-13 NOTE — Progress Notes (Signed)
Name: Caleb White.  Age/ Sex: 55 y.o., male   MRN/ DOB: [id number], [birth year]     PCP: [name], MD   Reason for Endocrinology Evaluation: Type 2 Diabetes Mellitus  Initial Endocrine Consultative Visit: 05/14/2020    PATIENT IDENTIFIER: [name]. is a 55 y.o. male with a past medical history of T2DM, HTN and Dyslipidemia. The patient has followed with Endocrinology clinic since 05/14/2020 for consultative assistance with management of his diabetes.  DIABETIC HISTORY:  Caleb White was diagnosed with DM in 2014, has been on glipizide and farxiga, no prior insulin use. His hemoglobin A1c has ranged from 6.3% in 2018, peaking at 9.9% in 2022   On his initial visit to our clinic his A1c 9.9% , we continued Glipizide and farxiga and started Rybelsus  HYPOGONADISM HISTORY:  Has history of hypogonadism, he was on testosterone replacement years ago but restarted by his PCP in 10/2019 with a testosterone level  of 135 ng/dLcurrently on testosterone cypionate , wife injects testosterone  1 mL every 2 weeks   History of non-compliance in the past    Per pt insurance will not cover weight loss surgery    Has depression and he is an emotional eating , on Prozac started therapy 2022   He is on chronic hydrocodone for severe pain  , he is also on Alprazolam  A few times a week.     SUBJECTIVE:   During the last visit (4/29/222): A1c 9.9% , increased Fargiga, continue Glipizide and started Rybelsus       Today (08/13/2020): Caleb White is here for a follow up on diabetes management .He checks his blood sugars occasionally . The patient has not had hypoglycemic episodes.  Denies nausea or diarrhea   Dexcom is not covered by insurance  Started Therapy  Has been doing better with testosterone injections     HOME ENDOCRINE  REGIMEN:  Glipizide 10 mg, 1 tablet Before Breakfast and Supper Rybelsus 3 mg, 1 tablet before breakfast Farxiga 10 mg, 1  tablet before breakfast  Testosterone 100 mg Q 14 days     Statin: yes ACE-I/ARB: yes Prior Diabetic Education: yes   METER DOWNLOAD SUMMARY: did not bring     DIABETIC COMPLICATIONS: Microvascular complications:   Denies: CKD, neuropathy, retinopathy Last Eye Exam: Completed 2020  Macrovascular complications:   Denies: CAD, CVA, PVD   HISTORY:  Past Medical History:  Past Medical History:  Diagnosis Date   Arthritis    per patient   Back injury    per patient, history of ruptured or torn disk    Depression    Diabetes mellitus without complication (HCC)    Foot deformity    Gluten intolerance    self diagnosed, no h/o biopsy or blood testing   Hyperlipidemia    Low testosterone    Morbid obesity with BMI of 50.0-59.9, adult (HCC)    OSA on CPAP    Peptic ulcer    Shortness of breath    Sleep apnea    on CPAP   Spinal stenosis    Past Surgical History:  Past Surgical History:  Procedure Laterality Date   BLADDER SURGERY     at 55 years old, patient unaware of details   COLONOSCOPY  2000?   COLONOSCOPY WITH PROPOFOL N/A 04/22/2019   Procedure: COLONOSCOPY WITH PROPOFOL;  Surgeon: Gatha Mayer, MD;  [facility];  Service: Endoscopy;  Laterality: N/A;   ESOPHAGOGASTRODUODENOSCOPY  POLYPECTOMY  04/22/2019   Procedure: POLYPECTOMY;  Surgeon: Gatha Mayer, MD;  Location: [facility];  Service: Endoscopy;;   VASECTOMY  2019   Social History:  reports that he quit smoking about 7 years ago. His smoking use included cigarettes. He has a 7.00 pack-year smoking history. He has never used smokeless tobacco. He reports current alcohol use. He reports that he does not use drugs. Family History:  Family History  Problem Relation Age of Onset   Heart attack Mother    Heart failure Mother    Diabetes Mother    Heart attack Father    Hypertension Father    Colon polyps Father    Heart disease Brother    Cancer Brother    Stroke Brother     Bladder Cancer Brother    Diabetes Brother    Clotting disorder Paternal Aunt        Blood clot after minor surgery   Breast cancer Paternal Aunt    Clotting disorder Paternal Grandmother        Blood clot after minor surgery   Clotting disorder Maternal Grandmother    Cancer Maternal Grandmother    Diabetes Maternal Grandfather    Alcohol abuse Paternal Grandfather    Colon cancer Neg Hx    Esophageal cancer Neg Hx    Rectal cancer Neg Hx      HOME MEDICATIONS: Allergies as of 08/13/2020       Reactions   Penicillins Anaphylaxis   Did it involve swelling of the face/tongue/throat, SOB, or low BP? No Did it involve sudden or severe rash/hives, skin peeling, or any reaction on the inside of your mouth or nose? no Did you need to seek medical attention at a hospital or doctor's office? No When did it last happen?      Middle school If all above answers are "NO", may proceed with cephalosporin use.   Ambien [zolpidem Tartrate] Other (See Comments)   nightmares   Cetirizine Other (See Comments)   Makes feel "drunk"   Gluten Meal Other (See Comments)   Diarrhea and headache   Lidocaine Swelling, Other (See Comments)   Redness that spreads.  PATIENT CAN USE PRESERVATIVE FREE LIDOCAINE Non preservative there is no problem   Pseudoephedrine Hcl Other (See Comments)   Makes feel "drunk"   Zolpidem Other (See Comments)   nightmares   Latex Itching, Swelling, Rash        Medication List        Accurate as of August 13, 2020 10:36 AM. If you have any questions, ask your nurse or doctor.          ALPRAZolam 0.5 MG tablet Commonly known as: XANAX TAKE 1 TABLET BY MOUTH TWICE A DAY AS NEEDED FOR ANXIETY   atorvastatin 10 MG tablet Commonly known as: LIPITOR TAKE 1 TABLET BY MOUTH EVERY DAY   buPROPion 150 MG 24 hr tablet Commonly known as: WELLBUTRIN XL TAKE 1 TABLET BY MOUTH EVERY DAY   dapagliflozin propanediol 10 MG Tabs tablet Commonly known as: Farxiga Take 1  tablet (10 mg total) by mouth daily.   Dexcom G6 Sensor Misc DAILY   Dexcom G6 Transmitter Misc DAILY   fenofibrate 160 MG tablet TAKE 1 TABLET BY MOUTH EVERY DAY   FLUoxetine 40 MG capsule Commonly known as: PROZAC Take 1 capsule (40 mg total) by mouth daily.   fluticasone 50 MCG/ACT nasal spray Commonly known as: FLONASE Place 1 spray into both nostrils daily as needed  for allergies or rhinitis.   glipiZIDE 10 MG tablet Commonly known as: GLUCOTROL Take 1 tablet (10 mg total) by mouth 2 (two) times daily before a meal.   HYDROcodone-acetaminophen 7.5-325 MG tablet Commonly known as: NORCO Take 1 tablet by mouth every 6 (six) hours as needed for moderate pain.   lisinopril 2.5 MG tablet Commonly known as: ZESTRIL TAKE 1 TABLET BY MOUTH EVERY DAY AS DIRECTED   loratadine 10 MG tablet Commonly known as: CLARITIN Take 10 mg by mouth daily.   meloxicam 15 MG tablet Commonly known as: MOBIC TAKE 1 TABLET BY MOUTH EVERY DAY   nitroGLYCERIN 0.4 MG SL tablet Commonly known as: NITROSTAT PLACE 1 TABLET (0.4 MG TOTAL) UNDER THE TONGUE EVERY 5 (FIVE) MINUTES AS NEEDED FOR CHEST PAIN.   Rybelsus 3 MG Tabs Generic drug: Semaglutide Take 3 mg by mouth daily.   tamsulosin 0.4 MG Caps capsule Commonly known as: FLOMAX TAKE 1 CAPSULE BY MOUTH EVERY DAY   testosterone cypionate 100 MG/ML injection Commonly known as: DEPOTESTOTERONE CYPIONATE INJECT 1 ML (100 MG TOTAL) INTO THE MUSCLE EVERY 14 (FOURTEEN) DAYS. FOR IM USE ONLY   tiZANidine 4 MG tablet Commonly known as: ZANAFLEX TAKE 1 TABLET EVERY 6 HOURS AS NEEDED FOR MUSCLE SPASMS   traZODone 100 MG tablet Commonly known as: DESYREL TAKE 1 TABLET BY MOUTH EVERYDAY AT BEDTIME What changed: See the new instructions.         OBJECTIVE:   Vital Signs: BP 128/76   Pulse 60   Ht '5\' 6"'$  (1.676 m)   Wt (!) 338 lb 8 oz (153.5 kg)   SpO2 98%   BMI 54.64 kg/m   Wt Readings from Last 3 Encounters:  08/13/20 (!) 338  lb 8 oz (153.5 kg)  05/14/20 (!) 346 lb 8 oz (157.2 kg)  05/05/20 (!) 346 lb 3.2 oz (157 kg)     Exam: General: Pt appears well and is in NAD  Lungs: Clear with good BS bilat with no rales, rhonchi, or wheezes  Heart: RRR with normal S1 and S2 and no gallops; no murmurs; no rub  Abdomen: Normoactive bowel sounds, soft, nontender, without masses or organomegaly palpable  Extremities: Trace  pretibial edema  Neuro: MS is good with appropriate affect, pt is alert and Ox3      DATA REVIEWED:  Lab Results  Component Value Date   HGBA1C 6.9 (A) 08/13/2020   HGBA1C 9.9 (H) 05/05/2020   HGBA1C 9.3 (H) 11/04/2019   Lab Results  Component Value Date   MICROALBUR 3.0 12/09/2012   LDLCALC 42 05/05/2020   CREATININE 0.80 05/05/2020      Lab Results  Component Value Date   CHOL 118 05/05/2020   HDL 47.20 05/05/2020   LDLCALC 42 05/05/2020   LDLDIRECT 81.0 04/11/2017   TRIG 148.0 05/05/2020   CHOLHDL 3 05/05/2020       Results for Hur, HILMER LITWAK "[place]" (MRN [id number]) as of 08/16/2020 12:07  Ref. Range 08/13/2020 10:58  MICROALB/CREAT RATIO Latest Ref Range: 0.0 - 30.0 mg/g 0.5  LH Latest Ref Range: 1.50 - 9.30 mIU/mL 1.33 (L)  Prolactin Latest Ref Range: 2.0 - 18.0 ng/mL 11.4  Testosterone Latest Ref Range: 300.00 - 890.00 ng/dL 280.00 (L)  PSA Latest Ref Range: 0.10 - 4.00 ng/mL 0.08 (L)  Creatinine,U Latest Units: mg/dL 136.7  Microalb, Ur Latest Ref Range: 0.0 - 1.9 mg/dL <0.7    ASSESSMENT / PLAN / RECOMMENDATIONS:   1) Type 2 Diabetes Mellitus,  Optimally controlled, With out complications - Most recent A1c of 6.9 %. Goal A1c < 7.0 %.    - A1c down from 9.9%  - Praised pt on improved glycemic control and weight loss  - Will increase Rybelsus, I explained to him that will probably need to decrease Glipizide, he will notify me with BG's in 70's or less so we would decrease Glipizide dose  - Dexcom is not covered, will prescribe freestyle libre 2 , samples  provided   MEDICATIONS: Continue  Glipizide 10 mg, 1 tablet Before Breakfast and Supper ( preferably 20-30 minutes before the meal)  Increase  Rybelsus 7 mg, 1 tablet before breakfast ( preferably 30 minutes before a meal) Continue Farxiga 10 mg, 1 tablet before breakfast  EDUCATION / INSTRUCTIONS: BG monitoring instructions: Patient is instructed to check his blood sugars 1 times a day, fasting . Call [facility] if: BG persistently < 70  I reviewed the Rule of 15 for the treatment of hypoglycemia in detail with the patient. Literature supplied.     2) Diabetic complications:  Eye: Does not have known diabetic retinopathy.  Neuro/ Feet: Does not have known diabetic peripheral neuropathy .  Renal: Patient does not have known baseline CKD. He   is  on an ACEI/ARB at present.    3) Hypogonadism:   - He has been doing better with testosterone intake - We also discussed topical alternatives in the future if compliance is an issue. - testosterone levels trending up  - CBC , PSA and prolactin are normal    Continue testosterone cypionate 1 mL (100 mg) Q 14 days   F/U in 4 months     Signed electronically by: Mack Guise, MD  [facility]  [address], [place] [address] Phone: [phone] FAX: [phone]   CC: [name], MD [address] [place] [address] Phone: [phone]  Fax: [phone]  Return to Endocrinology clinic as below: No future appointments.

## 2020-08-13 NOTE — Telephone Encounter (Signed)
Pt is requesting that we send over the prescription for the   Continuous Blood Gluc Sensor (FREESTYLE LIBRE 2 SENSOR) MISC ]   [id number] [place], [place] - [address]

## 2020-08-14 LAB — PROLACTIN: Prolactin: 11.4 ng/mL (ref 2.0–18.0)

## 2020-08-16 ENCOUNTER — Other Ambulatory Visit: Payer: Self-pay

## 2020-08-16 MED ORDER — FREESTYLE LIBRE 2 SENSOR MISC: 1.0000 | 11 refills | Status: DC

## 2020-08-16 NOTE — Telephone Encounter (Signed)
[facility] , informed pt that refill was sent in 08/13/20 with 11 refills to [facility]. Re sent rx to pharmacy per patient request.

## 2020-08-17 ENCOUNTER — Other Ambulatory Visit: Payer: Self-pay | Admitting: Family Medicine

## 2020-08-21 ENCOUNTER — Encounter: Payer: Self-pay | Admitting: Family Medicine

## 2020-08-23 ENCOUNTER — Other Ambulatory Visit: Payer: Self-pay | Admitting: Family

## 2020-08-23 DIAGNOSIS — F4310 Post-traumatic stress disorder, unspecified: Secondary | ICD-10-CM | POA: Diagnosis not present

## 2020-08-23 DIAGNOSIS — F411 Generalized anxiety disorder: Secondary | ICD-10-CM | POA: Diagnosis not present

## 2020-08-23 DIAGNOSIS — F5101 Primary insomnia: Secondary | ICD-10-CM | POA: Diagnosis not present

## 2020-08-23 MED ORDER — HYDROCODONE-ACETAMINOPHEN 7.5-325 MG PO TABS: 1.0000 | ORAL_TABLET | Freq: Four times a day (QID) | ORAL | 0 refills | Status: DC | PRN

## 2020-08-31 DIAGNOSIS — Q663 Other congenital varus deformities of feet, unspecified foot: Secondary | ICD-10-CM | POA: Diagnosis not present

## 2020-08-31 DIAGNOSIS — R600 Localized edema: Secondary | ICD-10-CM | POA: Diagnosis not present

## 2020-09-01 DIAGNOSIS — F5101 Primary insomnia: Secondary | ICD-10-CM | POA: Diagnosis not present

## 2020-09-01 DIAGNOSIS — F4310 Post-traumatic stress disorder, unspecified: Secondary | ICD-10-CM | POA: Diagnosis not present

## 2020-09-01 DIAGNOSIS — F411 Generalized anxiety disorder: Secondary | ICD-10-CM | POA: Diagnosis not present

## 2020-09-06 ENCOUNTER — Other Ambulatory Visit: Payer: Self-pay | Admitting: Family

## 2020-09-06 ENCOUNTER — Other Ambulatory Visit: Payer: Self-pay | Admitting: Family Medicine

## 2020-09-06 DIAGNOSIS — F411 Generalized anxiety disorder: Secondary | ICD-10-CM | POA: Diagnosis not present

## 2020-09-06 DIAGNOSIS — F4310 Post-traumatic stress disorder, unspecified: Secondary | ICD-10-CM | POA: Diagnosis not present

## 2020-09-06 DIAGNOSIS — F5101 Primary insomnia: Secondary | ICD-10-CM | POA: Diagnosis not present

## 2020-09-07 DIAGNOSIS — H52202 Unspecified astigmatism, left eye: Secondary | ICD-10-CM | POA: Diagnosis not present

## 2020-09-07 DIAGNOSIS — H5213 Myopia, bilateral: Secondary | ICD-10-CM | POA: Diagnosis not present

## 2020-09-07 DIAGNOSIS — H524 Presbyopia: Secondary | ICD-10-CM | POA: Diagnosis not present

## 2020-09-07 DIAGNOSIS — E119 Type 2 diabetes mellitus without complications: Secondary | ICD-10-CM | POA: Diagnosis not present

## 2020-09-07 DIAGNOSIS — Z7984 Long term (current) use of oral hypoglycemic drugs: Secondary | ICD-10-CM | POA: Diagnosis not present

## 2020-09-12 ENCOUNTER — Other Ambulatory Visit: Payer: Self-pay | Admitting: Family Medicine

## 2020-09-13 ENCOUNTER — Encounter: Payer: Self-pay | Admitting: Family Medicine

## 2020-09-13 ENCOUNTER — Other Ambulatory Visit: Payer: Self-pay

## 2020-09-13 MED ORDER — HYDROCODONE-ACETAMINOPHEN 7.5-325 MG PO TABS: 1.0000 | ORAL_TABLET | Freq: Four times a day (QID) | ORAL | 0 refills | Status: DC | PRN

## 2020-09-13 NOTE — Telephone Encounter (Signed)
Request refill for HYDROcodone please  LFD 08/23/20 #120 with no refills LOV 05/05/20 NOV none

## 2020-09-16 DIAGNOSIS — F5101 Primary insomnia: Secondary | ICD-10-CM | POA: Diagnosis not present

## 2020-09-16 DIAGNOSIS — F411 Generalized anxiety disorder: Secondary | ICD-10-CM | POA: Diagnosis not present

## 2020-09-16 DIAGNOSIS — F4310 Post-traumatic stress disorder, unspecified: Secondary | ICD-10-CM | POA: Diagnosis not present

## 2020-10-05 DIAGNOSIS — F4310 Post-traumatic stress disorder, unspecified: Secondary | ICD-10-CM | POA: Diagnosis not present

## 2020-10-05 DIAGNOSIS — F411 Generalized anxiety disorder: Secondary | ICD-10-CM | POA: Diagnosis not present

## 2020-10-05 DIAGNOSIS — F5101 Primary insomnia: Secondary | ICD-10-CM | POA: Diagnosis not present

## 2020-10-13 ENCOUNTER — Other Ambulatory Visit: Payer: Self-pay | Admitting: Family Medicine

## 2020-10-14 ENCOUNTER — Encounter: Payer: Self-pay | Admitting: Family Medicine

## 2020-10-14 ENCOUNTER — Other Ambulatory Visit: Payer: Self-pay

## 2020-10-14 NOTE — Telephone Encounter (Signed)
Requesting:Hydrocodone 7.5-325mg  Contract: UDS: Last Visit:05/05/20 Next Visit:n/a Last Refill:09/13/20 120 tabs 0 refills  Please Advise

## 2020-10-15 MED ORDER — HYDROCODONE-ACETAMINOPHEN 7.5-325 MG PO TABS: 1.0000 | ORAL_TABLET | Freq: Four times a day (QID) | ORAL | 0 refills | Status: DC | PRN

## 2020-10-21 DIAGNOSIS — F411 Generalized anxiety disorder: Secondary | ICD-10-CM | POA: Diagnosis not present

## 2020-10-21 DIAGNOSIS — F4310 Post-traumatic stress disorder, unspecified: Secondary | ICD-10-CM | POA: Diagnosis not present

## 2020-10-21 DIAGNOSIS — F5101 Primary insomnia: Secondary | ICD-10-CM | POA: Diagnosis not present

## 2020-10-25 DIAGNOSIS — F5101 Primary insomnia: Secondary | ICD-10-CM | POA: Diagnosis not present

## 2020-10-25 DIAGNOSIS — F411 Generalized anxiety disorder: Secondary | ICD-10-CM | POA: Diagnosis not present

## 2020-10-25 DIAGNOSIS — F4310 Post-traumatic stress disorder, unspecified: Secondary | ICD-10-CM | POA: Diagnosis not present

## 2020-11-01 ENCOUNTER — Other Ambulatory Visit: Payer: Self-pay | Admitting: Family Medicine

## 2020-11-01 ENCOUNTER — Other Ambulatory Visit: Payer: Self-pay | Admitting: Internal Medicine

## 2020-11-01 ENCOUNTER — Other Ambulatory Visit: Payer: Self-pay | Admitting: Family

## 2020-11-01 NOTE — Telephone Encounter (Signed)
Last seen: 05/05/2020 No upcoming appt.   Refill for prozac, please advsie

## 2020-11-02 DIAGNOSIS — F5101 Primary insomnia: Secondary | ICD-10-CM | POA: Diagnosis not present

## 2020-11-02 DIAGNOSIS — F4310 Post-traumatic stress disorder, unspecified: Secondary | ICD-10-CM | POA: Diagnosis not present

## 2020-11-02 DIAGNOSIS — F411 Generalized anxiety disorder: Secondary | ICD-10-CM | POA: Diagnosis not present

## 2020-11-07 ENCOUNTER — Other Ambulatory Visit: Payer: Self-pay | Admitting: Family Medicine

## 2020-11-08 ENCOUNTER — Encounter: Payer: Self-pay | Admitting: Family Medicine

## 2020-11-09 DIAGNOSIS — F4310 Post-traumatic stress disorder, unspecified: Secondary | ICD-10-CM | POA: Diagnosis not present

## 2020-11-09 DIAGNOSIS — F411 Generalized anxiety disorder: Secondary | ICD-10-CM | POA: Diagnosis not present

## 2020-11-09 DIAGNOSIS — F5101 Primary insomnia: Secondary | ICD-10-CM | POA: Diagnosis not present

## 2020-11-11 ENCOUNTER — Other Ambulatory Visit: Payer: Self-pay | Admitting: Internal Medicine

## 2020-11-12 DIAGNOSIS — G4733 Obstructive sleep apnea (adult) (pediatric): Secondary | ICD-10-CM | POA: Diagnosis not present

## 2020-11-15 DIAGNOSIS — F411 Generalized anxiety disorder: Secondary | ICD-10-CM | POA: Diagnosis not present

## 2020-11-15 DIAGNOSIS — F4310 Post-traumatic stress disorder, unspecified: Secondary | ICD-10-CM | POA: Diagnosis not present

## 2020-11-15 DIAGNOSIS — F5101 Primary insomnia: Secondary | ICD-10-CM | POA: Diagnosis not present

## 2020-11-22 ENCOUNTER — Other Ambulatory Visit: Payer: Self-pay | Admitting: Family Medicine

## 2020-11-23 DIAGNOSIS — F5101 Primary insomnia: Secondary | ICD-10-CM | POA: Diagnosis not present

## 2020-11-23 DIAGNOSIS — F4312 Post-traumatic stress disorder, chronic: Secondary | ICD-10-CM | POA: Diagnosis not present

## 2020-11-23 DIAGNOSIS — F411 Generalized anxiety disorder: Secondary | ICD-10-CM | POA: Diagnosis not present

## 2020-11-23 NOTE — Telephone Encounter (Signed)
UTD on visits, has an upcoming one on 11/15. Please escribe

## 2020-11-24 MED ORDER — HYDROCODONE-ACETAMINOPHEN 7.5-325 MG PO TABS: 1.0000 | ORAL_TABLET | Freq: Four times a day (QID) | ORAL | 0 refills | Status: DC | PRN

## 2020-11-30 ENCOUNTER — Telehealth (INDEPENDENT_AMBULATORY_CARE_PROVIDER_SITE_OTHER): Payer: BC Managed Care – PPO | Admitting: Family Medicine

## 2020-11-30 ENCOUNTER — Encounter: Payer: Self-pay | Admitting: Family Medicine

## 2020-11-30 DIAGNOSIS — M2550 Pain in unspecified joint: Secondary | ICD-10-CM

## 2020-11-30 DIAGNOSIS — J111 Influenza due to unidentified influenza virus with other respiratory manifestations: Secondary | ICD-10-CM | POA: Diagnosis not present

## 2020-11-30 DIAGNOSIS — G8929 Other chronic pain: Secondary | ICD-10-CM | POA: Diagnosis not present

## 2020-11-30 DIAGNOSIS — F411 Generalized anxiety disorder: Secondary | ICD-10-CM | POA: Diagnosis not present

## 2020-11-30 DIAGNOSIS — F4312 Post-traumatic stress disorder, chronic: Secondary | ICD-10-CM | POA: Diagnosis not present

## 2020-11-30 DIAGNOSIS — F5101 Primary insomnia: Secondary | ICD-10-CM | POA: Diagnosis not present

## 2020-11-30 MED ORDER — OSELTAMIVIR PHOSPHATE 75 MG PO CAPS: 75.0000 mg | ORAL_CAPSULE | Freq: Two times a day (BID) | ORAL | 0 refills | Status: DC

## 2020-11-30 NOTE — Progress Notes (Signed)
Virtual Visit via Video   I connected with patient on 11/30/20 at  9:30 AM EST by a video enabled telemedicine application and verified that I am speaking with the correct person using two identifiers.  Location patient: Home Location provider: [facility] Persons participating in the virtual visit: Patient, Provider, [place] [employer] [place] C)  I discussed the limitations of evaluation and management by telemedicine and the availability of in person appointments. The patient expressed understanding and agreed to proceed.  Subjective:   HPI:   URI- 'i think I have the flu'.  Sxs started Sunday w/ sore throat.  Last night developed body aches, chills, cough, sneezing, congestion.  + flu exposure.  Chronic joint pain- ongoing issue for pt.  Maintained on routine opioid medication.  Needs new handicapped placard to use on 'bad days'.    ROS:   See pertinent positives and negatives per HPI.  Patient Active Problem List   Diagnosis Date Noted   Hypogonadism in male 05/14/2020   Dyslipidemia 05/14/2020   Type 2 diabetes mellitus with hyperglycemia, without long-term current use of insulin ([place]) 05/14/2020   Benign neoplasm of sigmoid colon    Moderate episode of recurrent major depressive disorder (HCC) 05/13/2018   Insomnia 03/07/2017   Hyperlipidemia associated with type 2 diabetes mellitus ([place]) 12/13/2016   HTN (hypertension) 12/13/2016   Colon cancer screening 06/01/2016   BPH (benign prostatic hyperplasia) 09/24/2015   Low testosterone 09/24/2015   Chronic joint pain 09/24/2015   Diabetes mellitus, type 2 ([place]) 11/22/2012   Morbid obesity with BMI of 50.0-59.9, adult ([place]) 11/22/2012   Sleep apnea 11/22/2012    Social History   Tobacco Use   Smoking status: Former    Packs/day: 1.00    Years: 7.00    Pack years: 7.00    Types: Cigarettes    Quit date: 09/22/2012    Years since quitting: 8.1   Smokeless tobacco: Never  Substance Use Topics   Alcohol use: Yes     Comment: Rarely    Current Outpatient Medications:    ALPRAZolam (XANAX) 0.5 MG tablet, TAKE 1 TABLET BY MOUTH TWICE A DAY AS NEEDED FOR ANXIETY, Disp: 60 tablet, Rfl: 1   atorvastatin (LIPITOR) 10 MG tablet, TAKE 1 TABLET BY MOUTH EVERY DAY, Disp: 90 tablet, Rfl: 1   buPROPion (WELLBUTRIN XL) 150 MG 24 hr tablet, TAKE 1 TABLET BY MOUTH EVERY DAY, Disp: 90 tablet, Rfl: 1   Continuous Blood Gluc Sensor (FREESTYLE LIBRE 2 SENSOR) MISC, 1 Device by Does not apply route every 14 (fourteen) days., Disp: 2 each, Rfl: 11   FARXIGA 10 MG TABS tablet, TAKE 1 TABLET BY MOUTH EVERY DAY, Disp: 90 tablet, Rfl: 1   fenofibrate 160 MG tablet, TAKE 1 TABLET BY MOUTH EVERY DAY, Disp: 90 tablet, Rfl: 1   FLUoxetine (PROZAC) 40 MG capsule, TAKE 1 CAPSULE (40 MG TOTAL) BY MOUTH DAILY., Disp: 90 capsule, Rfl: 0   fluticasone (FLONASE) 50 MCG/ACT nasal spray, Place 1 spray into both nostrils daily as needed for allergies or rhinitis., Disp: 16 g, Rfl: 0   glipiZIDE (GLUCOTROL) 10 MG tablet, TAKE 1 TABLET (10 MG TOTAL) BY MOUTH 2 (TWO) TIMES DAILY BEFORE A MEAL., Disp: 180 tablet, Rfl: 1   HYDROcodone-acetaminophen (NORCO) 7.5-325 MG tablet, Take 1 tablet by mouth every 6 (six) hours as needed for moderate pain., Disp: 120 tablet, Rfl: 0   lisinopril (ZESTRIL) 2.5 MG tablet, TAKE 1 TABLET BY MOUTH EVERY DAY AS DIRECTED, Disp: 90  tablet, Rfl: 1   loratadine (CLARITIN) 10 MG tablet, Take 10 mg by mouth daily., Disp: , Rfl:    meloxicam (MOBIC) 15 MG tablet, TAKE 1 TABLET BY MOUTH EVERY DAY, Disp: 30 tablet, Rfl: 0   nitroGLYCERIN (NITROSTAT) 0.4 MG SL tablet, PLACE 1 TABLET UNDER THE TONGUE EVERY 5 MINUTES AS NEEDED FOR CHEST PAIN., Disp: 75 tablet, Rfl: 1   Semaglutide (RYBELSUS) 7 MG TABS, Take 7 mg by mouth daily., Disp: 90 tablet, Rfl: 3   tamsulosin (FLOMAX) 0.4 MG CAPS capsule, TAKE 1 CAPSULE BY MOUTH EVERY DAY, Disp: 90 capsule, Rfl: 1   testosterone cypionate (DEPOTESTOTERONE CYPIONATE) 100 MG/ML injection,  INJECT 1 ML (100 MG TOTAL) INTO THE MUSCLE EVERY 14 (FOURTEEN) DAYS. FOR IM USE ONLY, Disp: 10 mL, Rfl: 0   tiZANidine (ZANAFLEX) 4 MG tablet, TAKE 1 TABLET EVERY 6 HOURS AS NEEDED FOR MUSCLE SPASMS, Disp: 45 tablet, Rfl: 0   traZODone (DESYREL) 100 MG tablet, TAKE 1 TABLET BY MOUTH EVERYDAY AT BEDTIME (Patient taking differently: 150 mg.), Disp: 90 tablet, Rfl: 2  Allergies  Allergen Reactions   Penicillins Anaphylaxis    Did it involve swelling of the face/tongue/throat, SOB, or low BP? No Did it involve sudden or severe rash/hives, skin peeling, or any reaction on the inside of your mouth or nose? no Did you need to seek medical attention at a hospital or doctor's office? No When did it last happen?      Middle school If all above answers are "NO", may proceed with cephalosporin use.   Ambien [Zolpidem Tartrate] Other (See Comments)    nightmares   Cetirizine Other (See Comments)    Makes feel "drunk"   Gluten Meal Other (See Comments)    Diarrhea and headache   Lidocaine Swelling and Other (See Comments)    Redness that spreads.  PATIENT CAN USE PRESERVATIVE FREE LIDOCAINE Non preservative there is no problem   Pseudoephedrine Hcl Other (See Comments)    Makes feel "drunk"   Zolpidem Other (See Comments)    nightmares   Latex Itching, Swelling and Rash    Objective:   There were no vitals taken for this visit. AAOx3, NAD NCAT, EOMI No obvious CN deficits Coloring WNL Pt is able to speak clearly, coherently without shortness of breath or increased work of breathing.  + audible congestion Thought process is linear.  Mood is appropriate.   Assessment and Plan:   Influenza- new.  Will send Tamiflu for pt as sxs just worsened yesterday evening.  Reviewed supportive care- fluids, rest, tylenol/ibuprofen, cough medications- and red flags that should prompt evaluation.  Pt expressed understanding and is in agreement w/ plan.   Chronic joint pain- pt needs his handicapped  placard renewed.  Will complete form and place up front for pt to pick up when he is feeling better.   Annye Asa, MD 11/30/2020

## 2020-12-07 ENCOUNTER — Other Ambulatory Visit: Payer: Self-pay | Admitting: Family Medicine

## 2020-12-07 DIAGNOSIS — F4312 Post-traumatic stress disorder, chronic: Secondary | ICD-10-CM | POA: Diagnosis not present

## 2020-12-07 DIAGNOSIS — F411 Generalized anxiety disorder: Secondary | ICD-10-CM | POA: Diagnosis not present

## 2020-12-07 DIAGNOSIS — F5101 Primary insomnia: Secondary | ICD-10-CM | POA: Diagnosis not present

## 2020-12-13 ENCOUNTER — Telehealth: Payer: Self-pay | Admitting: Family Medicine

## 2020-12-13 DIAGNOSIS — G4733 Obstructive sleep apnea (adult) (pediatric): Secondary | ICD-10-CM | POA: Diagnosis not present

## 2020-12-13 NOTE — Telephone Encounter (Signed)
Pt called in asking for an update on the handicap forms, please advise pt was seen on 11/30/20

## 2020-12-14 NOTE — Telephone Encounter (Signed)
Lvm for patient letting him know the form would be ready for him today and placed up front

## 2020-12-15 ENCOUNTER — Ambulatory Visit (INDEPENDENT_AMBULATORY_CARE_PROVIDER_SITE_OTHER): Payer: BC Managed Care – PPO | Admitting: Internal Medicine

## 2020-12-15 ENCOUNTER — Other Ambulatory Visit: Payer: Self-pay

## 2020-12-15 ENCOUNTER — Encounter: Payer: Self-pay | Admitting: Internal Medicine

## 2020-12-15 VITALS — BP 122/70 | HR 50 | Ht 66.0 in | Wt 326.0 lb

## 2020-12-15 DIAGNOSIS — E1165 Type 2 diabetes mellitus with hyperglycemia: Secondary | ICD-10-CM

## 2020-12-15 DIAGNOSIS — F411 Generalized anxiety disorder: Secondary | ICD-10-CM | POA: Diagnosis not present

## 2020-12-15 DIAGNOSIS — E1142 Type 2 diabetes mellitus with diabetic polyneuropathy: Secondary | ICD-10-CM | POA: Diagnosis not present

## 2020-12-15 DIAGNOSIS — F5101 Primary insomnia: Secondary | ICD-10-CM | POA: Diagnosis not present

## 2020-12-15 DIAGNOSIS — E291 Testicular hypofunction: Secondary | ICD-10-CM | POA: Diagnosis not present

## 2020-12-15 DIAGNOSIS — F4312 Post-traumatic stress disorder, chronic: Secondary | ICD-10-CM | POA: Diagnosis not present

## 2020-12-15 LAB — GLUCOSE, POCT (MANUAL RESULT ENTRY): POC Glucose: 105 mg/dl — AB (ref 70–99)

## 2020-12-15 LAB — POCT GLYCOSYLATED HEMOGLOBIN (HGB A1C): Hemoglobin A1C: 6.5 % — AB (ref 4.0–5.6)

## 2020-12-15 NOTE — Progress Notes (Signed)
Name: Caleb White.  Age/ Sex: 55 y.o., male   MRN/ DOB: [id number], [birth year]     PCP: [name], MD   Reason for Endocrinology Evaluation: Type 2 Diabetes Mellitus  Initial Endocrine Consultative Visit: 05/14/2020    PATIENT IDENTIFIER: [name]. is a 55 y.o. male with a past medical history of T2DM, HTN and Dyslipidemia. The patient has followed with Endocrinology clinic since 05/14/2020 for consultative assistance with management of his diabetes.  DIABETIC HISTORY:  Caleb White was diagnosed with DM in 2014, has been on glipizide and farxiga, no prior insulin use. His hemoglobin A1c has ranged from 6.3% in 2018, peaking at 9.9% in 2022   On his initial visit to our clinic his A1c 9.9% , we continued Glipizide and farxiga and started Rybelsus   HYPOGONADISM HISTORY:  Has history of hypogonadism, he was on testosterone replacement years ago but restarted by his PCP in 10/2019 with a testosterone level  of 135 ng/dLcurrently on testosterone cypionate , wife injects testosterone  1 mL every 2 weeks   History of non-compliance in the past    Per pt insurance will not cover weight loss surgery    Has depression and he is an emotional eating , on Prozac started therapy 2022   He is on chronic hydrocodone for severe pain  , he is also on Alprazolam  A few times a week.    SUBJECTIVE:   During the last visit (7/29/222): A1c 6.9% , increased Rybelsus, continued  [place] and Glipizide     Today (12/15/2020): Caleb White is here for a follow up on diabetes management .He checks his blood sugars occasionally . The patient has not had hypoglycemic episodes.   Wife diagnosis with a new disease and possibly fatal  Denies nausea or diarrhea or diarrhea   Dexcom is not covered by insurance, currently using Freestyle libre  Continued with Therapy for eating disorder     Last testosterone injection was this past weekend     HOME ENDOCRINE   REGIMEN:  Glipizide 10 mg, 1 tablet Before Breakfast and Supper Rybelsus 7 mg, 1 tablet before breakfast Farxiga 10 mg, 1 tablet before breakfast  Testosterone 100 mg Q 14 days     Statin: yes ACE-I/ARB: yes Prior Diabetic Education: yes   CONTINUOUS GLUCOSE MONITORING RECORD INTERPRETATION: Unable to download      DIABETIC COMPLICATIONS: Microvascular complications:   Denies: CKD, neuropathy, retinopathy Last Eye Exam: Completed 09/07/2020  Macrovascular complications:   Denies: CAD, CVA, PVD   HISTORY:  Past Medical History:  Past Medical History:  Diagnosis Date   Arthritis    per patient   Back injury    per patient, history of ruptured or torn disk    Depression    Diabetes mellitus without complication (HCC)    Foot deformity    Gluten intolerance    self diagnosed, no h/o biopsy or blood testing   Hyperlipidemia    Low testosterone    Morbid obesity with BMI of 50.0-59.9, adult (HCC)    OSA on CPAP    Peptic ulcer    Shortness of breath    Sleep apnea    on CPAP   Spinal stenosis    Past Surgical History:  Past Surgical History:  Procedure Laterality Date   BLADDER SURGERY     at 55 years old, patient unaware of details   COLONOSCOPY  2000?   COLONOSCOPY WITH PROPOFOL N/A 04/22/2019  Procedure: COLONOSCOPY WITH PROPOFOL;  Surgeon: Gatha Mayer, MD;  Location: [facility];  Service: Endoscopy;  Laterality: N/A;   ESOPHAGOGASTRODUODENOSCOPY     POLYPECTOMY  04/22/2019   Procedure: POLYPECTOMY;  Surgeon: Gatha Mayer, MD;  Location: [facility];  Service: Endoscopy;;   VASECTOMY  2019   Social History:  reports that he quit smoking about 8 years ago. His smoking use included cigarettes. He has a 7.00 pack-year smoking history. He has never used smokeless tobacco. He reports current alcohol use. He reports that he does not use drugs. Family History:  Family History  Problem Relation Age of Onset   Heart attack Mother    Heart failure Mother     Diabetes Mother    Heart attack Father    Hypertension Father    Colon polyps Father    Heart disease Brother    Cancer Brother    Stroke Brother    Bladder Cancer Brother    Diabetes Brother    Clotting disorder Paternal Aunt        Blood clot after minor surgery   Breast cancer Paternal Aunt    Clotting disorder Paternal Grandmother        Blood clot after minor surgery   Clotting disorder Maternal Grandmother    Cancer Maternal Grandmother    Diabetes Maternal Grandfather    Alcohol abuse Paternal Grandfather    Colon cancer Neg Hx    Esophageal cancer Neg Hx    Rectal cancer Neg Hx      HOME MEDICATIONS: Allergies as of 12/15/2020       Reactions   Penicillins Anaphylaxis   Did it involve swelling of the face/tongue/throat, SOB, or low BP? No Did it involve sudden or severe rash/hives, skin peeling, or any reaction on the inside of your mouth or nose? no Did you need to seek medical attention at a hospital or doctor's office? No When did it last happen?      Middle school If all above answers are "NO", may proceed with cephalosporin use.   Ambien [zolpidem Tartrate] Other (See Comments)   nightmares   Cetirizine Other (See Comments)   Makes feel "drunk"   Gluten Meal Other (See Comments)   Diarrhea and headache   Lidocaine Swelling, Other (See Comments)   Redness that spreads.  PATIENT CAN USE PRESERVATIVE FREE LIDOCAINE Non preservative there is no problem   Pseudoephedrine Hcl Other (See Comments)   Makes feel "drunk"   Zolpidem Other (See Comments)   nightmares   Latex Itching, Swelling, Rash        Medication List        Accurate as of December 15, 2020 11:09 AM. If you have any questions, ask your nurse or doctor.          ALPRAZolam 0.5 MG tablet Commonly known as: XANAX TAKE 1 TABLET BY MOUTH TWICE A DAY AS NEEDED FOR ANXIETY   atorvastatin 10 MG tablet Commonly known as: LIPITOR TAKE 1 TABLET BY MOUTH EVERY DAY   buPROPion 150 MG  24 hr tablet Commonly known as: WELLBUTRIN XL TAKE 1 TABLET BY MOUTH EVERY DAY   Farxiga 10 MG Tabs tablet Generic drug: dapagliflozin propanediol TAKE 1 TABLET BY MOUTH EVERY DAY   fenofibrate 160 MG tablet TAKE 1 TABLET BY MOUTH EVERY DAY   FLUoxetine 40 MG capsule Commonly known as: PROZAC TAKE 1 CAPSULE (40 MG TOTAL) BY MOUTH DAILY.   fluticasone 50 MCG/ACT nasal spray Commonly known as:  FLONASE Place 1 spray into both nostrils daily as needed for allergies or rhinitis.   FreeStyle Libre 2 Sensor Misc 1 Device by Does not apply route every 14 (fourteen) days.   glipiZIDE 10 MG tablet Commonly known as: GLUCOTROL TAKE 1 TABLET (10 MG TOTAL) BY MOUTH 2 (TWO) TIMES DAILY BEFORE A MEAL.   HYDROcodone-acetaminophen 7.5-325 MG tablet Commonly known as: NORCO Take 1 tablet by mouth every 6 (six) hours as needed for moderate pain.   lisinopril 2.5 MG tablet Commonly known as: ZESTRIL TAKE 1 TABLET BY MOUTH EVERY DAY AS DIRECTED   loratadine 10 MG tablet Commonly known as: CLARITIN Take 10 mg by mouth daily.   meloxicam 15 MG tablet Commonly known as: MOBIC TAKE 1 TABLET BY MOUTH EVERY DAY   nitroGLYCERIN 0.4 MG SL tablet Commonly known as: NITROSTAT PLACE 1 TABLET UNDER THE TONGUE EVERY 5 MINUTES AS NEEDED FOR CHEST PAIN.   oseltamivir 75 MG capsule Commonly known as: Tamiflu Take 1 capsule (75 mg total) by mouth 2 (two) times daily.   Rybelsus 7 MG Tabs Generic drug: Semaglutide Take 7 mg by mouth daily.   tamsulosin 0.4 MG Caps capsule Commonly known as: FLOMAX TAKE 1 CAPSULE BY MOUTH EVERY DAY   testosterone cypionate 100 MG/ML injection Commonly known as: DEPOTESTOTERONE CYPIONATE INJECT 1 ML (100 MG TOTAL) INTO THE MUSCLE EVERY 14 (FOURTEEN) DAYS. FOR IM USE ONLY   tiZANidine 4 MG tablet Commonly known as: ZANAFLEX TAKE 1 TABLET EVERY 6 HOURS AS NEEDED FOR MUSCLE SPASMS   traZODone 100 MG tablet Commonly known as: DESYREL TAKE 1 TABLET BY MOUTH  EVERYDAY AT BEDTIME What changed: See the new instructions.         OBJECTIVE:   Vital Signs: BP 122/70 (BP Location: Left Arm, Patient Position: Sitting, Cuff Size: Large)   Pulse (!) 50   Ht 5\' 6"  (1.676 m)   Wt (!) 326 lb (147.9 kg)   SpO2 99%   BMI 52.62 kg/m   Wt Readings from Last 3 Encounters:  12/15/20 (!) 326 lb (147.9 kg)  08/13/20 (!) 338 lb 8 oz (153.5 kg)  05/14/20 (!) 346 lb 8 oz (157.2 kg)     Exam: General: Pt appears well and is in NAD  Lungs: Clear with good BS bilat with no rales, rhonchi, or wheezes  Heart: RRR with normal S1 and S2 and no gallops; no murmurs; no rub  Abdomen: Normoactive bowel sounds, soft, nontender, without masses or organomegaly palpable  Extremities: Trace  pretibial edema  Neuro: MS is good with appropriate affect, pt is alert and Ox3   DM Foot Exam 12/15/2020 The skin of the feet is intact without sores or ulcerations. The pedal pulses are 1+ on right and 1+ on left. The sensation is decreased  to a screening 5.07, 10 gram monofilament bilaterally   DATA REVIEWED:  Lab Results  Component Value Date   HGBA1C 6.5 (A) 12/15/2020   HGBA1C 6.9 (A) 08/13/2020   HGBA1C 9.9 (H) 05/05/2020   Lab Results  Component Value Date   MICROALBUR <0.7 08/13/2020   LDLCALC 42 05/05/2020   CREATININE 0.80 05/05/2020      Lab Results  Component Value Date   CHOL 118 05/05/2020   HDL 47.20 05/05/2020   LDLCALC 42 05/05/2020   LDLDIRECT 81.0 04/11/2017   TRIG 148.0 05/05/2020   CHOLHDL 3 05/05/2020       Results for STUART, MIRABILE "[place]" (MRN [id number]) as of 08/16/2020 12:07  Ref. Range 08/13/2020 10:58  MICROALB/CREAT RATIO Latest Ref Range: 0.0 - 30.0 mg/g 0.5  LH Latest Ref Range: 1.50 - 9.30 mIU/mL 1.33 (L)  Prolactin Latest Ref Range: 2.0 - 18.0 ng/mL 11.4  Testosterone Latest Ref Range: 300.00 - 890.00 ng/dL 280.00 (L)  PSA Latest Ref Range: 0.10 - 4.00 ng/mL 0.08 (L)  Creatinine,U Latest Units: mg/dL 136.7   Microalb, Ur Latest Ref Range: 0.0 - 1.9 mg/dL <0.7    ASSESSMENT / PLAN / RECOMMENDATIONS:   1) Type 2 Diabetes Mellitus, Optimally controlled, With  neuropathic  complications - Most recent A1c of 6.5 %. Goal A1c < 7.0 %.    - I have praised the pt on improved glycemic control  - Doing well with freestyle libre but we were unable to download as he did not bring it today  - NO changes  MEDICATIONS: Continue  Glipizide 10 mg, 1 tablet Before Breakfast and Supper   Continue  Rybelsus 7 mg, 1 tablet before breakfast Continue Farxiga 10 mg, 1 tablet before breakfast  EDUCATION / INSTRUCTIONS: BG monitoring instructions: Patient is instructed to check his blood sugars 1 times a day, fasting . Call [facility] if: BG persistently < 70  I reviewed the Rule of 15 for the treatment of hypoglycemia in detail with the patient. Literature supplied.     2) Diabetic complications:  Eye: Does not have known diabetic retinopathy.  Neuro/ Feet: Does have known diabetic peripheral neuropathy based on foot exam 11/2020 Renal: Patient does not have known baseline CKD. He   is  on an ACEI/ARB at present.    3) Hypogonadism:   - He is compliant with testosterone intake -Will have labs done next week, we discussed the importance of checking testosterone level in between injections   Continue testosterone cypionate 1 mL (100 mg) Q 14 days   F/U in 4 months     Signed electronically by: Mack Guise, MD  [facility]  [address], [place] [address] Phone: [phone] FAX: [phone]   CC: [name], MD [address] [place] [address] Phone: [phone]  Fax: [phone]  Return to Endocrinology clinic as below: No future appointments.

## 2020-12-15 NOTE — Patient Instructions (Addendum)
-   Continue  Glipizide 10 mg, 1 tablet Before Breakfast and Supper ( preferably 20-30 minutes before the meal) - Continue  Rybelsus 7 mg, 1 tablet before breakfast ( preferably 30 minutes before a meal) - Continue Farxiga to 10 mg, 1 tablet before breakfast      HOW TO TREAT LOW BLOOD SUGARS (Blood sugar LESS THAN 70 MG/DL) Please follow the RULE OF 15 for the treatment of hypoglycemia treatment (when your (blood sugars are less than 70 mg/dL)   STEP 1: Take 15 grams of carbohydrates when your blood sugar is low, which includes:  3-4 GLUCOSE TABS  OR 3-4 OZ OF JUICE OR REGULAR SODA OR ONE TUBE OF GLUCOSE GEL    STEP 2: RECHECK blood sugar in 15 MINUTES STEP 3: If your blood sugar is still low at the 15 minute recheck --> then, go back to STEP 1 and treat AGAIN with another 15 grams of carbohydrates.

## 2020-12-20 DIAGNOSIS — F4312 Post-traumatic stress disorder, chronic: Secondary | ICD-10-CM | POA: Diagnosis not present

## 2020-12-20 DIAGNOSIS — F5101 Primary insomnia: Secondary | ICD-10-CM | POA: Diagnosis not present

## 2020-12-20 DIAGNOSIS — F411 Generalized anxiety disorder: Secondary | ICD-10-CM | POA: Diagnosis not present

## 2020-12-21 ENCOUNTER — Encounter: Payer: Self-pay | Admitting: Family Medicine

## 2020-12-21 MED ORDER — HYDROCODONE-ACETAMINOPHEN 7.5-325 MG PO TABS: 1.0000 | ORAL_TABLET | Freq: Four times a day (QID) | ORAL | 0 refills | Status: DC | PRN

## 2020-12-22 ENCOUNTER — Other Ambulatory Visit: Payer: Self-pay | Admitting: Family Medicine

## 2020-12-25 ENCOUNTER — Other Ambulatory Visit: Payer: Self-pay | Admitting: Family

## 2020-12-26 ENCOUNTER — Other Ambulatory Visit: Payer: Self-pay | Admitting: Family

## 2021-01-03 DIAGNOSIS — F5101 Primary insomnia: Secondary | ICD-10-CM | POA: Diagnosis not present

## 2021-01-03 DIAGNOSIS — F4312 Post-traumatic stress disorder, chronic: Secondary | ICD-10-CM | POA: Diagnosis not present

## 2021-01-03 DIAGNOSIS — F411 Generalized anxiety disorder: Secondary | ICD-10-CM | POA: Diagnosis not present

## 2021-01-11 DIAGNOSIS — F411 Generalized anxiety disorder: Secondary | ICD-10-CM | POA: Diagnosis not present

## 2021-01-11 DIAGNOSIS — F5101 Primary insomnia: Secondary | ICD-10-CM | POA: Diagnosis not present

## 2021-01-11 DIAGNOSIS — F4312 Post-traumatic stress disorder, chronic: Secondary | ICD-10-CM | POA: Diagnosis not present

## 2021-01-12 DIAGNOSIS — G4733 Obstructive sleep apnea (adult) (pediatric): Secondary | ICD-10-CM | POA: Diagnosis not present

## 2021-01-14 ENCOUNTER — Other Ambulatory Visit: Payer: Self-pay | Admitting: Family Medicine

## 2021-01-14 NOTE — Telephone Encounter (Signed)
Patient is requesting a refill of the following medications: Requested Prescriptions   Pending Prescriptions Disp Refills   tiZANidine (ZANAFLEX) 4 MG tablet [Pharmacy Med Name: TIZANIDINE HCL 4 MG TABLET] 45 tablet 0    Sig: TAKE 1 TABLET EVERY 6 HOURS AS NEEDED FOR MUSCLE SPASMS    Date of patient request: 01/14/21 Last office visit: 11/30/20 Date of last refill: 10/14/20 Last refill amount: 45

## 2021-01-20 DIAGNOSIS — F5101 Primary insomnia: Secondary | ICD-10-CM | POA: Diagnosis not present

## 2021-01-20 DIAGNOSIS — F4312 Post-traumatic stress disorder, chronic: Secondary | ICD-10-CM | POA: Diagnosis not present

## 2021-01-20 DIAGNOSIS — F411 Generalized anxiety disorder: Secondary | ICD-10-CM | POA: Diagnosis not present

## 2021-01-26 ENCOUNTER — Encounter: Payer: Self-pay | Admitting: Family Medicine

## 2021-01-28 MED ORDER — HYDROCODONE-ACETAMINOPHEN 7.5-325 MG PO TABS: 1.0000 | ORAL_TABLET | Freq: Four times a day (QID) | ORAL | 0 refills | Status: DC | PRN

## 2021-01-30 ENCOUNTER — Other Ambulatory Visit: Payer: Self-pay | Admitting: Family Medicine

## 2021-02-03 DIAGNOSIS — F411 Generalized anxiety disorder: Secondary | ICD-10-CM | POA: Diagnosis not present

## 2021-02-03 DIAGNOSIS — F4312 Post-traumatic stress disorder, chronic: Secondary | ICD-10-CM | POA: Diagnosis not present

## 2021-02-03 DIAGNOSIS — F5101 Primary insomnia: Secondary | ICD-10-CM | POA: Diagnosis not present

## 2021-02-10 DIAGNOSIS — F411 Generalized anxiety disorder: Secondary | ICD-10-CM | POA: Diagnosis not present

## 2021-02-10 DIAGNOSIS — F4312 Post-traumatic stress disorder, chronic: Secondary | ICD-10-CM | POA: Diagnosis not present

## 2021-02-10 DIAGNOSIS — F5101 Primary insomnia: Secondary | ICD-10-CM | POA: Diagnosis not present

## 2021-02-14 ENCOUNTER — Other Ambulatory Visit: Payer: Self-pay | Admitting: Family Medicine

## 2021-02-16 ENCOUNTER — Other Ambulatory Visit: Payer: Self-pay | Admitting: Internal Medicine

## 2021-02-25 ENCOUNTER — Other Ambulatory Visit: Payer: Self-pay | Admitting: Family Medicine

## 2021-03-01 ENCOUNTER — Encounter: Payer: Self-pay | Admitting: Family Medicine

## 2021-03-01 ENCOUNTER — Other Ambulatory Visit: Payer: Self-pay | Admitting: Family Medicine

## 2021-03-02 MED ORDER — HYDROCODONE-ACETAMINOPHEN 7.5-325 MG PO TABS: 1.0000 | ORAL_TABLET | Freq: Four times a day (QID) | ORAL | 0 refills | Status: DC | PRN

## 2021-03-03 DIAGNOSIS — F411 Generalized anxiety disorder: Secondary | ICD-10-CM | POA: Diagnosis not present

## 2021-03-03 DIAGNOSIS — F5101 Primary insomnia: Secondary | ICD-10-CM | POA: Diagnosis not present

## 2021-03-03 DIAGNOSIS — F4312 Post-traumatic stress disorder, chronic: Secondary | ICD-10-CM | POA: Diagnosis not present

## 2021-03-08 DIAGNOSIS — F5101 Primary insomnia: Secondary | ICD-10-CM | POA: Diagnosis not present

## 2021-03-08 DIAGNOSIS — F411 Generalized anxiety disorder: Secondary | ICD-10-CM | POA: Diagnosis not present

## 2021-03-08 DIAGNOSIS — F4312 Post-traumatic stress disorder, chronic: Secondary | ICD-10-CM | POA: Diagnosis not present

## 2021-03-14 ENCOUNTER — Encounter: Payer: Self-pay | Admitting: Family Medicine

## 2021-03-14 ENCOUNTER — Ambulatory Visit (INDEPENDENT_AMBULATORY_CARE_PROVIDER_SITE_OTHER): Payer: BC Managed Care – PPO | Admitting: Family Medicine

## 2021-03-14 VITALS — BP 112/70 | HR 65 | Temp 97.6°F | Resp 16 | Wt 321.2 lb

## 2021-03-14 DIAGNOSIS — Z23 Encounter for immunization: Secondary | ICD-10-CM | POA: Diagnosis not present

## 2021-03-14 DIAGNOSIS — E785 Hyperlipidemia, unspecified: Secondary | ICD-10-CM | POA: Diagnosis not present

## 2021-03-14 DIAGNOSIS — F331 Major depressive disorder, recurrent, moderate: Secondary | ICD-10-CM

## 2021-03-14 DIAGNOSIS — Z6841 Body Mass Index (BMI) 40.0 and over, adult: Secondary | ICD-10-CM

## 2021-03-14 DIAGNOSIS — E1169 Type 2 diabetes mellitus with other specified complication: Secondary | ICD-10-CM | POA: Diagnosis not present

## 2021-03-14 LAB — CBC WITH DIFFERENTIAL/PLATELET
Basophils Absolute: 0 10*3/uL (ref 0.0–0.1)
Basophils Relative: 0.3 % (ref 0.0–3.0)
Eosinophils Absolute: 0.1 10*3/uL (ref 0.0–0.7)
Eosinophils Relative: 2.4 % (ref 0.0–5.0)
HCT: 44.9 % (ref 39.0–52.0)
Hemoglobin: 15.1 g/dL (ref 13.0–17.0)
Lymphocytes Relative: 33 % (ref 12.0–46.0)
Lymphs Abs: 1.8 10*3/uL (ref 0.7–4.0)
MCHC: 33.6 g/dL (ref 30.0–36.0)
MCV: 93.1 fl (ref 78.0–100.0)
Monocytes Absolute: 0.6 10*3/uL (ref 0.1–1.0)
Monocytes Relative: 10 % (ref 3.0–12.0)
Neutro Abs: 3 10*3/uL (ref 1.4–7.7)
Neutrophils Relative %: 54.3 % (ref 43.0–77.0)
Platelets: 215 10*3/uL (ref 150.0–400.0)
RBC: 4.83 Mil/uL (ref 4.22–5.81)
RDW: 12.8 % (ref 11.5–15.5)
WBC: 5.5 10*3/uL (ref 4.0–10.5)

## 2021-03-14 LAB — HEPATIC FUNCTION PANEL
ALT: 15 U/L (ref 0–53)
AST: 15 U/L (ref 0–37)
Albumin: 4.3 g/dL (ref 3.5–5.2)
Alkaline Phosphatase: 37 U/L — ABNORMAL LOW (ref 39–117)
Bilirubin, Direct: 0.1 mg/dL (ref 0.0–0.3)
Total Bilirubin: 0.4 mg/dL (ref 0.2–1.2)
Total Protein: 6.7 g/dL (ref 6.0–8.3)

## 2021-03-14 LAB — BASIC METABOLIC PANEL
BUN: 17 mg/dL (ref 6–23)
CO2: 27 mEq/L (ref 19–32)
Calcium: 9.5 mg/dL (ref 8.4–10.5)
Chloride: 106 mEq/L (ref 96–112)
Creatinine, Ser: 0.95 mg/dL (ref 0.40–1.50)
GFR: 91.09 mL/min (ref >60.00)
Glucose, Bld: 121 mg/dL — ABNORMAL HIGH (ref 70–99)
Potassium: 4.1 mEq/L (ref 3.5–5.1)
Sodium: 138 mEq/L (ref 135–145)

## 2021-03-14 LAB — LIPID PANEL
Cholesterol: 149 mg/dL (ref 0–200)
HDL: 44.4 mg/dL (ref >39.00)
LDL Cholesterol: 86 mg/dL (ref 0–99)
NonHDL: 104.8
Total CHOL/HDL Ratio: 3
Triglycerides: 94 mg/dL (ref 0.0–149.0)
VLDL: 18.8 mg/dL (ref 0.0–40.0)

## 2021-03-14 NOTE — Progress Notes (Signed)
° °  Subjective:    Patient ID: Caleb Nissen., male    DOB: 1965/06/05, 56 y.o.   MRN: [id number]  HPI Hyperlipidemia- chronic problem, on Lipitor 10mg  daily and Fenofibrate 160mg  daily.  Denies CP, SOB, abd pain, N/V.  Obesity- pt's BMI is 51.84.  He is down 5 lbs since last visit in November.  Pt reports he actually gained weight over the holidays and lost more than 5.  Depression- chronic problem, on Wellbutrin 150mg  daily, Prozac 40mg , and Alprazolam 0.5mg  daily.  Pt reports mood is better than previous.    Review of Systems For ROS see HPI   This visit occurred during the SARS-CoV-2 public health emergency.  Safety protocols were in place, including screening questions prior to the visit, additional usage of staff PPE, and extensive cleaning of exam room while observing appropriate contact time as indicated for disinfecting solutions.      Objective:   Physical Exam Vitals reviewed.  Constitutional:      General: He is not in acute distress.    Appearance: Normal appearance. He is well-developed. He is obese. He is not ill-appearing.  HENT:     Head: Normocephalic and atraumatic.  Eyes:     Extraocular Movements: Extraocular movements intact.     Conjunctiva/sclera: Conjunctivae normal.     Pupils: Pupils are equal, round, and reactive to light.  Neck:     Thyroid: No thyromegaly.  Cardiovascular:     Rate and Rhythm: Normal rate and regular rhythm.     Pulses: Normal pulses.     Heart sounds: Normal heart sounds. No murmur heard. Pulmonary:     Effort: Pulmonary effort is normal. No respiratory distress.     Breath sounds: Normal breath sounds.  Abdominal:     General: Bowel sounds are normal. There is no distension.     Palpations: Abdomen is soft.  Musculoskeletal:     Cervical back: Normal range of motion and neck supple.     Right lower leg: No edema.     Left lower leg: No edema.  Lymphadenopathy:     Cervical: No cervical adenopathy.  Skin:     General: Skin is warm and dry.  Neurological:     General: No focal deficit present.     Mental Status: He is alert and oriented to person, place, and time.     Cranial Nerves: No cranial nerve deficit.  Psychiatric:        Mood and Affect: Mood normal.        Behavior: Behavior normal.          Assessment & Plan:

## 2021-03-14 NOTE — Patient Instructions (Addendum)
Cancel your March appt and schedule your complete physical in 6 months We'll notify you of your lab results and make any changes if needed Continue to work on healthy diet and regular exercise- you're doing great!!! Please have eye doctor send a copy of their report Call with any questions or concerns Stay Safe!  Stay Healthy! Happy Early Rudene Anda!!!

## 2021-03-14 NOTE — Assessment & Plan Note (Signed)
Chronic problem.  Pt feels mood is currently well controlled on Wellbutrin and Prozac.  Uses Alprazolam as needed.  States he is feeling better than before.  No med changes at this time

## 2021-03-14 NOTE — Assessment & Plan Note (Signed)
Chronic problem.  Currently on Lipitor 10mg  nightly and Fenofibrate 160mg  w/o difficulty.  He is working on weight loss and is down 5 lbs since November.  Applauded his efforts.  Check labs.  Adjust meds prn

## 2021-03-14 NOTE — Assessment & Plan Note (Signed)
Pt's BMI is 51.84 but he is down 5 lbs.  Applauded his efforts and encouraged him to continue.  Will continue to follow at future visits.

## 2021-03-15 LAB — TSH: TSH: 2.85 u[IU]/mL (ref 0.35–5.50)

## 2021-03-16 ENCOUNTER — Ambulatory Visit: Payer: BC Managed Care – PPO | Admitting: Internal Medicine

## 2021-03-18 ENCOUNTER — Telehealth: Payer: Self-pay

## 2021-03-18 NOTE — Telephone Encounter (Signed)
-----   Message from [name], MD sent at 03/15/2021 12:24 PM EST ----- ?Labs look great!  No changes at this time ?

## 2021-03-18 NOTE — Telephone Encounter (Signed)
Patient aware of labs.  

## 2021-03-20 ENCOUNTER — Other Ambulatory Visit: Payer: Self-pay | Admitting: Family Medicine

## 2021-03-21 DIAGNOSIS — F5101 Primary insomnia: Secondary | ICD-10-CM | POA: Diagnosis not present

## 2021-03-21 DIAGNOSIS — F411 Generalized anxiety disorder: Secondary | ICD-10-CM | POA: Diagnosis not present

## 2021-03-21 DIAGNOSIS — F4312 Post-traumatic stress disorder, chronic: Secondary | ICD-10-CM | POA: Diagnosis not present

## 2021-03-22 DIAGNOSIS — G4733 Obstructive sleep apnea (adult) (pediatric): Secondary | ICD-10-CM | POA: Diagnosis not present

## 2021-03-28 ENCOUNTER — Encounter: Payer: Self-pay | Admitting: Family Medicine

## 2021-03-29 MED ORDER — HYDROCODONE-ACETAMINOPHEN 7.5-325 MG PO TABS: 1.0000 | ORAL_TABLET | Freq: Four times a day (QID) | ORAL | 0 refills | Status: DC | PRN

## 2021-03-31 DIAGNOSIS — F411 Generalized anxiety disorder: Secondary | ICD-10-CM | POA: Diagnosis not present

## 2021-03-31 DIAGNOSIS — F4312 Post-traumatic stress disorder, chronic: Secondary | ICD-10-CM | POA: Diagnosis not present

## 2021-03-31 DIAGNOSIS — F5101 Primary insomnia: Secondary | ICD-10-CM | POA: Diagnosis not present

## 2021-04-04 DIAGNOSIS — F411 Generalized anxiety disorder: Secondary | ICD-10-CM | POA: Diagnosis not present

## 2021-04-04 DIAGNOSIS — F4312 Post-traumatic stress disorder, chronic: Secondary | ICD-10-CM | POA: Diagnosis not present

## 2021-04-04 DIAGNOSIS — F5101 Primary insomnia: Secondary | ICD-10-CM | POA: Diagnosis not present

## 2021-04-11 ENCOUNTER — Ambulatory Visit: Payer: BC Managed Care – PPO | Admitting: Family Medicine

## 2021-04-14 DIAGNOSIS — F411 Generalized anxiety disorder: Secondary | ICD-10-CM | POA: Diagnosis not present

## 2021-04-14 DIAGNOSIS — F5101 Primary insomnia: Secondary | ICD-10-CM | POA: Diagnosis not present

## 2021-04-14 DIAGNOSIS — F4312 Post-traumatic stress disorder, chronic: Secondary | ICD-10-CM | POA: Diagnosis not present

## 2021-04-22 DIAGNOSIS — G4733 Obstructive sleep apnea (adult) (pediatric): Secondary | ICD-10-CM | POA: Diagnosis not present

## 2021-04-29 ENCOUNTER — Encounter: Payer: Self-pay | Admitting: Family Medicine

## 2021-04-29 MED ORDER — HYDROCODONE-ACETAMINOPHEN 7.5-325 MG PO TABS: 1.0000 | ORAL_TABLET | Freq: Four times a day (QID) | ORAL | 0 refills | Status: DC | PRN

## 2021-04-29 NOTE — Telephone Encounter (Signed)
Patient is requesting a refill of the following medications: ?Requested Prescriptions  ? ?Pending Prescriptions Disp Refills  ? HYDROcodone-acetaminophen (NORCO) 7.5-325 MG tablet 120 tablet 0  ?  Sig: Take 1 tablet by mouth every 6 (six) hours as needed for moderate pain.  ? ? ?Date of patient request: 04/29/21 ?Last office visit:03/14/21 ?Date of last refill: 03/29/21 ?Last refill amount: 120 ? ?

## 2021-05-01 NOTE — Telephone Encounter (Signed)
Controlled substance database reviewed.  Last filled hydrocodone 7.5/325 on March 17, consistent refills from Dr. Birdie Riddle.  Previous prescription refill February 15.  No concerns.  Appointment with primary care provider in February.  It appears his prescription was refilled by my colleague on Friday. ?

## 2021-05-04 MED ORDER — HYDROCODONE-ACETAMINOPHEN 5-325 MG PO TABS: 1.5000 | ORAL_TABLET | Freq: Four times a day (QID) | ORAL | 0 refills | Status: DC | PRN

## 2021-05-04 NOTE — Addendum Note (Signed)
Addended by: [name] on: 05/04/2021 03:43 PM ? ? Modules accepted: Orders ? ?

## 2021-05-09 IMAGING — DX DG CHEST 1V PORT
1 series · 1 of 1 positions shown · non-contrast
Comparison: 09/03/2017

CLINICAL DATA: Dyspnea

EXAM:
PORTABLE CHEST 1 VIEW

[chest ap]
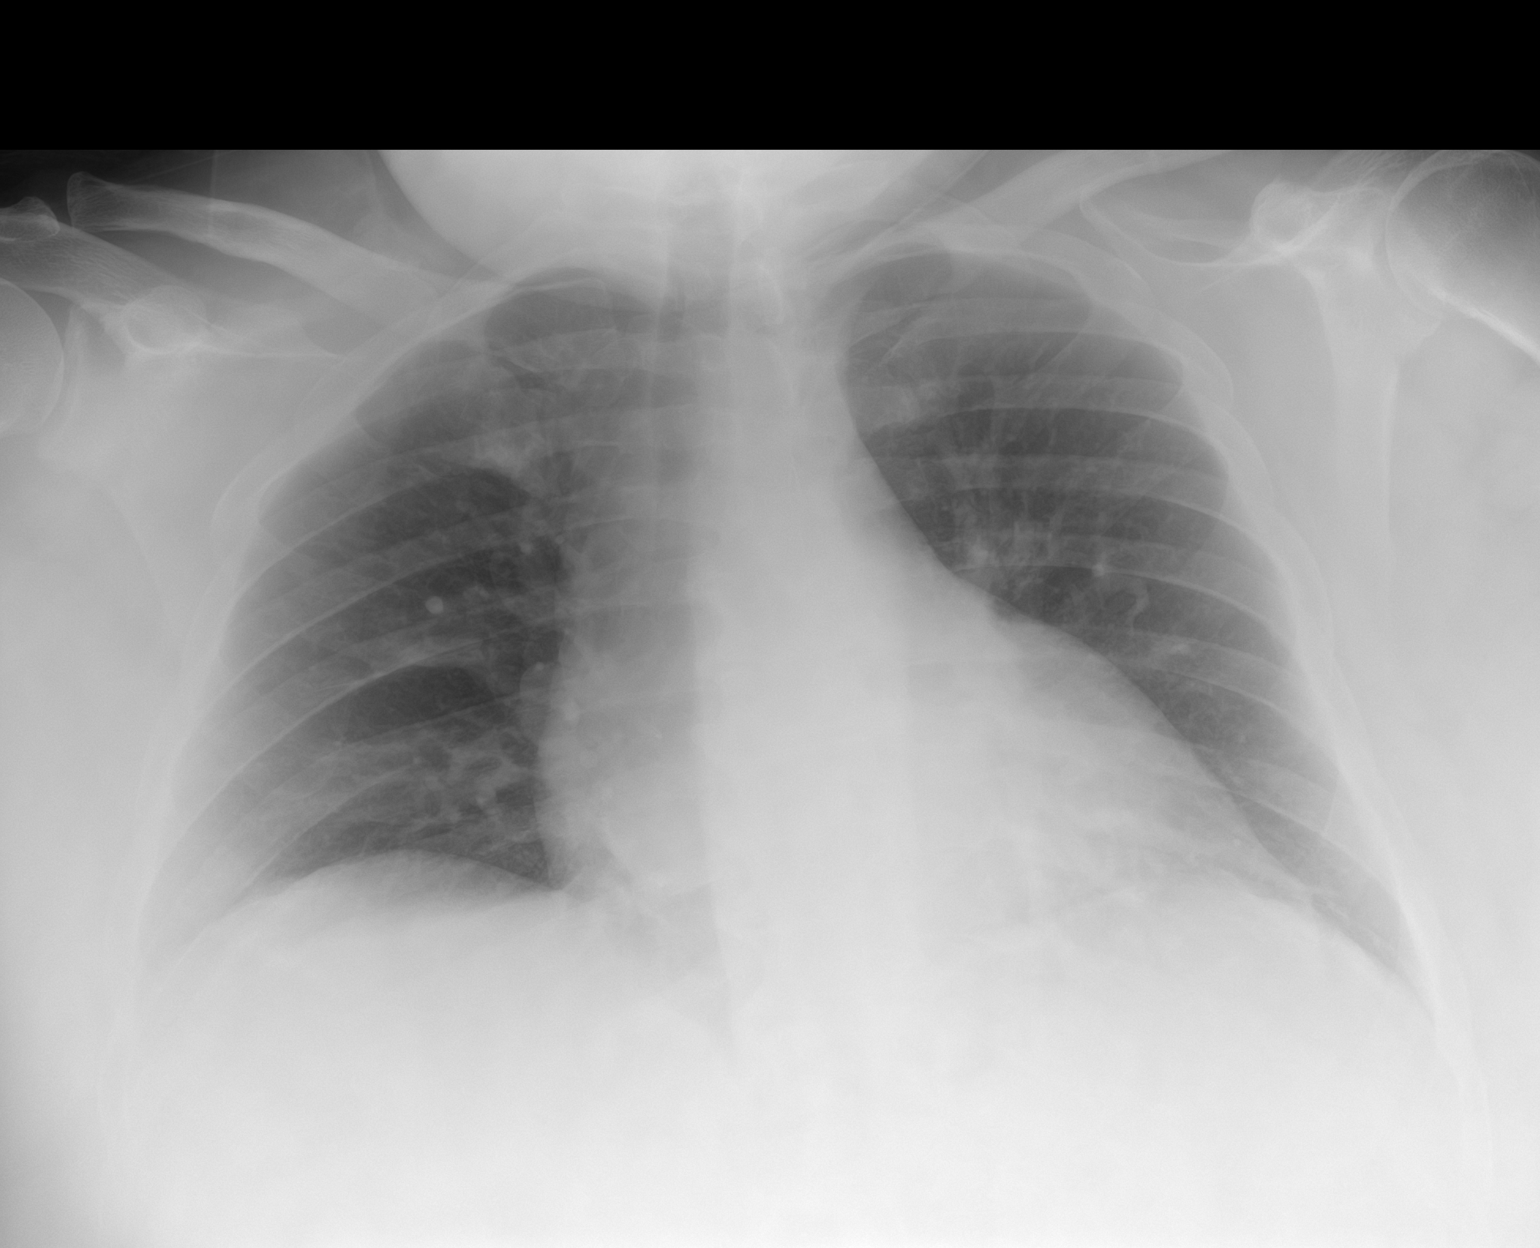

[1 of 1 positions shown; findings below may reference images not displayed]

FINDINGS: Borderline heart size accentuated by low volumes. There is no edema,
consolidation, effusion, or pneumothorax. No acute osseous finding.
IMPRESSION: Negative low volume chest.

## 2021-05-10 DIAGNOSIS — F4312 Post-traumatic stress disorder, chronic: Secondary | ICD-10-CM | POA: Diagnosis not present

## 2021-05-10 DIAGNOSIS — F411 Generalized anxiety disorder: Secondary | ICD-10-CM | POA: Diagnosis not present

## 2021-05-10 DIAGNOSIS — F5101 Primary insomnia: Secondary | ICD-10-CM | POA: Diagnosis not present

## 2021-05-22 DIAGNOSIS — G4733 Obstructive sleep apnea (adult) (pediatric): Secondary | ICD-10-CM | POA: Diagnosis not present

## 2021-05-24 DIAGNOSIS — F5101 Primary insomnia: Secondary | ICD-10-CM | POA: Diagnosis not present

## 2021-05-24 DIAGNOSIS — F411 Generalized anxiety disorder: Secondary | ICD-10-CM | POA: Diagnosis not present

## 2021-05-24 DIAGNOSIS — F4312 Post-traumatic stress disorder, chronic: Secondary | ICD-10-CM | POA: Diagnosis not present

## 2021-06-01 DIAGNOSIS — F5101 Primary insomnia: Secondary | ICD-10-CM | POA: Diagnosis not present

## 2021-06-01 DIAGNOSIS — F411 Generalized anxiety disorder: Secondary | ICD-10-CM | POA: Diagnosis not present

## 2021-06-01 DIAGNOSIS — F4312 Post-traumatic stress disorder, chronic: Secondary | ICD-10-CM | POA: Diagnosis not present

## 2021-06-02 ENCOUNTER — Other Ambulatory Visit: Payer: Self-pay | Admitting: Internal Medicine

## 2021-06-07 ENCOUNTER — Other Ambulatory Visit: Payer: Self-pay | Admitting: Family Medicine

## 2021-06-07 ENCOUNTER — Encounter: Payer: Self-pay | Admitting: Family Medicine

## 2021-06-08 MED ORDER — TESTOSTERONE CYPIONATE 100 MG/ML IM SOLN: INTRAMUSCULAR | 1 refills | Status: DC

## 2021-06-08 MED ORDER — HYDROCODONE-ACETAMINOPHEN 5-325 MG PO TABS: 1.5000 | ORAL_TABLET | Freq: Four times a day (QID) | ORAL | 0 refills | Status: DC | PRN

## 2021-06-08 NOTE — Telephone Encounter (Signed)
I re-sent this to the pharmacy.  Hopefully it goes through.

## 2021-06-08 NOTE — Addendum Note (Signed)
Addended by: [name] on: 06/08/2021 02:04 PM   Modules accepted: Orders

## 2021-06-15 ENCOUNTER — Ambulatory Visit: Payer: BC Managed Care – PPO | Admitting: Internal Medicine

## 2021-06-22 DIAGNOSIS — G4733 Obstructive sleep apnea (adult) (pediatric): Secondary | ICD-10-CM | POA: Diagnosis not present

## 2021-06-30 DIAGNOSIS — N401 Enlarged prostate with lower urinary tract symptoms: Secondary | ICD-10-CM | POA: Diagnosis not present

## 2021-06-30 DIAGNOSIS — R3912 Poor urinary stream: Secondary | ICD-10-CM | POA: Diagnosis not present

## 2021-06-30 DIAGNOSIS — N46023 Azoospermia due to obstruction of efferent ducts: Secondary | ICD-10-CM | POA: Diagnosis not present

## 2021-06-30 DIAGNOSIS — R3915 Urgency of urination: Secondary | ICD-10-CM | POA: Diagnosis not present

## 2021-07-04 ENCOUNTER — Other Ambulatory Visit: Payer: Self-pay | Admitting: Family Medicine

## 2021-07-05 DIAGNOSIS — F411 Generalized anxiety disorder: Secondary | ICD-10-CM | POA: Diagnosis not present

## 2021-07-05 DIAGNOSIS — F4312 Post-traumatic stress disorder, chronic: Secondary | ICD-10-CM | POA: Diagnosis not present

## 2021-07-05 DIAGNOSIS — F5101 Primary insomnia: Secondary | ICD-10-CM | POA: Diagnosis not present

## 2021-07-13 ENCOUNTER — Encounter: Payer: Self-pay | Admitting: Family Medicine

## 2021-07-13 ENCOUNTER — Encounter (INDEPENDENT_AMBULATORY_CARE_PROVIDER_SITE_OTHER): Payer: BC Managed Care – PPO | Admitting: Family Medicine

## 2021-07-13 DIAGNOSIS — F411 Generalized anxiety disorder: Secondary | ICD-10-CM | POA: Diagnosis not present

## 2021-07-13 DIAGNOSIS — F4312 Post-traumatic stress disorder, chronic: Secondary | ICD-10-CM | POA: Diagnosis not present

## 2021-07-13 DIAGNOSIS — T753XXA Motion sickness, initial encounter: Secondary | ICD-10-CM | POA: Diagnosis not present

## 2021-07-13 DIAGNOSIS — F5101 Primary insomnia: Secondary | ICD-10-CM | POA: Diagnosis not present

## 2021-07-13 MED ORDER — HYDROCODONE-ACETAMINOPHEN 7.5-325 MG PO TABS: 1.0000 | ORAL_TABLET | Freq: Four times a day (QID) | ORAL | 0 refills | Status: DC | PRN

## 2021-07-13 MED ORDER — SCOPOLAMINE 1 MG/3DAYS TD PT72: 1.0000 | MEDICATED_PATCH | TRANSDERMAL | 1 refills | Status: DC

## 2021-07-13 NOTE — Telephone Encounter (Signed)
[facility] VISIT   Patient agreed to [facility] visit and is aware that copayment and coinsurance may apply. Patient was treated using telemedicine according to accepted telemedicine protocols.  Subjective:   Patient complains of hx of seasickness and has upcoming cruise  Patient Active Problem List   Diagnosis Date Noted   Type 2 diabetes mellitus with diabetic polyneuropathy, without long-term current use of insulin ([place]) 12/15/2020   Hypogonadism in male 05/14/2020   Dyslipidemia 05/14/2020   Type 2 diabetes mellitus with hyperglycemia, without long-term current use of insulin ([place]) 05/14/2020   Benign neoplasm of sigmoid colon    Moderate episode of recurrent major depressive disorder ([place]) 05/13/2018   Insomnia 03/07/2017   Hyperlipidemia associated with type 2 diabetes mellitus ([place]) 12/13/2016   HTN (hypertension) 12/13/2016   Colon cancer screening 06/01/2016   BPH (benign prostatic hyperplasia) 09/24/2015   Low testosterone 09/24/2015   Chronic joint pain 09/24/2015   Diabetes mellitus, type 2 ([place]) 11/22/2012   Morbid obesity with BMI of 50.0-59.9, adult ([place]) 11/22/2012   Sleep apnea 11/22/2012   Social History   Tobacco Use   Smoking status: Former    Packs/day: 1.00    Years: 7.00    Total pack years: 7.00    Types: Cigarettes    Quit date: 09/22/2012    Years since quitting: 8.8   Smokeless tobacco: Never  Substance Use Topics   Alcohol use: Yes    Comment: Rarely    Current Outpatient Medications:    scopolamine (TRANSDERM-SCOP) 1 MG/3DAYS, Place 1 patch (1.5 mg total) onto the skin every 3 (three) days., Disp: 4 patch, Rfl: 1   ALPRAZolam (XANAX) 0.5 MG tablet, TAKE 1 TABLET BY MOUTH TWICE A DAY AS NEEDED FOR ANXIETY, Disp: 60 tablet, Rfl: 1   atorvastatin (LIPITOR) 10 MG tablet, TAKE 1 TABLET BY MOUTH EVERY DAY, Disp: 90 tablet, Rfl: 1   buPROPion (WELLBUTRIN XL) 150 MG 24 hr tablet, Take 1 tablet (150 mg total) by mouth daily. LAST REFILL W/O APPT, Disp: 30  tablet, Rfl: 0   Continuous Blood Gluc Sensor (FREESTYLE LIBRE 2 SENSOR) MISC, 1 Device by Does not apply route every 14 (fourteen) days., Disp: 2 each, Rfl: 11   FARXIGA 10 MG TABS tablet, TAKE 1 TABLET BY MOUTH EVERY DAY, Disp: 90 tablet, Rfl: 0   fenofibrate 160 MG tablet, TAKE 1 TABLET BY MOUTH EVERY DAY, Disp: 90 tablet, Rfl: 1   FLUoxetine (PROZAC) 40 MG capsule, Take 1 capsule (40 mg total) by mouth daily. LAST REFILL W/O APPT, Disp: 30 capsule, Rfl: 0   fluticasone (FLONASE) 50 MCG/ACT nasal spray, Place 1 spray into both nostrils daily as needed for allergies or rhinitis., Disp: 16 g, Rfl: 0   glipiZIDE (GLUCOTROL) 10 MG tablet, TAKE 1 TABLET (10 MG TOTAL) BY MOUTH 2 (TWO) TIMES DAILY BEFORE A MEAL., Disp: 180 tablet, Rfl: 1   HYDROcodone-acetaminophen (NORCO) 7.5-325 MG tablet, Take 1 tablet by mouth every 6 (six) hours as needed for moderate pain., Disp: 120 tablet, Rfl: 0   HYDROcodone-acetaminophen (NORCO/VICODIN) 5-325 MG tablet, Take 1.5 tablets by mouth every 6 (six) hours as needed for moderate pain., Disp: 160 tablet, Rfl: 0   lisinopril (ZESTRIL) 2.5 MG tablet, Take 1 tablet (2.5 mg total) by mouth daily. LAST REFILL W/O APPT, Disp: 30 tablet, Rfl: 0   loratadine (CLARITIN) 10 MG tablet, Take 10 mg by mouth daily., Disp: , Rfl:    meloxicam (MOBIC) 15 MG tablet, TAKE 1 TABLET BY MOUTH  EVERY DAY, Disp: 30 tablet, Rfl: 0   nitroGLYCERIN (NITROSTAT) 0.4 MG SL tablet, PLACE 1 TABLET UNDER THE TONGUE EVERY 5 MINUTES AS NEEDED FOR CHEST PAIN., Disp: 75 tablet, Rfl: 1   Semaglutide (RYBELSUS) 7 MG TABS, Take 7 mg by mouth daily., Disp: 90 tablet, Rfl: 3   tamsulosin (FLOMAX) 0.4 MG CAPS capsule, Take 1 capsule (0.4 mg total) by mouth daily. LAST REFILL W/O APPT, Disp: 30 capsule, Rfl: 0   testosterone cypionate (DEPOTESTOTERONE CYPIONATE) 100 MG/ML injection, INJECT 1 ML INTRAMUSCULARLY INTO THE MUSCLE EVERY 14 (FOURTEEN) DAYS., Disp: 6 mL, Rfl: 1   tiZANidine (ZANAFLEX) 4 MG tablet, TAKE  1 TABLET EVERY 6 HOURS AS NEEDED FOR MUSCLE SPASMS, Disp: 45 tablet, Rfl: 0   traZODone (DESYREL) 100 MG tablet, TAKE 1 TABLET BY MOUTH EVERYDAY AT BEDTIME (Patient taking differently: 150 mg.), Disp: 90 tablet, Rfl: 2  Allergies  Allergen Reactions   Penicillins Anaphylaxis    Did it involve swelling of the face/tongue/throat, SOB, or low BP? No Did it involve sudden or severe rash/hives, skin peeling, or any reaction on the inside of your mouth or nose? no Did you need to seek medical attention at a hospital or doctor's office? No When did it last happen?      Middle school If all above answers are "NO", may proceed with cephalosporin use.   Ambien [Zolpidem Tartrate] Other (See Comments)    nightmares   Cetirizine Other (See Comments)    Makes feel "drunk"   Gluten Meal Other (See Comments)    Diarrhea and headache   Lidocaine Swelling and Other (See Comments)    Redness that spreads.  PATIENT CAN USE PRESERVATIVE FREE LIDOCAINE Non preservative there is no problem   Pseudoephedrine Hcl Other (See Comments)    Makes feel "drunk"   Zolpidem Other (See Comments)    nightmares   Latex Itching, Swelling and Rash    Assessment and Plan:   Diagnosis: motion sickness. Please see myChart communication and orders below.   No orders of the defined types were placed in this encounter.  Meds ordered this encounter  Medications   scopolamine (TRANSDERM-SCOP) 1 MG/3DAYS    Sig: Place 1 patch (1.5 mg total) onto the skin every 3 (three) days.    Dispense:  4 patch    Refill:  1    Annye Asa, MD 07/13/2021  A total of 7 minutes were spent by me to personally review the patient-generated inquiry, review patient records and data pertinent to assessment of the patient's problem, develop a management plan including generation of prescriptions and/or orders, and on subsequent communication with the patient through secure the MyChart portal service.   There is no separately reported  E/M service related to this service in the past 7 days nor does the patient have an upcoming soonest available appointment for this issue. This work was completed in less than 7 days.   The patient consented to this service today (see patient agreement prior to ongoing communication). Patient counseled regarding the need for in-person exam for certain conditions and was advised to call the office if any changing or worsening symptoms occur.   The codes to be used for the E/M service are: '[x]'$   99421 for 5-10 minutes of time spent on the inquiry. '[]'$   [id number] for 11-20 minutes. '[]'$   [id number] for 21+ minutes.

## 2021-07-22 DIAGNOSIS — G4733 Obstructive sleep apnea (adult) (pediatric): Secondary | ICD-10-CM | POA: Diagnosis not present

## 2021-07-25 ENCOUNTER — Other Ambulatory Visit: Payer: Self-pay | Admitting: Internal Medicine

## 2021-07-27 ENCOUNTER — Other Ambulatory Visit: Payer: Self-pay | Admitting: Family Medicine

## 2021-07-28 ENCOUNTER — Other Ambulatory Visit: Payer: Self-pay | Admitting: Internal Medicine

## 2021-08-02 ENCOUNTER — Other Ambulatory Visit: Payer: Self-pay | Admitting: Family Medicine

## 2021-08-02 NOTE — Telephone Encounter (Signed)
Patient is requesting a refill of the following medications: Requested Prescriptions   Pending Prescriptions Disp Refills   meloxicam (MOBIC) 15 MG tablet [Pharmacy Med Name: MELOXICAM 15 MG TABLET] 30 tablet 0    Sig: TAKE 1 TABLET BY MOUTH EVERY DAY    Date of patient request: 08/02/21 Last office visit: 03/14/21 Date of last refill: 07/04/21 Last refill amount: 30

## 2021-08-11 DIAGNOSIS — F5101 Primary insomnia: Secondary | ICD-10-CM | POA: Diagnosis not present

## 2021-08-11 DIAGNOSIS — F411 Generalized anxiety disorder: Secondary | ICD-10-CM | POA: Diagnosis not present

## 2021-08-11 DIAGNOSIS — F4312 Post-traumatic stress disorder, chronic: Secondary | ICD-10-CM | POA: Diagnosis not present

## 2021-08-12 DIAGNOSIS — G4733 Obstructive sleep apnea (adult) (pediatric): Secondary | ICD-10-CM | POA: Diagnosis not present

## 2021-08-12 DIAGNOSIS — Z9989 Dependence on other enabling machines and devices: Secondary | ICD-10-CM | POA: Diagnosis not present

## 2021-08-12 DIAGNOSIS — G4700 Insomnia, unspecified: Secondary | ICD-10-CM | POA: Diagnosis not present

## 2021-08-17 ENCOUNTER — Encounter: Payer: Self-pay | Admitting: Family Medicine

## 2021-08-18 DIAGNOSIS — F5101 Primary insomnia: Secondary | ICD-10-CM | POA: Diagnosis not present

## 2021-08-18 DIAGNOSIS — F411 Generalized anxiety disorder: Secondary | ICD-10-CM | POA: Diagnosis not present

## 2021-08-18 DIAGNOSIS — F4312 Post-traumatic stress disorder, chronic: Secondary | ICD-10-CM | POA: Diagnosis not present

## 2021-08-19 ENCOUNTER — Other Ambulatory Visit: Payer: Self-pay | Admitting: Family Medicine

## 2021-08-19 MED ORDER — HYDROCODONE-ACETAMINOPHEN 7.5-325 MG PO TABS: 1.0000 | ORAL_TABLET | Freq: Four times a day (QID) | ORAL | 0 refills | Status: DC | PRN

## 2021-08-22 DIAGNOSIS — G4733 Obstructive sleep apnea (adult) (pediatric): Secondary | ICD-10-CM | POA: Diagnosis not present

## 2021-09-01 ENCOUNTER — Ambulatory Visit (INDEPENDENT_AMBULATORY_CARE_PROVIDER_SITE_OTHER): Payer: BC Managed Care – PPO | Admitting: Family Medicine

## 2021-09-01 ENCOUNTER — Other Ambulatory Visit: Payer: Self-pay | Admitting: Family Medicine

## 2021-09-01 ENCOUNTER — Encounter: Payer: Self-pay | Admitting: Family Medicine

## 2021-09-01 VITALS — BP 130/80 | HR 51 | Temp 97.8°F | Resp 16 | Ht 67.0 in | Wt 333.5 lb

## 2021-09-01 DIAGNOSIS — Z0000 Encounter for general adult medical examination without abnormal findings: Secondary | ICD-10-CM

## 2021-09-01 DIAGNOSIS — Z125 Encounter for screening for malignant neoplasm of prostate: Secondary | ICD-10-CM | POA: Diagnosis not present

## 2021-09-01 DIAGNOSIS — Z6841 Body Mass Index (BMI) 40.0 and over, adult: Secondary | ICD-10-CM

## 2021-09-01 DIAGNOSIS — E1142 Type 2 diabetes mellitus with diabetic polyneuropathy: Secondary | ICD-10-CM | POA: Diagnosis not present

## 2021-09-01 LAB — CBC WITH DIFFERENTIAL/PLATELET
Basophils Absolute: 0 10*3/uL (ref 0.0–0.1)
Basophils Relative: 0.4 % (ref 0.0–3.0)
Eosinophils Absolute: 0.2 10*3/uL (ref 0.0–0.7)
Eosinophils Relative: 4.6 % (ref 0.0–5.0)
HCT: 44.2 % (ref 39.0–52.0)
Hemoglobin: 14.9 g/dL (ref 13.0–17.0)
Lymphocytes Relative: 36.9 % (ref 12.0–46.0)
Lymphs Abs: 1.6 10*3/uL (ref 0.7–4.0)
MCHC: 33.8 g/dL (ref 30.0–36.0)
MCV: 93.9 fl (ref 78.0–100.0)
Monocytes Absolute: 0.4 10*3/uL (ref 0.1–1.0)
Monocytes Relative: 9.9 % (ref 3.0–12.0)
Neutro Abs: 2.1 10*3/uL (ref 1.4–7.7)
Neutrophils Relative %: 48.2 % (ref 43.0–77.0)
Platelets: 177 10*3/uL (ref 150.0–400.0)
RBC: 4.7 Mil/uL (ref 4.22–5.81)
RDW: 12.8 % (ref 11.5–15.5)
WBC: 4.4 10*3/uL (ref 4.0–10.5)

## 2021-09-01 LAB — BASIC METABOLIC PANEL
BUN: 18 mg/dL (ref 6–23)
CO2: 27 mEq/L (ref 19–32)
Calcium: 8.9 mg/dL (ref 8.4–10.5)
Chloride: 103 mEq/L (ref 96–112)
Creatinine, Ser: 1.01 mg/dL (ref 0.40–1.50)
GFR: 84.36 mL/min (ref >60.00)
Glucose, Bld: 122 mg/dL — ABNORMAL HIGH (ref 70–99)
Potassium: 4.1 mEq/L (ref 3.5–5.1)
Sodium: 139 mEq/L (ref 135–145)

## 2021-09-01 LAB — LIPID PANEL
Cholesterol: 129 mg/dL (ref 0–200)
HDL: 46.6 mg/dL (ref >39.00)
LDL Cholesterol: 64 mg/dL (ref 0–99)
NonHDL: 81.9
Total CHOL/HDL Ratio: 3
Triglycerides: 90 mg/dL (ref 0.0–149.0)
VLDL: 18 mg/dL (ref 0.0–40.0)

## 2021-09-01 LAB — HEMOGLOBIN A1C: Hgb A1c MFr Bld: 6.5 % (ref 4.6–6.5)

## 2021-09-01 LAB — MICROALBUMIN / CREATININE URINE RATIO
Creatinine,U: 93.6 mg/dL
Microalb Creat Ratio: 0.7 mg/g (ref 0.0–30.0)
Microalb, Ur: <0.7 mg/dL (ref 0.0–1.9)

## 2021-09-01 LAB — HEPATIC FUNCTION PANEL
ALT: 25 U/L (ref 0–53)
AST: 19 U/L (ref 0–37)
Albumin: 4.1 g/dL (ref 3.5–5.2)
Alkaline Phosphatase: 48 U/L (ref 39–117)
Bilirubin, Direct: 0.1 mg/dL (ref 0.0–0.3)
Total Bilirubin: 0.5 mg/dL (ref 0.2–1.2)
Total Protein: 6.4 g/dL (ref 6.0–8.3)

## 2021-09-01 LAB — VITAMIN D 25 HYDROXY (VIT D DEFICIENCY, FRACTURES): VITD: 21.73 ng/mL — ABNORMAL LOW (ref 30.00–100.00)

## 2021-09-01 LAB — TSH: TSH: 2.71 u[IU]/mL (ref 0.35–5.50)

## 2021-09-01 LAB — PSA: PSA: 0.07 ng/mL — ABNORMAL LOW (ref 0.10–4.00)

## 2021-09-01 NOTE — Progress Notes (Signed)
   Subjective:    Patient ID: Caleb White., male    DOB: [birth year], 56 y.o.   MRN: [id number]  HPI CPE- no concerns today.  UTD on eye exam, colonoscopy, foot exam (done today), Tdap  Patient Care Team    Relationship Specialty Notifications Start End  [name], MD PCP - General Family Medicine  09/24/15   [name], [place] Physician Podiatry  09/24/15   [name], MD Consulting Physician Ophthalmology  09/24/15   [name], MD Referring Physician Neurology  06/01/16     Health Maintenance  Topic Date Due   HEMOGLOBIN A1C  06/14/2021   INFLUENZA VACCINE  08/16/2021   Zoster Vaccines- Shingrix (1 of 2) 12/02/2021 (Originally 03/27/2017)   OPHTHALMOLOGY EXAM  06/23/2022   FOOT EXAM  09/02/2022   TETANUS/TDAP  02/14/2026   COLONOSCOPY (Pts 45-28yr Insurance coverage will need to be confirmed)  04/21/2029   HPV VACCINES  Aged Out   COVID-19 Vaccine  Discontinued   Hepatitis C Screening  Discontinued   HIV Screening  Discontinued     Review of Systems Patient reports no vision/hearing changes, anorexia, fever ,adenopathy, persistant/recurrent hoarseness, swallowing issues, chest pain, palpitations, edema, persistant/recurrent cough, hemoptysis, dyspnea (rest,exertional, paroxysmal nocturnal), gastrointestinal  bleeding (melena, rectal bleeding), abdominal pain, excessive heart burn, GU symptoms (dysuria, hematuria, voiding/incontinence issues) syncope, focal weakness, memory loss, numbness & tingling, skin/hair/nail changes, depression, anxiety, abnormal bruising/bleeding, musculoskeletal symptoms/signs.   + 12 lb weight gain    Objective:   Physical Exam General Appearance:    Alert, cooperative, no distress, appears stated age  Head:    Normocephalic, without obvious abnormality, atraumatic  Eyes:    PERRL, conjunctiva/corneas clear, EOM's intact, fundi    benign, both eyes       Ears:    Normal TM's and external ear canals, both ears   Nose:   Nares normal, septum midline, mucosa normal, no drainage   or sinus tenderness  Throat:   Lips, mucosa, and tongue normal; teeth and gums normal  Neck:   Supple, symmetrical, trachea midline, no adenopathy;       thyroid:  No enlargement/tenderness/nodules  Back:     Symmetric, no curvature, ROM normal, no CVA tenderness  Lungs:     Clear to auscultation bilaterally, respirations unlabored  Chest wall:    No tenderness or deformity  Heart:    Regular rate and rhythm, S1 and S2 normal, no murmur, rub   or gallop  Abdomen:     Soft, non-tender, bowel sounds active all four quadrants,    no masses, no organomegaly  Genitalia:    Normal male without lesion, masses,discharge or tenderness  Rectal:    Deferred due to young age  Extremities:   Extremities normal, atraumatic, no cyanosis or edema  Pulses:   2+ and symmetric all extremities  Skin:   Skin color, texture, turgor normal, no rashes or lesions  Lymph nodes:   Cervical, supraclavicular, and axillary nodes normal  Neurologic:   CNII-XII intact. Normal strength, sensation and reflexes      throughout          Assessment & Plan:

## 2021-09-01 NOTE — Assessment & Plan Note (Signed)
Pt's PE WNL w/ exception of BMI.  UTD on colonoscopy, Tdap.  Check labs.  Anticipatory guidance provided.  

## 2021-09-01 NOTE — Assessment & Plan Note (Signed)
Chronic problem.  UTD on eye exam, foot exam.  Will get microalbumin.  Encouraged low carb diet and regular exercise.  Check labs.  Adjust meds prn

## 2021-09-01 NOTE — Patient Instructions (Signed)
Follow up in 6 months to recheck diabetes, blood pressure, cholesterol We'll notify you of your lab results and make any changes if needed Continue to work on low carb diet and regular physical activity Call with any questions or concerns Stay Safe!  Stay Healthy! Enjoy the rest of your summer!!!

## 2021-09-01 NOTE — Assessment & Plan Note (Signed)
Pt has gained 12 lbs since last visit.  Stressed need for low carb diet and regular exercise.  Check labs to risk stratify.  Will follow.

## 2021-09-02 ENCOUNTER — Other Ambulatory Visit: Payer: Self-pay

## 2021-09-02 DIAGNOSIS — G4700 Insomnia, unspecified: Secondary | ICD-10-CM | POA: Diagnosis not present

## 2021-09-02 DIAGNOSIS — G4733 Obstructive sleep apnea (adult) (pediatric): Secondary | ICD-10-CM | POA: Diagnosis not present

## 2021-09-02 DIAGNOSIS — R7989 Other specified abnormal findings of blood chemistry: Secondary | ICD-10-CM

## 2021-09-02 DIAGNOSIS — Z9989 Dependence on other enabling machines and devices: Secondary | ICD-10-CM | POA: Diagnosis not present

## 2021-09-02 MED ORDER — VITAMIN D (ERGOCALCIFEROL) 1.25 MG (50000 UNIT) PO CAPS: 50000.0000 [IU] | ORAL_CAPSULE | ORAL | 12 refills | Status: DC

## 2021-09-02 NOTE — Progress Notes (Signed)
Pt seen results Via my chart . Vit D 50,000 units has been sent to pharmacy

## 2021-09-04 ENCOUNTER — Other Ambulatory Visit: Payer: Self-pay | Admitting: Family Medicine

## 2021-09-05 NOTE — Telephone Encounter (Signed)
Patient is requesting a refill of the following medications: Requested Prescriptions   Pending Prescriptions Disp Refills   meloxicam (MOBIC) 15 MG tablet [Pharmacy Med Name: MELOXICAM 15 MG TABLET] 30 tablet 0    Sig: TAKE 1 TABLET BY MOUTH EVERY DAY    Date of patient request: 09/05/21 Last office visit: 09/01/21 Date of last refill: 08/02/21 Last refill amount: 30

## 2021-09-06 ENCOUNTER — Other Ambulatory Visit: Payer: Self-pay | Admitting: Family Medicine

## 2021-09-06 ENCOUNTER — Other Ambulatory Visit: Payer: Self-pay | Admitting: Lab

## 2021-09-06 ENCOUNTER — Other Ambulatory Visit: Payer: Self-pay | Admitting: Internal Medicine

## 2021-09-07 ENCOUNTER — Other Ambulatory Visit: Payer: Self-pay | Admitting: Internal Medicine

## 2021-09-07 ENCOUNTER — Other Ambulatory Visit: Payer: Self-pay | Admitting: Family Medicine

## 2021-09-08 DIAGNOSIS — E119 Type 2 diabetes mellitus without complications: Secondary | ICD-10-CM | POA: Diagnosis not present

## 2021-09-08 DIAGNOSIS — H524 Presbyopia: Secondary | ICD-10-CM | POA: Diagnosis not present

## 2021-09-08 DIAGNOSIS — Z7984 Long term (current) use of oral hypoglycemic drugs: Secondary | ICD-10-CM | POA: Diagnosis not present

## 2021-09-08 LAB — HM DIABETES EYE EXAM

## 2021-09-12 ENCOUNTER — Encounter: Payer: Self-pay | Admitting: Family Medicine

## 2021-09-14 ENCOUNTER — Other Ambulatory Visit: Payer: Self-pay | Admitting: Family Medicine

## 2021-09-14 ENCOUNTER — Encounter: Payer: Self-pay | Admitting: Family Medicine

## 2021-09-14 MED ORDER — HYDROCODONE-ACETAMINOPHEN 7.5-325 MG PO TABS: 1.0000 | ORAL_TABLET | Freq: Four times a day (QID) | ORAL | 0 refills | Status: DC | PRN

## 2021-09-15 DIAGNOSIS — R3915 Urgency of urination: Secondary | ICD-10-CM | POA: Diagnosis not present

## 2021-09-15 DIAGNOSIS — Z3009 Encounter for other general counseling and advice on contraception: Secondary | ICD-10-CM | POA: Diagnosis not present

## 2021-09-23 DIAGNOSIS — Z302 Encounter for sterilization: Secondary | ICD-10-CM | POA: Diagnosis not present

## 2021-09-26 ENCOUNTER — Other Ambulatory Visit: Payer: Self-pay | Admitting: Family Medicine

## 2021-09-29 ENCOUNTER — Other Ambulatory Visit: Payer: Self-pay | Admitting: Family Medicine

## 2021-09-29 NOTE — Telephone Encounter (Signed)
Patient is requesting a refill of the following medications: Requested Prescriptions   Pending Prescriptions Disp Refills   FLUoxetine (PROZAC) 40 MG capsule [Pharmacy Med Name: FLUOXETINE HCL 40 MG CAPSULE] 90 capsule 1    Sig: TAKE 1 CAPSULE (40 MG TOTAL) BY MOUTH DAILY. LAST REFILL W/O APPT    Patient would like a 90 day supply and has an appointment to morrow. Patient should have about 7 pills left according to last refill   Date of patient request: 09/29/2021 Last office visit: 09/01/2021 Date of last refill: 09/06/2021 Last refill amount: 30 capsules  Follow up time period per chart: 09/30/2021

## 2021-09-30 ENCOUNTER — Other Ambulatory Visit: Payer: Self-pay

## 2021-09-30 ENCOUNTER — Ambulatory Visit: Payer: BC Managed Care – PPO | Admitting: Family Medicine

## 2021-09-30 ENCOUNTER — Encounter: Payer: Self-pay | Admitting: Family Medicine

## 2021-09-30 DIAGNOSIS — Z6841 Body Mass Index (BMI) 40.0 and over, adult: Secondary | ICD-10-CM

## 2021-09-30 DIAGNOSIS — Z23 Encounter for immunization: Secondary | ICD-10-CM

## 2021-09-30 DIAGNOSIS — I10 Essential (primary) hypertension: Secondary | ICD-10-CM | POA: Diagnosis not present

## 2021-09-30 NOTE — Patient Instructions (Signed)
Follow up as scheduled or as needed Continue to work on healthy diet and regular exercise- you can do it! Call with any questions or concerns Have a great weekend!!

## 2021-09-30 NOTE — Assessment & Plan Note (Signed)
Chronic problem.  Excellent control today.  On Lisinopril 2.'5mg'$  daily w/o difficulty.  Labs last month WNL.  No changes.

## 2021-09-30 NOTE — Assessment & Plan Note (Signed)
Ongoing issue for pt.  Weight is stable and seems to have plateau'd.  He states that he is going to work hard on healthy diet and regular exercise.  Will follow.

## 2021-09-30 NOTE — Progress Notes (Signed)
   Subjective:    Patient ID: Caleb Nissen., male    DOB: August 22, 1965, 56 y.o.   MRN: [id number]  HPI Obesity- pt reports he has hit a plateau at 333 lbs.  He states he plans to take his weight loss 'more seriously'.  Already down 1 size.  HTN- chronic problem, on Lisinopril 2.'5mg'$  daily.  No CP, SOB, HAs, visual changes, edema.   Review of Systems For ROS see HPI     Objective:   Physical Exam Vitals reviewed.  Constitutional:      General: He is not in acute distress.    Appearance: Normal appearance. He is obese. He is not ill-appearing.  HENT:     Head: Normocephalic and atraumatic.  Cardiovascular:     Rate and Rhythm: Normal rate and regular rhythm.     Pulses: Normal pulses.  Pulmonary:     Effort: Pulmonary effort is normal. No respiratory distress.     Breath sounds: Normal breath sounds. No wheezing or rhonchi.  Skin:    General: Skin is warm and dry.  Neurological:     General: No focal deficit present.     Mental Status: He is alert and oriented to person, place, and time.  Psychiatric:        Mood and Affect: Mood normal.        Behavior: Behavior normal.        Thought Content: Thought content normal.           Assessment & Plan:

## 2021-10-03 DIAGNOSIS — G4733 Obstructive sleep apnea (adult) (pediatric): Secondary | ICD-10-CM | POA: Diagnosis not present

## 2021-10-04 ENCOUNTER — Other Ambulatory Visit: Payer: Self-pay | Admitting: Family Medicine

## 2021-10-05 DIAGNOSIS — G4733 Obstructive sleep apnea (adult) (pediatric): Secondary | ICD-10-CM | POA: Diagnosis not present

## 2021-10-11 ENCOUNTER — Other Ambulatory Visit: Payer: Self-pay | Admitting: Family Medicine

## 2021-10-19 ENCOUNTER — Encounter: Payer: Self-pay | Admitting: Family Medicine

## 2021-10-20 MED ORDER — HYDROCODONE-ACETAMINOPHEN 7.5-325 MG PO TABS: 1.0000 | ORAL_TABLET | Freq: Four times a day (QID) | ORAL | 0 refills | Status: DC | PRN

## 2021-10-22 ENCOUNTER — Other Ambulatory Visit: Payer: Self-pay | Admitting: Internal Medicine

## 2021-10-27 ENCOUNTER — Telehealth: Payer: Self-pay

## 2021-10-27 NOTE — Telephone Encounter (Signed)
Informed pt that his PA for the Rx hydrocodone has been approved from 10/27/21-01/27/22. Ask pt to contact pharmacy to fill

## 2021-10-28 ENCOUNTER — Other Ambulatory Visit: Payer: Self-pay | Admitting: Family Medicine

## 2021-10-28 ENCOUNTER — Encounter: Payer: Self-pay | Admitting: Family Medicine

## 2021-10-28 MED ORDER — HYDROCODONE-ACETAMINOPHEN 7.5-325 MG PO TABS: 1.0000 | ORAL_TABLET | Freq: Four times a day (QID) | ORAL | 0 refills | Status: DC | PRN

## 2021-10-28 NOTE — Telephone Encounter (Signed)
Patient called in stating that the [facility] in [place] did not have the hydrocodone in stock and was unsure of when they could get it. He wanted to know if it could be sent to the [facility] in [place] instead. He did call to confirm that they had it in stock. I also called [facility] to confirm [facility] not [facility] and they were out of stock.

## 2021-10-31 NOTE — Progress Notes (Signed)
This encounter was created in error - please disregard.

## 2021-11-02 DIAGNOSIS — G4733 Obstructive sleep apnea (adult) (pediatric): Secondary | ICD-10-CM | POA: Diagnosis not present

## 2021-11-02 DIAGNOSIS — Z9989 Dependence on other enabling machines and devices: Secondary | ICD-10-CM | POA: Diagnosis not present

## 2021-11-02 DIAGNOSIS — G4700 Insomnia, unspecified: Secondary | ICD-10-CM | POA: Diagnosis not present

## 2021-11-04 DIAGNOSIS — G4733 Obstructive sleep apnea (adult) (pediatric): Secondary | ICD-10-CM | POA: Diagnosis not present

## 2021-11-09 DIAGNOSIS — M79672 Pain in left foot: Secondary | ICD-10-CM | POA: Diagnosis not present

## 2021-11-10 ENCOUNTER — Other Ambulatory Visit: Payer: Self-pay | Admitting: Family Medicine

## 2021-11-17 DIAGNOSIS — S93602A Unspecified sprain of left foot, initial encounter: Secondary | ICD-10-CM | POA: Diagnosis not present

## 2021-11-17 DIAGNOSIS — M79672 Pain in left foot: Secondary | ICD-10-CM | POA: Diagnosis not present

## 2021-11-21 DIAGNOSIS — M79672 Pain in left foot: Secondary | ICD-10-CM | POA: Diagnosis not present

## 2021-11-23 DIAGNOSIS — G4733 Obstructive sleep apnea (adult) (pediatric): Secondary | ICD-10-CM | POA: Diagnosis not present

## 2021-11-24 DIAGNOSIS — S9032XA Contusion of left foot, initial encounter: Secondary | ICD-10-CM | POA: Diagnosis not present

## 2021-11-28 ENCOUNTER — Encounter: Payer: Self-pay | Admitting: Family Medicine

## 2021-11-28 ENCOUNTER — Other Ambulatory Visit: Payer: Self-pay

## 2021-11-28 NOTE — Telephone Encounter (Signed)
Pt is asking of refill on Hydrocodone 7.5-325 mg LOV: 09/30/21 Last Refill:10/28/21 Upcoming appt: 03/02/22

## 2021-11-29 MED ORDER — HYDROCODONE-ACETAMINOPHEN 7.5-325 MG PO TABS: 1.0000 | ORAL_TABLET | Freq: Four times a day (QID) | ORAL | 0 refills | Status: DC | PRN

## 2021-12-03 ENCOUNTER — Other Ambulatory Visit: Payer: Self-pay | Admitting: Family Medicine

## 2021-12-03 ENCOUNTER — Other Ambulatory Visit: Payer: Self-pay | Admitting: Internal Medicine

## 2021-12-03 DIAGNOSIS — G4733 Obstructive sleep apnea (adult) (pediatric): Secondary | ICD-10-CM | POA: Diagnosis not present

## 2021-12-03 DIAGNOSIS — Z9989 Dependence on other enabling machines and devices: Secondary | ICD-10-CM | POA: Diagnosis not present

## 2021-12-03 DIAGNOSIS — G4700 Insomnia, unspecified: Secondary | ICD-10-CM | POA: Diagnosis not present

## 2021-12-05 DIAGNOSIS — G4733 Obstructive sleep apnea (adult) (pediatric): Secondary | ICD-10-CM | POA: Diagnosis not present

## 2021-12-05 NOTE — Telephone Encounter (Signed)
Patient is requesting a refill of the following medications: Requested Prescriptions   Pending Prescriptions Disp Refills   meloxicam (MOBIC) 15 MG tablet [Pharmacy Med Name: MELOXICAM 15 MG TABLET] 90 tablet 0    Sig: TAKE 1 TABLET BY MOUTH EVERY DAY    Date of patient request: 12/05/21 Last office visit: 09/06/21 Date of last refill: 09/30/21 Last refill amount: 90

## 2021-12-12 MED ORDER — HYDROCODONE-ACETAMINOPHEN 7.5-325 MG PO TABS: 1.0000 | ORAL_TABLET | Freq: Four times a day (QID) | ORAL | 0 refills | Status: DC | PRN

## 2021-12-22 DIAGNOSIS — M25572 Pain in left ankle and joints of left foot: Secondary | ICD-10-CM | POA: Diagnosis not present

## 2021-12-22 DIAGNOSIS — S9032XA Contusion of left foot, initial encounter: Secondary | ICD-10-CM | POA: Diagnosis not present

## 2021-12-22 DIAGNOSIS — S93602A Unspecified sprain of left foot, initial encounter: Secondary | ICD-10-CM | POA: Diagnosis not present

## 2022-01-03 DIAGNOSIS — G4733 Obstructive sleep apnea (adult) (pediatric): Secondary | ICD-10-CM | POA: Diagnosis not present

## 2022-01-05 DIAGNOSIS — G4733 Obstructive sleep apnea (adult) (pediatric): Secondary | ICD-10-CM | POA: Diagnosis not present

## 2022-01-13 ENCOUNTER — Encounter: Payer: Self-pay | Admitting: Family Medicine

## 2022-01-13 MED ORDER — HYDROCODONE-ACETAMINOPHEN 7.5-325 MG PO TABS: 1.0000 | ORAL_TABLET | Freq: Four times a day (QID) | ORAL | 0 refills | Status: DC | PRN

## 2022-01-13 NOTE — Telephone Encounter (Signed)
Refill request in PCPs absence.  Chart reviewed.  Note reviewed for usual monthly amount of 120 pills on 11/28/2021.  Controlled substance database reviewed.  #30 of hydrocodone 7.5 on 11/29/2021, #120 filled 12/12/2021.  Prescription refilled with further refills from PCP.

## 2022-01-13 NOTE — Telephone Encounter (Signed)
Patient is requesting a refill of the following medications: Requested Prescriptions   Pending Prescriptions Disp Refills   HYDROcodone-acetaminophen (NORCO) 7.5-325 MG tablet 120 tablet 0    Sig: Take 1 tablet by mouth every 6 (six) hours as needed for moderate pain.    Date of patient request: 01/13/22 Last office visit: 09/30/21 Date of last refill: 12/12/21 Last refill amount: 120

## 2022-01-31 ENCOUNTER — Other Ambulatory Visit: Payer: Self-pay | Admitting: Family Medicine

## 2022-02-03 DIAGNOSIS — G4733 Obstructive sleep apnea (adult) (pediatric): Secondary | ICD-10-CM | POA: Diagnosis not present

## 2022-02-07 ENCOUNTER — Other Ambulatory Visit: Payer: Self-pay | Admitting: Internal Medicine

## 2022-02-20 ENCOUNTER — Encounter: Payer: Self-pay | Admitting: Family Medicine

## 2022-02-20 ENCOUNTER — Other Ambulatory Visit: Payer: Self-pay

## 2022-02-20 MED ORDER — HYDROCODONE-ACETAMINOPHEN 7.5-325 MG PO TABS: 1.0000 | ORAL_TABLET | Freq: Four times a day (QID) | ORAL | 0 refills | Status: DC | PRN

## 2022-02-20 NOTE — Telephone Encounter (Signed)
Pt aware rx has been sent in  

## 2022-02-20 NOTE — Telephone Encounter (Signed)
Hydrocodone 7.5 mg LOV: 09/30/21 Last Refill:01/13/22 Upcoming appt: 03/02/22

## 2022-03-02 ENCOUNTER — Encounter: Payer: Self-pay | Admitting: Family Medicine

## 2022-03-02 ENCOUNTER — Ambulatory Visit: Payer: BC Managed Care – PPO | Admitting: Family Medicine

## 2022-03-02 VITALS — BP 128/80 | HR 56 | Temp 97.6°F | Resp 17 | Ht 67.0 in | Wt 340.0 lb

## 2022-03-02 DIAGNOSIS — Z6841 Body Mass Index (BMI) 40.0 and over, adult: Secondary | ICD-10-CM | POA: Diagnosis not present

## 2022-03-02 DIAGNOSIS — I10 Essential (primary) hypertension: Secondary | ICD-10-CM | POA: Diagnosis not present

## 2022-03-02 DIAGNOSIS — E1142 Type 2 diabetes mellitus with diabetic polyneuropathy: Secondary | ICD-10-CM

## 2022-03-02 DIAGNOSIS — E1169 Type 2 diabetes mellitus with other specified complication: Secondary | ICD-10-CM | POA: Diagnosis not present

## 2022-03-02 DIAGNOSIS — Z23 Encounter for immunization: Secondary | ICD-10-CM

## 2022-03-02 DIAGNOSIS — E785 Hyperlipidemia, unspecified: Secondary | ICD-10-CM

## 2022-03-02 LAB — HEPATIC FUNCTION PANEL
ALT: 21 U/L (ref 0–53)
AST: 16 U/L (ref 0–37)
Albumin: 4 g/dL (ref 3.5–5.2)
Alkaline Phosphatase: 40 U/L (ref 39–117)
Bilirubin, Direct: 0.1 mg/dL (ref 0.0–0.3)
Total Bilirubin: 0.4 mg/dL (ref 0.2–1.2)
Total Protein: 6.7 g/dL (ref 6.0–8.3)

## 2022-03-02 LAB — CBC WITH DIFFERENTIAL/PLATELET
Basophils Absolute: 0 10*3/uL (ref 0.0–0.1)
Basophils Relative: 0.4 % (ref 0.0–3.0)
Eosinophils Absolute: 0.2 10*3/uL (ref 0.0–0.7)
Eosinophils Relative: 3.8 % (ref 0.0–5.0)
HCT: 45 % (ref 39.0–52.0)
Hemoglobin: 15.5 g/dL (ref 13.0–17.0)
Lymphocytes Relative: 37.7 % (ref 12.0–46.0)
Lymphs Abs: 1.5 10*3/uL (ref 0.7–4.0)
MCHC: 34.3 g/dL (ref 30.0–36.0)
MCV: 93.1 fl (ref 78.0–100.0)
Monocytes Absolute: 0.5 10*3/uL (ref 0.1–1.0)
Monocytes Relative: 11.4 % (ref 3.0–12.0)
Neutro Abs: 1.9 10*3/uL (ref 1.4–7.7)
Neutrophils Relative %: 46.7 % (ref 43.0–77.0)
Platelets: 203 10*3/uL (ref 150.0–400.0)
RBC: 4.83 Mil/uL (ref 4.22–5.81)
RDW: 13 % (ref 11.5–15.5)
WBC: 4.1 10*3/uL (ref 4.0–10.5)

## 2022-03-02 LAB — LIPID PANEL
Cholesterol: 170 mg/dL (ref 0–200)
HDL: 49.6 mg/dL (ref >39.00)
LDL Cholesterol: 100 mg/dL — ABNORMAL HIGH (ref 0–99)
NonHDL: 120.74
Total CHOL/HDL Ratio: 3
Triglycerides: 102 mg/dL (ref 0.0–149.0)
VLDL: 20.4 mg/dL (ref 0.0–40.0)

## 2022-03-02 LAB — BASIC METABOLIC PANEL
BUN: 16 mg/dL (ref 6–23)
CO2: 26 mEq/L (ref 19–32)
Calcium: 9.3 mg/dL (ref 8.4–10.5)
Chloride: 105 mEq/L (ref 96–112)
Creatinine, Ser: 0.87 mg/dL (ref 0.40–1.50)
GFR: 97.53 mL/min (ref >60.00)
Glucose, Bld: 130 mg/dL — ABNORMAL HIGH (ref 70–99)
Potassium: 4 mEq/L (ref 3.5–5.1)
Sodium: 138 mEq/L (ref 135–145)

## 2022-03-02 LAB — HEMOGLOBIN A1C: Hgb A1c MFr Bld: 7.3 % — ABNORMAL HIGH (ref 4.6–6.5)

## 2022-03-02 LAB — TSH: TSH: 2.3 u[IU]/mL (ref 0.35–5.50)

## 2022-03-02 NOTE — Assessment & Plan Note (Signed)
Chronic problem.  Currently on Atorvastatin 31m daily and Fenofibrate 1658mdaily w/o difficulty.  Encouraged healthy diet and regular exercise.  Check labs.  Adjust meds prn

## 2022-03-02 NOTE — Assessment & Plan Note (Signed)
Deteriorated.  Pt has gained 6 lbs since last visit.  Encouraged low carb diet and regular physical activity.  Will continue to follow.

## 2022-03-02 NOTE — Patient Instructions (Signed)
Schedule your complete physical in 6 months We'll notify you of your lab results and make any changes if needed Continue to work on healthy diet and regular exercise- you can do it!! Call with any questions or concerns Stay Safe!  Stay Healthy! Happy Spring!!!

## 2022-03-02 NOTE — Assessment & Plan Note (Signed)
Chronic problem.  Supposed to be seeing Endo but has not followed up recently.  Currently on Farxiga, Glipizide, Rybelsus.  UTD on foot exam, eye exam, and microalbumin.  He admits sugars have been running high and he is eating more and not the right things.  Plans to work on his stress levels to improve his eating habits.  Check labs.  Adjust meds prn

## 2022-03-02 NOTE — Assessment & Plan Note (Signed)
Chronic problem.  Adequate control today on Lisinopril 2.69m daily.  Asymptomatic.  Check labs due to ACE but no anticipated med changes.

## 2022-03-02 NOTE — Progress Notes (Signed)
   Subjective:    Patient ID: Caleb Nissen., male    DOB: [birth year], 57 y.o.   MRN: [id number]  HPI DM- chronic problem, on Farxiga '10mg'$  daily, Glipizide '10mg'$  daily, Rybelsus '7mg'$  daily.  UTD on foot exam, eye exam, microalbumin.  Follows w/ Endo.  Pt reports sugars have been running high.  Denies symptomatic lows.  Hyperlipidemia- chronic problem, on Atorvastatin '10mg'$  daily and Fenofibrate '160mg'$  daily.  No abd pain, N/V.  HTN- chronic problem, adequate control on Lisinopril 2.'5mg'$ .  no CP, SOB, HA's, visual changes, edema.  Morbid obesity- pt has gained 6 lbs since last visit.   Review of Systems For ROS see HPI     Objective:   Physical Exam Vitals reviewed.  Constitutional:      General: He is not in acute distress.    Appearance: Normal appearance. He is well-developed. He is obese. He is not ill-appearing.  HENT:     Head: Normocephalic and atraumatic.  Eyes:     Extraocular Movements: Extraocular movements intact.     Conjunctiva/sclera: Conjunctivae normal.     Pupils: Pupils are equal, round, and reactive to light.  Neck:     Thyroid: No thyromegaly.  Cardiovascular:     Rate and Rhythm: Normal rate and regular rhythm.     Pulses: Normal pulses.     Heart sounds: Normal heart sounds. No murmur heard. Pulmonary:     Effort: Pulmonary effort is normal. No respiratory distress.     Breath sounds: Normal breath sounds.  Abdominal:     General: Bowel sounds are normal. There is no distension.     Palpations: Abdomen is soft.  Musculoskeletal:     Cervical back: Normal range of motion and neck supple.     Right lower leg: No edema.     Left lower leg: No edema.  Lymphadenopathy:     Cervical: No cervical adenopathy.  Skin:    General: Skin is warm and dry.  Neurological:     General: No focal deficit present.     Mental Status: He is alert and oriented to person, place, and time.     Cranial Nerves: No cranial nerve deficit.  Psychiatric:        Mood and  Affect: Mood normal.        Behavior: Behavior normal.           Assessment & Plan:

## 2022-03-03 ENCOUNTER — Telehealth: Payer: Self-pay

## 2022-03-03 NOTE — Telephone Encounter (Signed)
-----   Message from [name], MD sent at 03/03/2022  7:44 AM EST ----- A1C has bumped up from 6.5 --> 7.3%  This will improve w/ low carb, healthy diet and regular physical activity.  No med changes at this time.  Remainder of labs are stable and look good!

## 2022-03-03 NOTE — Telephone Encounter (Signed)
Left pt a VM with lab results

## 2022-03-04 ENCOUNTER — Other Ambulatory Visit: Payer: Self-pay | Admitting: Family Medicine

## 2022-03-06 DIAGNOSIS — G4733 Obstructive sleep apnea (adult) (pediatric): Secondary | ICD-10-CM | POA: Diagnosis not present

## 2022-03-25 ENCOUNTER — Other Ambulatory Visit: Payer: Self-pay | Admitting: Family Medicine

## 2022-04-04 DIAGNOSIS — G4733 Obstructive sleep apnea (adult) (pediatric): Secondary | ICD-10-CM | POA: Diagnosis not present

## 2022-04-06 DIAGNOSIS — G4733 Obstructive sleep apnea (adult) (pediatric): Secondary | ICD-10-CM | POA: Diagnosis not present

## 2022-04-10 ENCOUNTER — Encounter: Payer: Self-pay | Admitting: Family Medicine

## 2022-04-11 ENCOUNTER — Telehealth: Payer: Self-pay

## 2022-04-11 MED ORDER — HYDROCODONE-ACETAMINOPHEN 7.5-325 MG PO TABS: 1.0000 | ORAL_TABLET | Freq: Four times a day (QID) | ORAL | 0 refills | Status: DC | PRN

## 2022-04-11 NOTE — Telephone Encounter (Signed)
This is a Dr Birdie Riddle pt asking for a refill can you refill please ?  Hydrocodone 7.5 mg LOV: 03/02/22 Last Refill:02/20/22 Upcoming appt: none

## 2022-04-11 NOTE — Telephone Encounter (Signed)
Refill request for hydrocodone in PCPs absence.  Chart reviewed, controlled substance database reviewed.  Hydrocodone 7.5 mg #120 last filled 02/20/2022, previously 01/13/2022, 12/12/2021.  Recent visit with Dr. Birdie Riddle in February.  Refill ordered.

## 2022-04-30 ENCOUNTER — Other Ambulatory Visit: Payer: Self-pay | Admitting: Internal Medicine

## 2022-05-02 ENCOUNTER — Other Ambulatory Visit: Payer: Self-pay | Admitting: Family Medicine

## 2022-05-05 DIAGNOSIS — G4733 Obstructive sleep apnea (adult) (pediatric): Secondary | ICD-10-CM | POA: Diagnosis not present

## 2022-05-08 ENCOUNTER — Encounter: Payer: Self-pay | Admitting: Family Medicine

## 2022-05-08 ENCOUNTER — Other Ambulatory Visit: Payer: Self-pay

## 2022-05-08 MED ORDER — HYDROCODONE-ACETAMINOPHEN 7.5-325 MG PO TABS: 1.0000 | ORAL_TABLET | Freq: Four times a day (QID) | ORAL | 0 refills | Status: DC | PRN

## 2022-05-08 NOTE — Telephone Encounter (Signed)
Hydrocodone 7.5 mg LOV: 03/02/22 Last Refill:04/11/22 Upcoming appt: none

## 2022-05-08 NOTE — Telephone Encounter (Signed)
Pt aware Rx has been sent in.  

## 2022-06-04 DIAGNOSIS — G4733 Obstructive sleep apnea (adult) (pediatric): Secondary | ICD-10-CM | POA: Diagnosis not present

## 2022-06-22 ENCOUNTER — Other Ambulatory Visit: Payer: Self-pay | Admitting: Family Medicine

## 2022-06-23 MED ORDER — HYDROCODONE-ACETAMINOPHEN 7.5-325 MG PO TABS: 1.0000 | ORAL_TABLET | Freq: Four times a day (QID) | ORAL | 0 refills | Status: DC | PRN

## 2022-06-23 NOTE — Telephone Encounter (Signed)
Patient is requesting a refill of the following medications: Requested Prescriptions   Pending Prescriptions Disp Refills   HYDROcodone-acetaminophen (NORCO) 7.5-325 MG tablet 120 tablet 0    Sig: Take 1 tablet by mouth every 6 (six) hours as needed for moderate pain.    Date of patient request: 06/23/22 Last office visit: 03/02/22 Date of last refill: 05/08/22 Last refill amount: 120 tab Follow up time period per chart: 6 months

## 2022-06-23 NOTE — Telephone Encounter (Signed)
Hydrocodone  7.5 -325 mg LOV: 03/02/22 Last /Refill:05/08/22 Upcoming appt: none Pt is a Dr Beverely Low pt can you please refill in her absence

## 2022-07-10 ENCOUNTER — Telehealth: Payer: Self-pay | Admitting: Family Medicine

## 2022-07-10 NOTE — Telephone Encounter (Signed)
Placed in file cabinet up front

## 2022-07-10 NOTE — Telephone Encounter (Signed)
Caleb White) Caleb White will be here to pick up patients immunization records per patient, he will not be able to grab them.

## 2022-07-23 ENCOUNTER — Other Ambulatory Visit: Payer: Self-pay | Admitting: Family Medicine

## 2022-07-24 ENCOUNTER — Other Ambulatory Visit: Payer: Self-pay

## 2022-07-24 DIAGNOSIS — G4733 Obstructive sleep apnea (adult) (pediatric): Secondary | ICD-10-CM | POA: Diagnosis not present

## 2022-07-24 MED ORDER — BUPROPION HCL ER (XL) 150 MG PO TB24: ORAL_TABLET | ORAL | 1 refills | Status: DC

## 2022-07-24 NOTE — Telephone Encounter (Signed)
Hydrcodone 7.5 mg LOV: 05/06/22 Last Refill:06/23/22 Upcoming appt: none

## 2022-07-25 MED ORDER — HYDROCODONE-ACETAMINOPHEN 7.5-325 MG PO TABS: 1.0000 | ORAL_TABLET | Freq: Four times a day (QID) | ORAL | 0 refills | Status: DC | PRN

## 2022-07-25 NOTE — Telephone Encounter (Signed)
Pt is aware of refill

## 2022-07-27 ENCOUNTER — Other Ambulatory Visit: Payer: Self-pay | Admitting: Family Medicine

## 2022-08-15 ENCOUNTER — Other Ambulatory Visit: Payer: Self-pay | Admitting: Family Medicine

## 2022-08-15 NOTE — Telephone Encounter (Signed)
Testoserone 1,000 units  LOV: 03/02/22 Last Refill:06/09/22 Upcoming appt: none  The others are attached and the Meloxicam was denied in April for pt

## 2022-08-24 DIAGNOSIS — G4733 Obstructive sleep apnea (adult) (pediatric): Secondary | ICD-10-CM | POA: Diagnosis not present

## 2022-09-11 ENCOUNTER — Encounter: Payer: Self-pay | Admitting: Family Medicine

## 2022-09-12 MED ORDER — HYDROCODONE-ACETAMINOPHEN 7.5-325 MG PO TABS: 1.0000 | ORAL_TABLET | Freq: Four times a day (QID) | ORAL | 0 refills | Status: DC | PRN

## 2022-09-12 NOTE — Addendum Note (Signed)
Addended by: Sheliah Hatch on: 09/12/2022 07:24 AM   Modules accepted: Orders

## 2022-09-15 ENCOUNTER — Encounter: Payer: Self-pay | Admitting: Family Medicine

## 2022-09-15 MED ORDER — HYDROCODONE-ACETAMINOPHEN 10-325 MG PO TABS
1.0000 | ORAL_TABLET | Freq: Four times a day (QID) | ORAL | 0 refills | Status: AC | PRN
Start: 2022-09-15 — End: 2022-10-15

## 2022-09-24 DIAGNOSIS — G4733 Obstructive sleep apnea (adult) (pediatric): Secondary | ICD-10-CM | POA: Diagnosis not present

## 2022-10-02 ENCOUNTER — Encounter: Payer: Self-pay | Admitting: Family Medicine

## 2022-10-03 MED ORDER — SCOPOLAMINE 1 MG/3DAYS TD PT72: 1.0000 | MEDICATED_PATCH | TRANSDERMAL | 1 refills | Status: DC

## 2022-10-03 NOTE — Addendum Note (Signed)
Addended by: Sheliah Hatch on: 10/03/2022 03:41 PM   Modules accepted: Orders

## 2022-10-18 ENCOUNTER — Other Ambulatory Visit: Payer: Self-pay | Admitting: Family Medicine

## 2022-10-18 NOTE — Telephone Encounter (Signed)
Patient is requesting a refill of the following medications: Requested Prescriptions   Pending Prescriptions Disp Refills   HYDROcodone-acetaminophen (NORCO) 7.5-325 MG tablet 120 tablet 0    Sig: Take 1 tablet by mouth every 6 (six) hours as needed for moderate pain.    Date of patient request: 10/18/22 Last office visit: 03/02/22 Date of last refill: 09/02/22 Last refill amount: 120 Follow up time period per chart: 6 months

## 2022-10-19 MED ORDER — HYDROCODONE-ACETAMINOPHEN 7.5-325 MG PO TABS: 1.0000 | ORAL_TABLET | Freq: Four times a day (QID) | ORAL | 0 refills | Status: DC | PRN

## 2022-10-20 DIAGNOSIS — M2041 Other hammer toe(s) (acquired), right foot: Secondary | ICD-10-CM | POA: Diagnosis not present

## 2022-10-20 DIAGNOSIS — M2042 Other hammer toe(s) (acquired), left foot: Secondary | ICD-10-CM | POA: Diagnosis not present

## 2022-10-20 DIAGNOSIS — I872 Venous insufficiency (chronic) (peripheral): Secondary | ICD-10-CM | POA: Diagnosis not present

## 2022-10-20 DIAGNOSIS — E11628 Type 2 diabetes mellitus with other skin complications: Secondary | ICD-10-CM | POA: Diagnosis not present

## 2022-11-28 ENCOUNTER — Encounter: Payer: Self-pay | Admitting: Family Medicine

## 2022-11-29 MED ORDER — HYDROCODONE-ACETAMINOPHEN 7.5-325 MG PO TABS: 1.0000 | ORAL_TABLET | Freq: Four times a day (QID) | ORAL | 0 refills | Status: DC | PRN

## 2022-12-16 DIAGNOSIS — G4733 Obstructive sleep apnea (adult) (pediatric): Secondary | ICD-10-CM | POA: Diagnosis not present

## 2022-12-19 ENCOUNTER — Ambulatory Visit: Payer: BC Managed Care – PPO | Admitting: Family Medicine

## 2022-12-19 ENCOUNTER — Encounter: Payer: Self-pay | Admitting: Family Medicine

## 2022-12-19 VITALS — BP 128/68 | HR 98 | Temp 98.7°F | Ht 67.0 in | Wt 342.2 lb

## 2022-12-19 DIAGNOSIS — E1142 Type 2 diabetes mellitus with diabetic polyneuropathy: Secondary | ICD-10-CM | POA: Diagnosis not present

## 2022-12-19 DIAGNOSIS — M2550 Pain in unspecified joint: Secondary | ICD-10-CM

## 2022-12-19 DIAGNOSIS — Z114 Encounter for screening for human immunodeficiency virus [HIV]: Secondary | ICD-10-CM

## 2022-12-19 DIAGNOSIS — Z23 Encounter for immunization: Secondary | ICD-10-CM

## 2022-12-19 DIAGNOSIS — G8929 Other chronic pain: Secondary | ICD-10-CM

## 2022-12-19 DIAGNOSIS — I10 Essential (primary) hypertension: Secondary | ICD-10-CM

## 2022-12-19 DIAGNOSIS — Z6841 Body Mass Index (BMI) 40.0 and over, adult: Secondary | ICD-10-CM

## 2022-12-19 DIAGNOSIS — Z1159 Encounter for screening for other viral diseases: Secondary | ICD-10-CM

## 2022-12-19 DIAGNOSIS — E1169 Type 2 diabetes mellitus with other specified complication: Secondary | ICD-10-CM

## 2022-12-19 DIAGNOSIS — E785 Hyperlipidemia, unspecified: Secondary | ICD-10-CM

## 2022-12-19 LAB — MICROALBUMIN / CREATININE URINE RATIO
Creatinine,U: 47.5 mg/dL
Microalb Creat Ratio: 1.5 mg/g (ref 0.0–30.0)
Microalb, Ur: <0.7 mg/dL (ref 0.0–1.9)

## 2022-12-19 LAB — POCT URINE DRUG SCREEN
Morphine: POSITIVE
POC Amphetamine UR: NOT DETECTED
POC BENZODIAZEPINES UR: NOT DETECTED
POC Barbiturate UR: NOT DETECTED
POC Cocaine UR: NOT DETECTED
POC Ecstasy UR: NOT DETECTED
POC Marijuana UR: NOT DETECTED
POC Methadone UR: NOT DETECTED
POC Methamphetamine UR: NOT DETECTED
POC Opiate Ur: NOT DETECTED
POC Oxycodone UR: NOT DETECTED
POC PHENCYCLIDINE UR: NOT DETECTED
POC TRICYCLICS UR: NOT DETECTED
URINE TEMPERATURE: 94 [degF] (ref 90.0–100.0)

## 2022-12-19 MED ORDER — ALPRAZOLAM 0.5 MG PO TABS: 0.5000 mg | ORAL_TABLET | Freq: Two times a day (BID) | ORAL | 0 refills | Status: DC | PRN

## 2022-12-19 MED ORDER — FREESTYLE LIBRE 2 SENSOR MISC: 1.0000 | 3 refills | Status: DC

## 2022-12-19 MED ORDER — RYBELSUS 7 MG PO TABS: 7.0000 mg | ORAL_TABLET | Freq: Every day | ORAL | 1 refills | Status: DC

## 2022-12-19 NOTE — Assessment & Plan Note (Signed)
Chronic problem.  Unfortunately he has not followed up like he is supposed to.  Is not seeing Endo and has not been seen here in 10 months.  UTD on foot exam.  Reports UTD on eye exam.  Needs A1C and microalbumin.  He reports he is taking his medication but his prescription refill data would indicate otherwise.  Check labs.  Adjust meds prn

## 2022-12-19 NOTE — Assessment & Plan Note (Addendum)
Chronic problem.  On Lisinopril 2.5mg  daily w/ good control.  Currently asymptomatic.  Check labs due to ACE use but no anticipated med changes.

## 2022-12-19 NOTE — Progress Notes (Signed)
   Subjective:    Patient ID: Caleb Crook., male    DOB: 15-Dec-1965, 57 y.o.   MRN: [id number]  HPI DM- chronic problem.  Unfortunately pt has not been seen since Feb.  Current regimen is Rybelsus 7mg  daily, Glipizide 10mg  BID, Farxiga 10mg  daily.  Based on prescription data, pt has been out of these medications.  Pt reports UTD on eye exam, foot exam.  Due for microalbumin.  No numbness/tingling of hands/feet.    Hyperlipidemia- chronic problem, on Atorvastatin 10mg  daily, Fenofibrate 160mg  daily.  No abd pain, N/V.  HTN- chronic problem, on Lisinopril 2.5mg  daily.  No CP, SOB, HA's, visual changes  Obesity- weight is stable.  BMI 53.6   Review of Systems For ROS see HPI     Objective:   Physical Exam Vitals reviewed.  Constitutional:      General: He is not in acute distress.    Appearance: Normal appearance. He is well-developed. He is obese. He is not ill-appearing.  HENT:     Head: Normocephalic and atraumatic.  Eyes:     Extraocular Movements: Extraocular movements intact.     Conjunctiva/sclera: Conjunctivae normal.     Pupils: Pupils are equal, round, and reactive to light.  Neck:     Thyroid: No thyromegaly.  Cardiovascular:     Rate and Rhythm: Normal rate and regular rhythm.     Pulses: Normal pulses.     Heart sounds: Normal heart sounds. No murmur heard. Pulmonary:     Effort: Pulmonary effort is normal. No respiratory distress.     Breath sounds: Normal breath sounds.  Abdominal:     General: Bowel sounds are normal. There is no distension.     Palpations: Abdomen is soft.  Musculoskeletal:     Cervical back: Normal range of motion and neck supple.     Right lower leg: No edema.     Left lower leg: No edema.  Lymphadenopathy:     Cervical: No cervical adenopathy.  Skin:    General: Skin is warm and dry.  Neurological:     General: No focal deficit present.     Mental Status: He is alert and oriented to person, place, and time.     Cranial  Nerves: No cranial nerve deficit.  Psychiatric:        Mood and Affect: Mood normal.        Behavior: Behavior normal.           Assessment & Plan:

## 2022-12-19 NOTE — Patient Instructions (Addendum)
Schedule your complete physical in 6 months We'll notify you of your lab results and make any changes if needed Continue to work on healthy diet and regular exercise- you can do it! Call with any questoins or concerns Stay Safe!  Stay Healthy! Hang in there!!

## 2022-12-19 NOTE — Assessment & Plan Note (Signed)
Controlled substance agreement updated

## 2022-12-19 NOTE — Assessment & Plan Note (Signed)
Ongoing issue.  BMI and weight are stable.  Stressed need for low carb diet and regular physical activity.  Will follow.

## 2022-12-19 NOTE — Addendum Note (Signed)
Addended by: Sheliah Hatch on: 12/19/2022 08:06 PM   Modules accepted: Level of Service

## 2022-12-19 NOTE — Assessment & Plan Note (Signed)
Chronic problem.  Currently on Atorvastatin and Fenofibrate w/o difficulty.  Check labs.  Adjust meds prn

## 2022-12-22 ENCOUNTER — Other Ambulatory Visit: Payer: BC Managed Care – PPO

## 2022-12-22 DIAGNOSIS — E1142 Type 2 diabetes mellitus with diabetic polyneuropathy: Secondary | ICD-10-CM | POA: Diagnosis not present

## 2022-12-22 DIAGNOSIS — Z6841 Body Mass Index (BMI) 40.0 and over, adult: Secondary | ICD-10-CM | POA: Diagnosis not present

## 2022-12-22 NOTE — Addendum Note (Signed)
Addended by: Sheliah Hatch on: 12/22/2022 04:11 PM   Modules accepted: Orders

## 2022-12-23 LAB — CBC WITH DIFFERENTIAL/PLATELET
Absolute Lymphocytes: 1620 {cells}/uL (ref 850–3900)
Absolute Monocytes: 653 {cells}/uL (ref 200–950)
Basophils Absolute: 32 {cells}/uL (ref 0–200)
Basophils Relative: 0.6 %
Eosinophils Absolute: 103 {cells}/uL (ref 15–500)
Eosinophils Relative: 1.9 %
HCT: 49.1 % (ref 38.5–50.0)
Hemoglobin: 16.5 g/dL (ref 13.2–17.1)
MCH: 31.3 pg (ref 27.0–33.0)
MCHC: 33.6 g/dL (ref 32.0–36.0)
MCV: 93.2 fL (ref 80.0–100.0)
MPV: 9.7 fL (ref 7.5–12.5)
Monocytes Relative: 12.1 %
Neutro Abs: 2992 {cells}/uL (ref 1500–7800)
Neutrophils Relative %: 55.4 %
Platelets: 218 10*3/uL (ref 140–400)
RBC: 5.27 10*6/uL (ref 4.20–5.80)
RDW: 12 % (ref 11.0–15.0)
Total Lymphocyte: 30 %
WBC: 5.4 10*3/uL (ref 3.8–10.8)

## 2022-12-23 LAB — BASIC METABOLIC PANEL
BUN: 16 mg/dL (ref 7–25)
CO2: 19 mmol/L — ABNORMAL LOW (ref 20–32)
Calcium: 9.6 mg/dL (ref 8.6–10.3)
Chloride: 100 mmol/L (ref 98–110)
Creat: 1 mg/dL (ref 0.70–1.30)
Glucose, Bld: 254 mg/dL — ABNORMAL HIGH (ref 65–99)
Potassium: 4.1 mmol/L (ref 3.5–5.3)
Sodium: 134 mmol/L — ABNORMAL LOW (ref 135–146)

## 2022-12-23 LAB — HEPATIC FUNCTION PANEL
AG Ratio: 1.4 (calc) (ref 1.0–2.5)
ALT: 28 U/L (ref 9–46)
AST: 24 U/L (ref 10–35)
Albumin: 4 g/dL (ref 3.6–5.1)
Alkaline phosphatase (APISO): 61 U/L (ref 35–144)
Bilirubin, Direct: 0.1 mg/dL (ref 0.0–0.2)
Globulin: 2.8 g/dL (ref 1.9–3.7)
Indirect Bilirubin: 0.3 mg/dL (ref 0.2–1.2)
Total Bilirubin: 0.4 mg/dL (ref 0.2–1.2)
Total Protein: 6.8 g/dL (ref 6.1–8.1)

## 2022-12-23 LAB — HIV ANTIBODY (ROUTINE TESTING W REFLEX): HIV 1&2 Ab, 4th Generation: NONREACTIVE

## 2022-12-23 LAB — LIPID PANEL
Cholesterol: 143 mg/dL (ref <200)
HDL: 44 mg/dL (ref >=40)
LDL Cholesterol (Calc): 67 mg/dL
Non-HDL Cholesterol (Calc): 99 mg/dL (ref <130)
Total CHOL/HDL Ratio: 3.3 (calc) (ref <5.0)
Triglycerides: 275 mg/dL — ABNORMAL HIGH (ref <150)

## 2022-12-23 LAB — HEMOGLOBIN A1C
Hgb A1c MFr Bld: 8 %{Hb} — ABNORMAL HIGH (ref <5.7)
Mean Plasma Glucose: 183 mg/dL
eAG (mmol/L): 10.1 mmol/L

## 2022-12-23 LAB — VITAMIN D 25 HYDROXY (VIT D DEFICIENCY, FRACTURES): Vit D, 25-Hydroxy: 60 ng/mL (ref 30–100)

## 2022-12-23 LAB — TSH: TSH: 3.24 m[IU]/L (ref 0.40–4.50)

## 2022-12-23 LAB — HEPATITIS C ANTIBODY: Hepatitis C Ab: NONREACTIVE

## 2022-12-25 ENCOUNTER — Other Ambulatory Visit: Payer: Self-pay

## 2022-12-25 ENCOUNTER — Telehealth: Payer: Self-pay

## 2022-12-25 MED ORDER — FENOFIBRATE 160 MG PO TABS: 160.0000 mg | ORAL_TABLET | Freq: Every day | ORAL | 1 refills | Status: AC

## 2022-12-25 NOTE — Telephone Encounter (Signed)
-----   Message from Neena Rhymes sent at 12/25/2022  7:24 AM EST ----- Labs look good w/ exception of elevated sugar and much higher A1C.  Please make sure you are taking your Farxiga 10mg  daily, your Glipizide 10mg  daily, your Rybelsus 7mg  daily while paying attention to low carb/low sugar diet and regular physical activity.  Please let us know if any refills are needed.  Your Triglycerides (fatty part of blood) are also elevated.  Please make sure you are taking your Fenofibrate 160mg  daily.  Again, please let us know if refills are needed  Remainder of labs look good

## 2022-12-25 NOTE — Telephone Encounter (Signed)
Pt has been notified of labs Pt reports he took a pill and unsure what he took the day he came in office. This is why the drug screen showed Morphine was positive. Pt requested refill on Fenofibrate- this was sent in. He states he will call with any further refill request.

## 2023-01-04 ENCOUNTER — Encounter: Payer: Self-pay | Admitting: Family Medicine

## 2023-01-04 MED ORDER — HYDROCODONE-ACETAMINOPHEN 7.5-325 MG PO TABS: 1.0000 | ORAL_TABLET | Freq: Four times a day (QID) | ORAL | 0 refills | Status: DC | PRN

## 2023-01-15 DIAGNOSIS — G4733 Obstructive sleep apnea (adult) (pediatric): Secondary | ICD-10-CM | POA: Diagnosis not present

## 2023-01-16 ENCOUNTER — Encounter: Payer: BC Managed Care – PPO | Admitting: Family Medicine

## 2023-02-16 ENCOUNTER — Other Ambulatory Visit (HOSPITAL_COMMUNITY): Payer: Self-pay

## 2023-02-16 ENCOUNTER — Other Ambulatory Visit: Payer: Self-pay

## 2023-02-16 ENCOUNTER — Other Ambulatory Visit: Payer: Self-pay | Admitting: Internal Medicine

## 2023-02-16 ENCOUNTER — Other Ambulatory Visit: Payer: Self-pay | Admitting: Family Medicine

## 2023-02-16 MED ORDER — LORATADINE 10 MG PO TABS
10.0000 mg | ORAL_TABLET | Freq: Every day | ORAL | 1 refills | Status: AC
  Filled 2023-02-16: qty 30, 30d supply, fill #0

## 2023-02-16 MED ORDER — ALPRAZOLAM 0.5 MG PO TABS: 0.5000 mg | ORAL_TABLET | Freq: Two times a day (BID) | ORAL | 0 refills | Status: DC | PRN

## 2023-02-16 MED ORDER — HYDROCODONE-ACETAMINOPHEN 7.5-325 MG PO TABS: 1.0000 | ORAL_TABLET | Freq: Four times a day (QID) | ORAL | 0 refills | Status: DC | PRN

## 2023-02-16 NOTE — Telephone Encounter (Signed)
Requested Prescriptions   Pending Prescriptions Disp Refills   HYDROcodone-acetaminophen (NORCO) 7.5-325 MG tablet 120 tablet 0    Sig: Take 1 tablet by mouth every 6 (six) hours as needed for moderate pain (pain score 4-6).     Date of patient request: 02/16/2023 Last office visit: 12/19/2022 Upcoming visit: 02/16/2023 Date of last refill: 01/04/2023 Last refill amount: 120

## 2023-02-16 NOTE — Telephone Encounter (Signed)
Requested Prescriptions   Pending Prescriptions Disp Refills   ALPRAZolam (XANAX) 0.5 MG tablet 30 tablet 0    Sig: Take 1 tablet (0.5 mg total) by mouth 2 (two) times daily as needed for anxiety.     Date of patient request: 02/16/2023 Last office visit: 12/19/2022 Upcoming visit: 02/16/2023 Date of last refill: 12/29/2022 Last refill amount: 30

## 2023-02-20 ENCOUNTER — Other Ambulatory Visit (HOSPITAL_COMMUNITY): Payer: Self-pay

## 2023-02-20 ENCOUNTER — Other Ambulatory Visit: Payer: Self-pay

## 2023-02-20 ENCOUNTER — Telehealth: Payer: Self-pay

## 2023-02-20 ENCOUNTER — Encounter: Payer: Self-pay | Admitting: Pharmacist

## 2023-02-20 NOTE — Telephone Encounter (Signed)
 Pharmacy Patient Advocate Encounter  Received notification from [employer] that Prior Authorization for HYDROcodone -Acetaminophen  7.5-325MG  tablets has been APPROVED from 02/20/23 to 10/19/24. Ran test claim, Copay is $20. This test claim was processed through [facility]- copay amounts may vary at other pharmacies due to pharmacy/plan contracts, or as the patient moves through the different stages of their insurance plan.   PA #/Case ID/Reference #: [id number]

## 2023-02-23 ENCOUNTER — Other Ambulatory Visit (HOSPITAL_COMMUNITY): Payer: Self-pay

## 2023-02-23 ENCOUNTER — Other Ambulatory Visit: Payer: Self-pay

## 2023-03-27 ENCOUNTER — Encounter: Payer: Self-pay | Admitting: Family Medicine

## 2023-03-27 MED ORDER — HYDROCODONE-ACETAMINOPHEN 7.5-325 MG PO TABS: 1.0000 | ORAL_TABLET | Freq: Four times a day (QID) | ORAL | 0 refills | Status: DC | PRN

## 2023-04-19 ENCOUNTER — Other Ambulatory Visit: Payer: Self-pay | Admitting: Internal Medicine

## 2023-04-19 ENCOUNTER — Other Ambulatory Visit: Payer: Self-pay | Admitting: Family Medicine

## 2023-04-20 ENCOUNTER — Other Ambulatory Visit: Payer: Self-pay | Admitting: Family Medicine

## 2023-04-20 NOTE — Telephone Encounter (Signed)
 Requested Prescriptions   Pending Prescriptions Disp Refills   lisinopril (ZESTRIL) 2.5 MG tablet [Pharmacy Med Name: LISINOPRIL 2.5 MG TABLET] 90 tablet 1    Sig: TAKE 1 TABLET BY MOUTH DAILY. LAST REFILL W/O APPT   FLUoxetine (PROZAC) 40 MG capsule [Pharmacy Med Name: FLUOXETINE HCL 40 MG CAPSULE] 90 capsule 1    Sig: TAKE 1 CAPSULE (40 MG TOTAL) BY MOUTH DAILY. LAST REFILL W/O APPT   testosterone cypionate (DEPOTESTOTERONE CYPIONATE) 100 MG/ML injection [Pharmacy Med Name: TESTOSTERONE CYP 1,000 MG/10ML] 10 mL     Sig: INJECT 1 ML INTRAMUSCULARLY INTO THE MUSCLE EVERY 14 (FOURTEEN) DAYS.   meloxicam (MOBIC) 15 MG tablet [Pharmacy Med Name: MELOXICAM 15 MG TABLET] 90 tablet 0    Sig: TAKE 1 TABLET BY MOUTH EVERY DAY     Date of patient request: 04/20/2023 Last office visit: 12/19/2022 Upcoming visit: 06/18/2023 Date of last refill: 08/15/2023 Last refill amount: 90, 10mL

## 2023-04-20 NOTE — Telephone Encounter (Signed)
 Requested Prescriptions   Pending Prescriptions Disp Refills   ALPRAZolam (XANAX) 0.5 MG tablet [Pharmacy Med Name: ALPRAZOLAM 0.5 MG TABLET] 30 tablet 0    Sig: TAKE 1 TABLET BY MOUTH 2 TIMES DAILY AS NEEDED FOR ANXIETY.   buPROPion (WELLBUTRIN XL) 150 MG 24 hr tablet [Pharmacy Med Name: BUPROPION HCL XL 150 MG TABLET] 90 tablet 1    Sig: TAKE 1 TABLET (150 MG TOTAL) BY MOUTH DAILY. LAST REFILL WITHOUT APPOINTMENT   tamsulosin (FLOMAX) 0.4 MG CAPS capsule [Pharmacy Med Name: TAMSULOSIN HCL 0.4 MG CAPSULE] 90 capsule 1    Sig: TAKE 1 CAPSULE (0.4 MG TOTAL) BY MOUTH DAILY. LAST REFILL W/O APPT     Date of patient request: 04/20/2023 Last office visit: 12/19/2022 Upcoming visit: 06/18/2023 Date of last refill: 02/16/2023 Last refill amount: 30

## 2023-04-23 ENCOUNTER — Telehealth: Payer: Self-pay

## 2023-04-23 ENCOUNTER — Other Ambulatory Visit (HOSPITAL_COMMUNITY): Payer: Self-pay

## 2023-04-23 NOTE — Telephone Encounter (Signed)
 Pharmacy Patient Advocate Encounter   Received notification from CoverMyMeds that prior authorization for Rybelsus 7MG  tablets is required/requested.   Insurance verification completed.   The patient is insured through [employer] .   Per test claim: PA required; PA submitted to above mentioned insurance via CoverMyMeds Key/confirmation #/EOC [id number] Status is pending

## 2023-04-24 ENCOUNTER — Other Ambulatory Visit: Payer: Self-pay | Admitting: Family Medicine

## 2023-04-24 ENCOUNTER — Other Ambulatory Visit (HOSPITAL_COMMUNITY): Payer: Self-pay

## 2023-04-24 NOTE — Telephone Encounter (Signed)
 Pharmacy Patient Advocate Encounter  Received notification from [employer] that Prior Authorization for Rybelsus 7MG  tablets has been APPROVED from 04/24/23 to 04/23/24. Unable to obtain price due to refill too soon rejection, last fill date 04/24/23 next available fill date06/21/25   PA #/Case ID/Reference #: [id number]

## 2023-05-31 ENCOUNTER — Telehealth: Payer: Self-pay

## 2023-05-31 ENCOUNTER — Other Ambulatory Visit: Payer: Self-pay | Admitting: Family Medicine

## 2023-05-31 NOTE — Telephone Encounter (Signed)
 Scheduled 06/18/23

## 2023-05-31 NOTE — Telephone Encounter (Signed)
 Left vm

## 2023-06-07 ENCOUNTER — Encounter: Payer: Self-pay | Admitting: Family Medicine

## 2023-06-07 MED ORDER — HYDROCODONE-ACETAMINOPHEN 7.5-325 MG PO TABS: 1.0000 | ORAL_TABLET | Freq: Four times a day (QID) | ORAL | 0 refills | Status: DC | PRN

## 2023-06-07 NOTE — Telephone Encounter (Signed)
 Requested Prescriptions   Pending Prescriptions Disp Refills   HYDROcodone -acetaminophen  (NORCO) 7.5-325 MG tablet 120 tablet 0    Sig: Take 1 tablet by mouth every 6 (six) hours as needed for moderate pain (pain score 4-6).     Date of patient request: 06/07/2023 Last office visit: 12/19/2022 Upcoming visit: 06/18/2023 Date of last refill: 03/27/23 Last refill amount: 120

## 2023-06-18 ENCOUNTER — Encounter: Payer: Self-pay | Admitting: Family Medicine

## 2023-06-18 ENCOUNTER — Ambulatory Visit: Payer: Self-pay | Admitting: Family Medicine

## 2023-06-18 ENCOUNTER — Ambulatory Visit (INDEPENDENT_AMBULATORY_CARE_PROVIDER_SITE_OTHER): Payer: BC Managed Care – PPO | Admitting: Family Medicine

## 2023-06-18 VITALS — BP 124/70 | HR 58 | Temp 98.0°F | Ht 67.0 in | Wt 330.4 lb

## 2023-06-18 DIAGNOSIS — Z125 Encounter for screening for malignant neoplasm of prostate: Secondary | ICD-10-CM

## 2023-06-18 DIAGNOSIS — R7989 Other specified abnormal findings of blood chemistry: Secondary | ICD-10-CM

## 2023-06-18 DIAGNOSIS — Z0000 Encounter for general adult medical examination without abnormal findings: Secondary | ICD-10-CM | POA: Diagnosis not present

## 2023-06-18 DIAGNOSIS — E1142 Type 2 diabetes mellitus with diabetic polyneuropathy: Secondary | ICD-10-CM

## 2023-06-18 LAB — BASIC METABOLIC PANEL WITH GFR
BUN: 11 mg/dL (ref 6–23)
CO2: 23 meq/L (ref 19–32)
Calcium: 9.3 mg/dL (ref 8.4–10.5)
Chloride: 99 meq/L (ref 96–112)
Creatinine, Ser: 0.83 mg/dL (ref 0.40–1.50)
GFR: 98.03 mL/min (ref >60.00)
Glucose, Bld: 217 mg/dL — ABNORMAL HIGH (ref 70–99)
Potassium: 3.7 meq/L (ref 3.5–5.1)
Sodium: 133 meq/L — ABNORMAL LOW (ref 135–145)

## 2023-06-18 LAB — LIPID PANEL
Cholesterol: 117 mg/dL (ref 0–200)
HDL: 33.6 mg/dL — ABNORMAL LOW (ref >39.00)
LDL Cholesterol: 61 mg/dL (ref 0–99)
NonHDL: 82.93
Total CHOL/HDL Ratio: 3
Triglycerides: 110 mg/dL (ref 0.0–149.0)
VLDL: 22 mg/dL (ref 0.0–40.0)

## 2023-06-18 LAB — HEPATIC FUNCTION PANEL
ALT: 24 U/L (ref 0–53)
AST: 17 U/L (ref 0–37)
Albumin: 4.2 g/dL (ref 3.5–5.2)
Alkaline Phosphatase: 46 U/L (ref 39–117)
Bilirubin, Direct: 0.1 mg/dL (ref 0.0–0.3)
Total Bilirubin: 0.6 mg/dL (ref 0.2–1.2)
Total Protein: 6.5 g/dL (ref 6.0–8.3)

## 2023-06-18 LAB — CBC WITH DIFFERENTIAL/PLATELET
Basophils Absolute: 0 10*3/uL (ref 0.0–0.1)
Basophils Relative: 0.5 % (ref 0.0–3.0)
Eosinophils Absolute: 0.2 10*3/uL (ref 0.0–0.7)
Eosinophils Relative: 2.9 % (ref 0.0–5.0)
HCT: 45.5 % (ref 39.0–52.0)
Hemoglobin: 15.7 g/dL (ref 13.0–17.0)
Lymphocytes Relative: 37.6 % (ref 12.0–46.0)
Lymphs Abs: 2.2 10*3/uL (ref 0.7–4.0)
MCHC: 34.4 g/dL (ref 30.0–36.0)
MCV: 90.4 fl (ref 78.0–100.0)
Monocytes Absolute: 0.6 10*3/uL (ref 0.1–1.0)
Monocytes Relative: 9.4 % (ref 3.0–12.0)
Neutro Abs: 2.9 10*3/uL (ref 1.4–7.7)
Neutrophils Relative %: 49.6 % (ref 43.0–77.0)
Platelets: 266 10*3/uL (ref 150.0–400.0)
RBC: 5.03 Mil/uL (ref 4.22–5.81)
RDW: 12.4 % (ref 11.5–15.5)
WBC: 5.9 10*3/uL (ref 4.0–10.5)

## 2023-06-18 LAB — PSA: PSA: 0.12 ng/mL (ref 0.10–4.00)

## 2023-06-18 LAB — TESTOSTERONE: Testosterone: 223.33 ng/dL — ABNORMAL LOW (ref 300.00–890.00)

## 2023-06-18 LAB — TSH: TSH: 5.07 u[IU]/mL (ref 0.35–5.50)

## 2023-06-18 LAB — HEMOGLOBIN A1C: Hgb A1c MFr Bld: 11.8 % — ABNORMAL HIGH (ref 4.6–6.5)

## 2023-06-18 MED ORDER — TIRZEPATIDE 2.5 MG/0.5ML ~~LOC~~ SOAJ: 2.5000 mg | SUBCUTANEOUS | 1 refills | Status: DC

## 2023-06-18 NOTE — Assessment & Plan Note (Signed)
 Pt's PE WNL w/ exception of BMI.  12 lb unintentional weight loss is concerning for out of control sugar.  UTD on colonoscopy, Tdap, foot exam, microalbumin.  Due for eye exam- encouraged to schedule.  Declines PNA vaccine.  Check labs.  Anticipatory guidance provided.

## 2023-06-18 NOTE — Progress Notes (Signed)
   Subjective:    Patient ID: Caleb Galla., male    DOB: [birth year], 58 y.o.   MRN: [id number]  HPI CPE- due for eye exam, declines PNA.  UTD on foot exam, microalbumin, Tdap, colonoscopy  Patient Care Team    Relationship Specialty Notifications Start End  Jess Morita, MD PCP - General Family Medicine  09/24/15   Adelene Homer, [place] Consulting Physician Podiatry  09/24/15   Albert Huff, MD Consulting Physician Ophthalmology  09/24/15   Ara Knee, MD Referring Physician Neurology  06/01/16   [facility] [place], MD Attending Physician   12/19/22   Shamleffer, Julian Obey, MD Attending Physician Endocrinology  12/19/22     Health Maintenance  Topic Date Due   Pneumococcal Vaccine 76-42 Years old (2 of 2 - PCV) 11/23/2013   OPHTHALMOLOGY EXAM  09/09/2022   COVID-19 Vaccine (3 - 2024-25 season) 09/17/2022   HEMOGLOBIN A1C  06/22/2023   INFLUENZA VACCINE  08/17/2023   Diabetic kidney evaluation - Urine ACR  12/19/2023   FOOT EXAM  12/19/2023   Diabetic kidney evaluation - eGFR measurement  12/22/2023   DTaP/Tdap/Td (3 - Td or Tdap) 02/14/2026   Colonoscopy  04/21/2029   Hepatitis C Screening  Completed   HIV Screening  Completed   Zoster Vaccines- Shingrix   Completed   HPV VACCINES  Aged Out   Meningococcal B Vaccine  Aged Out      Review of Systems Patient reports no vision/hearing changes, anorexia, fever ,adenopathy, persistant/recurrent hoarseness, swallowing issues, chest pain, palpitations, edema, persistant/recurrent cough, hemoptysis, dyspnea (rest,exertional, paroxysmal nocturnal), gastrointestinal  bleeding (melena, rectal bleeding), abdominal pain, excessive heart burn, GU symptoms (dysuria, hematuria, voiding/incontinence issues) syncope, focal weakness, memory loss, numbness & tingling, skin/hair/nail changes, abnormal bruising/bleeding, musculoskeletal symptoms/signs.   + 12 lb weight loss    Objective:   Physical Exam General  Appearance:    Alert, cooperative, no distress, appears stated age, obese  Head:    Normocephalic, without obvious abnormality, atraumatic  Eyes:    PERRL, conjunctiva/corneas clear, EOM's intact both eyes       Ears:    Normal TM's and external ear canals, both ears  Nose:   Nares normal, septum midline, mucosa normal, no drainage   or sinus tenderness  Throat:   Lips, mucosa, and tongue normal; teeth and gums normal  Neck:   Supple, symmetrical, trachea midline, no adenopathy;       thyroid :  No enlargement/tenderness/nodules  Back:     Symmetric, no curvature, ROM normal, no CVA tenderness  Lungs:     Clear to auscultation bilaterally, respirations unlabored  Chest wall:    No tenderness or deformity  Heart:    Regular rate and rhythm, S1 and S2 normal, no murmur, rub   or gallop  Abdomen:     Soft, non-tender, bowel sounds active all four quadrants,    no masses, no organomegaly  Genitalia:    deferred  Rectal:    Extremities:   Extremities normal, atraumatic, no cyanosis or edema  Pulses:   2+ and symmetric all extremities  Skin:   Skin color, texture, turgor normal, no rashes or lesions  Lymph nodes:   Cervical, supraclavicular, and axillary nodes normal  Neurologic:   CNII-XII intact. Normal strength, sensation and reflexes      throughout          Assessment & Plan:

## 2023-06-18 NOTE — Patient Instructions (Signed)
 Follow up as needed or as scheduled We'll notify you of your lab results and make any changes if needed Once we see the labs, we can determine medication changes like Ozempic or Mounjaro Schedule your eye exam at your convenience Call with any questions or concerns Stay Safe!  Stay Healthy! Hang in there!!!

## 2023-06-19 ENCOUNTER — Telehealth: Payer: Self-pay

## 2023-06-19 ENCOUNTER — Other Ambulatory Visit (HOSPITAL_COMMUNITY): Payer: Self-pay

## 2023-06-19 NOTE — Telephone Encounter (Signed)
 Pharmacy Patient Advocate Encounter   Received notification from CoverMyMeds that prior authorization for Mounjaro 2.5 is required/requested.   Insurance verification completed.   The patient is insured through [employer] .   Per test claim: PA required; PA submitted to above mentioned insurance via CoverMyMeds Key/confirmation #/EOC [id number] Status is pending

## 2023-06-19 NOTE — Telephone Encounter (Signed)
 Pharmacy Patient Advocate Encounter  Received notification from [employer] that Prior Authorization for Mounjaro 2.5 has been APPROVED from 06/19/23 to 06/18/24. Ran test claim, Copay is $25.00. This test claim was processed through [facility]- copay amounts may vary at other pharmacies due to pharmacy/plan contracts, or as the patient moves through the different stages of their insurance plan.   PA #/Case ID/Reference #: [id number]

## 2023-06-20 NOTE — Telephone Encounter (Signed)
 Needs to stop Rybelsus .  Continue Glipizide 

## 2023-06-20 NOTE — Telephone Encounter (Signed)
 Pt is asking when and if he should stop the Rynelsus and Glipizide 

## 2023-06-21 ENCOUNTER — Telehealth (INDEPENDENT_AMBULATORY_CARE_PROVIDER_SITE_OTHER): Admitting: Family Medicine

## 2023-06-21 DIAGNOSIS — G8929 Other chronic pain: Secondary | ICD-10-CM

## 2023-06-21 DIAGNOSIS — F32A Depression, unspecified: Secondary | ICD-10-CM

## 2023-06-21 DIAGNOSIS — M545 Low back pain, unspecified: Secondary | ICD-10-CM

## 2023-06-21 DIAGNOSIS — F419 Anxiety disorder, unspecified: Secondary | ICD-10-CM | POA: Insufficient documentation

## 2023-06-21 MED ORDER — BUPROPION HCL ER (XL) 300 MG PO TB24
300.0000 mg | ORAL_TABLET | Freq: Every day | ORAL | 1 refills | Status: AC
Start: 2023-06-21 — End: ?

## 2023-06-21 NOTE — Progress Notes (Signed)
 Virtual Visit via Video   I connected with patient on 06/21/23 at  8:20 AM EDT by a video enabled telemedicine application and verified that I am speaking with the correct person using two identifiers.  Location patient: Home Location provider: [facility], Office Persons participating in the virtual visit: Patient, Provider, CMA (Tonya H)  I discussed the limitations of evaluation and management by telemedicine and the availability of in person appointments. The patient expressed understanding and agreed to proceed.  Subjective:   HPI:   Anxiety- chronic problem, on Wellbutrin  150mg  daily, Fluoxetine  40mg  daily, Alprazolam  PRN, and Trazodone  nightly.  Dad passed away, MIL passed, good friend passed, cat died.  Has suffered considerable losses.  Brother took out a loan on dad's house and is not able to pay it- needs to evict him.  Stress level is causing nausea.  He feels completely overwhelmed.  Back pain- ongoing issue.  Stress is worsening pain.  Is wondering if back injections would be helpful   ROS:   See pertinent positives and negatives per HPI.  Patient Active Problem List   Diagnosis Date Noted   Physical exam 09/01/2021   Type 2 diabetes mellitus with diabetic polyneuropathy, without long-term current use of insulin  (HCC) 12/15/2020   Hypogonadism in male 05/14/2020   Benign neoplasm of sigmoid colon    Moderate episode of recurrent major depressive disorder (HCC) 05/13/2018   Insomnia 03/07/2017   Hyperlipidemia associated with type 2 diabetes mellitus (HCC) 12/13/2016   HTN (hypertension) 12/13/2016   Colon cancer screening 06/01/2016   BPH (benign prostatic hyperplasia) 09/24/2015   Low testosterone  09/24/2015   Chronic joint pain 09/24/2015   Morbid obesity with BMI of 50.0-59.9, adult (HCC) 11/22/2012   Sleep apnea 11/22/2012    Social History   Tobacco Use   Smoking status: Former    Current packs/day: 0.00    Average packs/day: 1 pack/day for  7.0 years (7.0 ttl pk-yrs)    Types: Cigarettes    Start date: 09/22/2005    Quit date: 09/22/2012    Years since quitting: 10.7   Smokeless tobacco: Never  Substance Use Topics   Alcohol use: Yes    Comment: Rarely    Current Outpatient Medications:    ALPRAZolam  (XANAX ) 0.5 MG tablet, TAKE 1 TABLET BY MOUTH TWICE A DAY AS NEEDED FOR ANXIETY, Disp: 30 tablet, Rfl: 3   atorvastatin  (LIPITOR) 10 MG tablet, TAKE 1 TABLET BY MOUTH EVERY DAY, Disp: 90 tablet, Rfl: 1   buPROPion  (WELLBUTRIN  XL) 150 MG 24 hr tablet, TAKE 1 TABLET (150 MG TOTAL) BY MOUTH DAILY. LAST REFILL WITHOUT APPOINTMENT, Disp: 90 tablet, Rfl: 1   dicyclomine (BENTYL) 20 MG tablet, Take by mouth., Disp: , Rfl:    fenofibrate  160 MG tablet, Take 1 tablet (160 mg total) by mouth daily., Disp: 90 tablet, Rfl: 1   FLUoxetine  (PROZAC ) 40 MG capsule, TAKE 1 CAPSULE (40 MG TOTAL) BY MOUTH DAILY. LAST REFILL W/O APPT, Disp: 90 capsule, Rfl: 1   HYDROcodone -acetaminophen  (NORCO) 7.5-325 MG tablet, Take 1 tablet by mouth every 6 (six) hours as needed for moderate pain (pain score 4-6)., Disp: 120 tablet, Rfl: 0   lisinopril  (ZESTRIL ) 2.5 MG tablet, TAKE 1 TABLET BY MOUTH DAILY. LAST REFILL W/O APPT, Disp: 90 tablet, Rfl: 1   loratadine  (CLARITIN ) 10 MG tablet, Take 1 tablet (10 mg total) by mouth daily., Disp: 30 tablet, Rfl: 1   meloxicam  (MOBIC ) 15 MG tablet, TAKE 1 TABLET BY MOUTH EVERY DAY,  Disp: 90 tablet, Rfl: 0   nitroGLYCERIN  (NITROSTAT ) 0.4 MG SL tablet, PLACE 1 TABLET UNDER THE TONGUE EVERY 5 MINUTES AS NEEDED FOR CHEST PAIN., Disp: 75 tablet, Rfl: 1   tamsulosin  (FLOMAX ) 0.4 MG CAPS capsule, TAKE 1 CAPSULE (0.4 MG TOTAL) BY MOUTH DAILY. LAST REFILL W/O APPT, Disp: 90 capsule, Rfl: 1   testosterone  cypionate (DEPOTESTOTERONE CYPIONATE) 100 MG/ML injection, INJECT 1 ML INTRAMUSCULARLY INTO THE MUSCLE EVERY 14 (FOURTEEN) DAYS. (Patient not taking: Reported on 06/18/2023), Disp: 10 mL, Rfl: 1   tirzepatide  (MOUNJARO ) 2.5 MG/0.5ML Pen,  Inject 2.5 mg into the skin once a week., Disp: 2 mL, Rfl: 1   traZODone  (DESYREL ) 150 MG tablet, Take by mouth., Disp: , Rfl:   Allergies  Allergen Reactions   Penicillins Anaphylaxis    Did it involve swelling of the face/tongue/throat, SOB, or low BP? No Did it involve sudden or severe rash/hives, skin peeling, or any reaction on the inside of your mouth or nose? no Did you need to seek medical attention at a hospital or doctor's office? No When did it last happen?      Middle school If all above answers are "NO", may proceed with cephalosporin use.   Ambien [Zolpidem Tartrate] Other (See Comments)    nightmares   Cetirizine Other (See Comments)    Makes feel drunk   Gluten Meal Other (See Comments)    Diarrhea and headache   Lidocaine  Swelling and Other (See Comments)    Redness that spreads.  PATIENT CAN USE PRESERVATIVE FREE LIDOCAINE  Non preservative there is no problem   Pseudoephedrine Hcl Other (See Comments)    Makes feel drunk   Zolpidem Other (See Comments)    nightmares   Latex Itching, Swelling and Rash    Objective:   There were no vitals taken for this visit. AAOx3, NAD, obese NCAT, EOMI No obvious CN deficits Coloring WNL Pt is able to speak clearly, coherently without shortness of breath or increased work of breathing.  Thought process is linear.  Mood is appropriate.   Assessment and Plan:   Anxiety/Depression- deteriorated.  Lost his father.  Continues to have issues w/ his brother and finances.  Will increase wellbutrin  to 300mg  daily while continuing Fluoxetine , Trazodone , and Alprazolam .  Pt expressed understanding and is in agreement w/ plan.   Back pain- deteriorated.  Stress is causing his pain to worsen.  Wondering if injxns would help.  Refer to Ortho for complete evaluation.  Pt expressed understanding and is in agreement w/ plan.    Comer Greet, MD 06/21/2023

## 2023-06-21 NOTE — Telephone Encounter (Signed)
 Pt had an appointment this morning and got clarification

## 2023-06-28 ENCOUNTER — Encounter: Admitting: Family Medicine

## 2023-07-10 ENCOUNTER — Encounter: Payer: Self-pay | Admitting: Family Medicine

## 2023-07-10 NOTE — Telephone Encounter (Signed)
 Requested Prescriptions   Pending Prescriptions Disp Refills   HYDROcodone -acetaminophen  (NORCO) 7.5-325 MG tablet 120 tablet 0    Sig: Take 1 tablet by mouth every 6 (six) hours as needed for moderate pain (pain score 4-6).     Date of patient request: 07/10/2023 Last office visit: 06/18/2023 Upcoming visit: Visit date not found Date of last refill: 06/07/23 Last refill amount: 120

## 2023-07-11 MED ORDER — HYDROCODONE-ACETAMINOPHEN 7.5-325 MG PO TABS: 1.0000 | ORAL_TABLET | Freq: Four times a day (QID) | ORAL | 0 refills | Status: DC | PRN

## 2023-07-17 ENCOUNTER — Other Ambulatory Visit: Payer: Self-pay | Admitting: Family Medicine

## 2023-07-17 NOTE — Telephone Encounter (Signed)
 Needs PA for refill, please start this if not already! Thank you

## 2023-07-18 ENCOUNTER — Telehealth: Payer: Self-pay

## 2023-07-18 ENCOUNTER — Other Ambulatory Visit (HOSPITAL_COMMUNITY): Payer: Self-pay

## 2023-07-18 DIAGNOSIS — M545 Low back pain, unspecified: Secondary | ICD-10-CM | POA: Insufficient documentation

## 2023-07-18 NOTE — Telephone Encounter (Signed)
 Pharmacy Patient Advocate Encounter   Received notification from RX Request Messages that prior authorization for Zepbound  2.5ML/0.5ML is required/requested.   Insurance verification completed.   The patient is insured through [facility] .   Per insurance rep, Insurance covers 2.5MG  once per year, per patients plan. Patient will need to tartate up to the 5MG .

## 2023-07-18 NOTE — Telephone Encounter (Signed)
 Patient needs increased dose per insurance plan

## 2023-08-06 ENCOUNTER — Encounter: Payer: Self-pay | Admitting: Family Medicine

## 2023-08-06 MED ORDER — HYDROCODONE-ACETAMINOPHEN 7.5-325 MG PO TABS: 1.0000 | ORAL_TABLET | Freq: Four times a day (QID) | ORAL | 0 refills | Status: DC | PRN

## 2023-08-06 NOTE — Telephone Encounter (Signed)
 Patient is requesting a prescription for Glipizide . I do not see this medication on patients medication list. Please advise, thank you

## 2023-08-06 NOTE — Telephone Encounter (Signed)
 Requested Prescriptions   Pending Prescriptions Disp Refills   HYDROcodone -acetaminophen  (NORCO) 7.5-325 MG tablet 120 tablet 0    Sig: Take 1 tablet by mouth every 6 (six) hours as needed for moderate pain (pain score 4-6).    Date of patient request: 08/06/23 Last office visit: 06/18/2023 Upcoming visit: Visit date not found Date of last refill: 07/11/23 30 days Last refill amount: 120 tablets

## 2023-08-14 ENCOUNTER — Encounter: Payer: Self-pay | Admitting: Family Medicine

## 2023-08-15 ENCOUNTER — Other Ambulatory Visit: Payer: Self-pay | Admitting: Family

## 2023-08-15 NOTE — Telephone Encounter (Signed)
 Called [facility] to acquire more information for Hydrocodone  prescription. [facility] stated the patent was on 5mg  but in the pharmacy notes for this prescriptions was changed to 7.5mg  due to being on back order. This medication is no longer on back order so pharmacy is questioning if this needs to be changed back to the 5mg  or is the patient okay staying on the 7.5mg ?

## 2023-08-15 NOTE — Telephone Encounter (Signed)
 Please send this prescription over to the pharmacy as this is a controlled substance and I can not.

## 2023-08-20 DIAGNOSIS — M5416 Radiculopathy, lumbar region: Secondary | ICD-10-CM | POA: Insufficient documentation

## 2023-08-21 DIAGNOSIS — M47816 Spondylosis without myelopathy or radiculopathy, lumbar region: Secondary | ICD-10-CM | POA: Insufficient documentation

## 2023-08-31 MED ORDER — TIRZEPATIDE 7.5 MG/0.5ML ~~LOC~~ SOAJ: 7.5000 mg | SUBCUTANEOUS | 3 refills | Status: DC

## 2023-08-31 NOTE — Telephone Encounter (Signed)
 Patient okay with trying Mounjaro 

## 2023-08-31 NOTE — Telephone Encounter (Signed)
 Patient it requesting Rx for Glipizide , patient initially sent a message while you were out and Padonda elected to wait for PCP input

## 2023-10-03 ENCOUNTER — Encounter: Payer: Self-pay | Admitting: Family Medicine

## 2023-10-04 ENCOUNTER — Other Ambulatory Visit: Payer: Self-pay | Admitting: Family Medicine

## 2023-10-04 MED ORDER — HYDROCODONE-ACETAMINOPHEN 7.5-325 MG PO TABS: 1.0000 | ORAL_TABLET | Freq: Four times a day (QID) | ORAL | 0 refills | Status: DC | PRN

## 2023-10-04 NOTE — Addendum Note (Signed)
 Addended by: Serine Kea E on: 10/04/2023 03:36 PM   Modules accepted: Orders

## 2023-11-06 ENCOUNTER — Encounter: Payer: Self-pay | Admitting: Family Medicine

## 2023-11-06 MED ORDER — HYDROCODONE-ACETAMINOPHEN 7.5-325 MG PO TABS: 1.0000 | ORAL_TABLET | Freq: Four times a day (QID) | ORAL | 0 refills | Status: DC | PRN

## 2023-11-06 NOTE — Addendum Note (Signed)
 Addended by: Tawan Degroote E on: 11/06/2023 02:56 PM   Modules accepted: Orders

## 2023-11-24 ENCOUNTER — Other Ambulatory Visit: Payer: Self-pay | Admitting: Family Medicine

## 2023-12-18 ENCOUNTER — Other Ambulatory Visit: Payer: Self-pay | Admitting: Family Medicine

## 2023-12-18 ENCOUNTER — Other Ambulatory Visit: Payer: Self-pay

## 2023-12-18 MED ORDER — HYDROCODONE-ACETAMINOPHEN 7.5-325 MG PO TABS: 1.0000 | ORAL_TABLET | Freq: Four times a day (QID) | ORAL | 0 refills | Status: DC | PRN

## 2023-12-18 NOTE — Progress Notes (Signed)
 Requested Prescriptions   Pending Prescriptions Disp Refills   HYDROcodone -acetaminophen  (NORCO) 7.5-325 MG tablet 120 tablet 0    Sig: Take 1 tablet by mouth every 6 (six) hours as needed for moderate pain (pain score 4-6).     Date of patient request: 12/18/23 Last office visit: 06/21/23 Upcoming visit: 01/15/24 Date of last refill: 11/06/23 Last refill amount: 120

## 2023-12-18 NOTE — Telephone Encounter (Signed)
 Okay to change?  Product Backordered/Unavailable:FREESTYLE LIBRE 2 & 3 SENSORS ARE BEING DISCONTINUED BY [facility]. AND REPLACED BY LIBRE PLUS SENSORS. PLEASE CONSIDER THE ALTERNATIVE LISTED & SEND ERX IF APPROPRIATE FOR YOUR PATIENT.

## 2024-01-04 ENCOUNTER — Other Ambulatory Visit: Payer: Self-pay | Admitting: Family Medicine

## 2024-01-15 ENCOUNTER — Ambulatory Visit: Admitting: Family Medicine

## 2024-01-15 ENCOUNTER — Encounter: Payer: Self-pay | Admitting: Family Medicine

## 2024-01-15 VITALS — BP 114/62 | HR 68 | Temp 97.9°F | Resp 16 | Ht 67.0 in | Wt 315.0 lb

## 2024-01-15 DIAGNOSIS — E1142 Type 2 diabetes mellitus with diabetic polyneuropathy: Secondary | ICD-10-CM

## 2024-01-15 DIAGNOSIS — Z6841 Body Mass Index (BMI) 40.0 and over, adult: Secondary | ICD-10-CM | POA: Diagnosis not present

## 2024-01-15 DIAGNOSIS — Z7984 Long term (current) use of oral hypoglycemic drugs: Secondary | ICD-10-CM | POA: Diagnosis not present

## 2024-01-15 DIAGNOSIS — E1169 Type 2 diabetes mellitus with other specified complication: Secondary | ICD-10-CM

## 2024-01-15 DIAGNOSIS — E785 Hyperlipidemia, unspecified: Secondary | ICD-10-CM

## 2024-01-15 DIAGNOSIS — Z7985 Long-term (current) use of injectable non-insulin antidiabetic drugs: Secondary | ICD-10-CM

## 2024-01-15 LAB — LIPID PANEL
Cholesterol: 116 mg/dL (ref 28–200)
HDL: 31.1 mg/dL — ABNORMAL LOW
LDL Cholesterol: 64 mg/dL (ref 10–99)
NonHDL: 84.66
Total CHOL/HDL Ratio: 4
Triglycerides: 102 mg/dL (ref 10.0–149.0)
VLDL: 20.4 mg/dL (ref 0.0–40.0)

## 2024-01-15 LAB — CBC WITH DIFFERENTIAL/PLATELET
Basophils Absolute: 0 K/uL (ref 0.0–0.1)
Basophils Relative: 0.3 % (ref 0.0–3.0)
Eosinophils Absolute: 0.1 K/uL (ref 0.0–0.7)
Eosinophils Relative: 1.5 % (ref 0.0–5.0)
HCT: 45 % (ref 39.0–52.0)
Hemoglobin: 15.6 g/dL (ref 13.0–17.0)
Lymphocytes Relative: 23.2 % (ref 12.0–46.0)
Lymphs Abs: 1.4 K/uL (ref 0.7–4.0)
MCHC: 34.7 g/dL (ref 30.0–36.0)
MCV: 91 fl (ref 78.0–100.0)
Monocytes Absolute: 0.4 K/uL (ref 0.1–1.0)
Monocytes Relative: 7.3 % (ref 3.0–12.0)
Neutro Abs: 4 K/uL (ref 1.4–7.7)
Neutrophils Relative %: 67.7 % (ref 43.0–77.0)
Platelets: 270 K/uL (ref 150.0–400.0)
RBC: 4.94 Mil/uL (ref 4.22–5.81)
RDW: 12.3 % (ref 11.5–15.5)
WBC: 5.9 K/uL (ref 4.0–10.5)

## 2024-01-15 LAB — BASIC METABOLIC PANEL WITH GFR
BUN: 9 mg/dL (ref 6–23)
CO2: 27 meq/L (ref 19–32)
Calcium: 9.2 mg/dL (ref 8.4–10.5)
Chloride: 102 meq/L (ref 96–112)
Creatinine, Ser: 0.82 mg/dL (ref 0.40–1.50)
GFR: 98 mL/min
Glucose, Bld: 183 mg/dL — ABNORMAL HIGH (ref 70–99)
Potassium: 4 meq/L (ref 3.5–5.1)
Sodium: 137 meq/L (ref 135–145)

## 2024-01-15 LAB — HEPATIC FUNCTION PANEL
ALT: 16 U/L (ref 3–53)
AST: 16 U/L (ref 5–37)
Albumin: 4.3 g/dL (ref 3.5–5.2)
Alkaline Phosphatase: 40 U/L (ref 39–117)
Bilirubin, Direct: 0.2 mg/dL (ref 0.1–0.3)
Total Bilirubin: 0.6 mg/dL (ref 0.2–1.2)
Total Protein: 6.8 g/dL (ref 6.0–8.3)

## 2024-01-15 LAB — TSH: TSH: 1.39 u[IU]/mL (ref 0.35–5.50)

## 2024-01-15 LAB — MICROALBUMIN / CREATININE URINE RATIO
Creatinine,U: 324.5 mg/dL
Microalb Creat Ratio: 4 mg/g (ref 0.0–30.0)
Microalb, Ur: 1.3 mg/dL (ref 0.7–1.9)

## 2024-01-15 LAB — HEMOGLOBIN A1C: Hgb A1c MFr Bld: 8.6 % — ABNORMAL HIGH (ref 4.6–6.5)

## 2024-01-15 NOTE — Assessment & Plan Note (Signed)
 Chronic problem.  Currently on Mounjaro  7.5mg  weekly and Farxiga  10mg  daily.  Last A1C 11.8%.  Foot exam done today, microalbumin ordered.  Pt to schedule eye exam.  Check labs.  Adjust meds prn

## 2024-01-15 NOTE — Progress Notes (Signed)
" ° °  Subjective:    Patient ID: Caleb White., male    DOB: [birth year], 58 y.o.   MRN: [id number]  HPI DM- chronic problem, on Mounjaro  7.5mg  weekly, Farxiga  10mg  daily.  Last A1C 11.8%  Due for microalbumin, foot exam, eye exam.  No CP, SOB, HA's, visual changes, edema.  No numbness/tingling of hands/feet.  Hyperlipidemia- chronic problem, on Lipitor 10mg  daily and Fenofibrate  160mg  daily.  Denies abd pain, N/V  Obesity- pt is down 15 since last visit.  BMI 49.34   Review of Systems For ROS see HPI     Objective:   Physical Exam Vitals reviewed.  Constitutional:      General: He is not in acute distress.    Appearance: Normal appearance. He is well-developed. He is obese. He is not ill-appearing.  HENT:     Head: Normocephalic and atraumatic.  Eyes:     Extraocular Movements: Extraocular movements intact.     Conjunctiva/sclera: Conjunctivae normal.     Pupils: Pupils are equal, round, and reactive to light.  Neck:     Thyroid : No thyromegaly.  Cardiovascular:     Rate and Rhythm: Normal rate and regular rhythm.     Pulses: Normal pulses.     Heart sounds: Normal heart sounds. No murmur heard. Pulmonary:     Effort: Pulmonary effort is normal. No respiratory distress.     Breath sounds: Normal breath sounds.  Abdominal:     General: Bowel sounds are normal. There is no distension.     Palpations: Abdomen is soft.  Musculoskeletal:     Cervical back: Normal range of motion and neck supple.     Right lower leg: No edema.     Left lower leg: No edema.  Lymphadenopathy:     Cervical: No cervical adenopathy.  Skin:    General: Skin is warm and dry.  Neurological:     General: No focal deficit present.     Mental Status: He is alert and oriented to person, place, and time.     Cranial Nerves: No cranial nerve deficit.  Psychiatric:        Mood and Affect: Mood normal.        Behavior: Behavior normal.           Assessment & Plan:    "

## 2024-01-15 NOTE — Patient Instructions (Signed)
 Follow up in 3-4 months to recheck sugar We'll notify you of your lab results and make any changes if needed Continue to work on healthy diet and regular exercise- you're doing great!  Down 15 lbs! Call with any questions or concerns Stay Safe!  Stay Healthy! Happy New Year!!!

## 2024-01-15 NOTE — Assessment & Plan Note (Signed)
 Ongoing issue.  Pt is down 15 lbs since last visit.  Applauded his efforts.  BMI now 49.34  Encouraged low carb diet and regular exercise.  Will follow.

## 2024-01-15 NOTE — Assessment & Plan Note (Signed)
 Chronic problem.  On Lipitor 10mg  daily and Fenofibrate  160mg  daily w/o difficulty.  Check labs.  Adjust meds prn

## 2024-01-16 ENCOUNTER — Ambulatory Visit: Payer: Self-pay | Admitting: Family Medicine

## 2024-01-16 MED ORDER — TIRZEPATIDE 10 MG/0.5ML ~~LOC~~ SOAJ: 10.0000 mg | SUBCUTANEOUS | 1 refills | Status: AC

## 2024-02-11 ENCOUNTER — Encounter: Payer: Self-pay | Admitting: Family Medicine

## 2024-02-12 MED ORDER — HYDROCODONE-ACETAMINOPHEN 7.5-325 MG PO TABS: 1.0000 | ORAL_TABLET | Freq: Four times a day (QID) | ORAL | 0 refills | Status: AC | PRN

## 2024-02-12 NOTE — Telephone Encounter (Signed)
 Requested Prescriptions   Pending Prescriptions Disp Refills   HYDROcodone -acetaminophen  (NORCO) 7.5-325 MG tablet 120 tablet 0    Sig: Take 1 tablet by mouth every 6 (six) hours as needed for moderate pain (pain score 4-6).     Date of patient request: 02/12/24 Last office visit: 01/15/2024 Upcoming visit: 04/25/2024 Date of last refill: 12/18/23 Last refill amount: 120

## 2024-04-25 ENCOUNTER — Ambulatory Visit: Admitting: Family Medicine
